# Patient Record
Sex: Male | Born: 1955 | Race: White | Hispanic: No | Marital: Married | Smoking: Former smoker
Health system: Southern US, Community
[De-identification: ages and names within clinical notes are randomized; demographics above are authoritative.]

## PROBLEM LIST (undated history)

## (undated) DIAGNOSIS — A6920 Lyme disease, unspecified: Secondary | ICD-10-CM

## (undated) DIAGNOSIS — K589 Irritable bowel syndrome without diarrhea: Secondary | ICD-10-CM

## (undated) DIAGNOSIS — I499 Cardiac arrhythmia, unspecified: Secondary | ICD-10-CM

## (undated) DIAGNOSIS — J45909 Unspecified asthma, uncomplicated: Secondary | ICD-10-CM

## (undated) DIAGNOSIS — E785 Hyperlipidemia, unspecified: Secondary | ICD-10-CM

## (undated) DIAGNOSIS — E559 Vitamin D deficiency, unspecified: Secondary | ICD-10-CM

## (undated) DIAGNOSIS — N419 Inflammatory disease of prostate, unspecified: Secondary | ICD-10-CM

## (undated) DIAGNOSIS — F419 Anxiety disorder, unspecified: Secondary | ICD-10-CM

## (undated) DIAGNOSIS — N411 Chronic prostatitis: Secondary | ICD-10-CM

## (undated) DIAGNOSIS — I10 Essential (primary) hypertension: Secondary | ICD-10-CM

## (undated) DIAGNOSIS — M5412 Radiculopathy, cervical region: Secondary | ICD-10-CM

## (undated) DIAGNOSIS — K648 Other hemorrhoids: Secondary | ICD-10-CM

## (undated) DIAGNOSIS — I2510 Atherosclerotic heart disease of native coronary artery without angina pectoris: Secondary | ICD-10-CM

## (undated) DIAGNOSIS — C801 Malignant (primary) neoplasm, unspecified: Secondary | ICD-10-CM

## (undated) DIAGNOSIS — F319 Bipolar disorder, unspecified: Secondary | ICD-10-CM

## (undated) DIAGNOSIS — B009 Herpesviral infection, unspecified: Secondary | ICD-10-CM

## (undated) DIAGNOSIS — F329 Major depressive disorder, single episode, unspecified: Secondary | ICD-10-CM

## (undated) DIAGNOSIS — G4733 Obstructive sleep apnea (adult) (pediatric): Secondary | ICD-10-CM

## (undated) DIAGNOSIS — K5790 Diverticulosis of intestine, part unspecified, without perforation or abscess without bleeding: Secondary | ICD-10-CM

## (undated) DIAGNOSIS — T4145XA Adverse effect of unspecified anesthetic, initial encounter: Secondary | ICD-10-CM

## (undated) DIAGNOSIS — G9332 Myalgic encephalomyelitis/chronic fatigue syndrome: Secondary | ICD-10-CM

## (undated) DIAGNOSIS — R0789 Other chest pain: Secondary | ICD-10-CM

## (undated) DIAGNOSIS — H9190 Unspecified hearing loss, unspecified ear: Secondary | ICD-10-CM

## (undated) DIAGNOSIS — Z8739 Personal history of other diseases of the musculoskeletal system and connective tissue: Secondary | ICD-10-CM

## (undated) DIAGNOSIS — G4730 Sleep apnea, unspecified: Secondary | ICD-10-CM

## (undated) DIAGNOSIS — Z86010 Personal history of colonic polyps: Secondary | ICD-10-CM

## (undated) DIAGNOSIS — R519 Headache, unspecified: Secondary | ICD-10-CM

## (undated) DIAGNOSIS — R011 Cardiac murmur, unspecified: Secondary | ICD-10-CM

## (undated) DIAGNOSIS — Z9889 Other specified postprocedural states: Secondary | ICD-10-CM

## (undated) DIAGNOSIS — J309 Allergic rhinitis, unspecified: Secondary | ICD-10-CM

## (undated) DIAGNOSIS — R55 Syncope and collapse: Secondary | ICD-10-CM

## (undated) DIAGNOSIS — E7849 Other hyperlipidemia: Secondary | ICD-10-CM

## (undated) DIAGNOSIS — F32A Depression, unspecified: Secondary | ICD-10-CM

## (undated) DIAGNOSIS — T8859XA Other complications of anesthesia, initial encounter: Secondary | ICD-10-CM

## (undated) DIAGNOSIS — M436 Torticollis: Secondary | ICD-10-CM

## (undated) DIAGNOSIS — R112 Nausea with vomiting, unspecified: Secondary | ICD-10-CM

## (undated) DIAGNOSIS — K219 Gastro-esophageal reflux disease without esophagitis: Secondary | ICD-10-CM

## (undated) DIAGNOSIS — E291 Testicular hypofunction: Secondary | ICD-10-CM

## (undated) DIAGNOSIS — E7800 Pure hypercholesterolemia, unspecified: Secondary | ICD-10-CM

## (undated) DIAGNOSIS — G8929 Other chronic pain: Secondary | ICD-10-CM

## (undated) DIAGNOSIS — K644 Residual hemorrhoidal skin tags: Secondary | ICD-10-CM

## (undated) DIAGNOSIS — R5382 Chronic fatigue, unspecified: Secondary | ICD-10-CM

## (undated) DIAGNOSIS — T7840XA Allergy, unspecified, initial encounter: Secondary | ICD-10-CM

## (undated) DIAGNOSIS — M1990 Unspecified osteoarthritis, unspecified site: Secondary | ICD-10-CM

## (undated) DIAGNOSIS — R51 Headache: Secondary | ICD-10-CM

## (undated) DIAGNOSIS — D126 Benign neoplasm of colon, unspecified: Secondary | ICD-10-CM

## (undated) HISTORY — DX: Headache, unspecified: R51.9

## (undated) HISTORY — DX: Other chest pain: R07.89

## (undated) HISTORY — DX: Anxiety disorder, unspecified: F41.9

## (undated) HISTORY — DX: Irritable bowel syndrome, unspecified: K58.9

## (undated) HISTORY — PX: UPPER GASTROINTESTINAL ENDOSCOPY: SHX188

## (undated) HISTORY — DX: Atherosclerotic heart disease of native coronary artery without angina pectoris: I25.10

## (undated) HISTORY — DX: Other hyperlipidemia: E78.49

## (undated) HISTORY — DX: Inflammatory disease of prostate, unspecified: N41.9

## (undated) HISTORY — DX: Benign neoplasm of colon, unspecified: D12.6

## (undated) HISTORY — DX: Headache: R51

## (undated) HISTORY — DX: Unspecified osteoarthritis, unspecified site: M19.90

## (undated) HISTORY — DX: Chronic prostatitis: N41.1

## (undated) HISTORY — DX: Diverticulosis of intestine, part unspecified, without perforation or abscess without bleeding: K57.90

## (undated) HISTORY — DX: Other chronic pain: G89.29

## (undated) HISTORY — PX: TENNIS ELBOW RELEASE/NIRSCHEL PROCEDURE: SHX6651

## (undated) HISTORY — DX: Testicular hypofunction: E29.1

## (undated) HISTORY — DX: Herpesviral infection, unspecified: B00.9

## (undated) HISTORY — DX: Residual hemorrhoidal skin tags: K64.4

## (undated) HISTORY — DX: Sleep apnea, unspecified: G47.30

## (undated) HISTORY — DX: Depression, unspecified: F32.A

## (undated) HISTORY — PX: ESOPHAGOGASTRODUODENOSCOPY: SHX1529

## (undated) HISTORY — DX: Other hemorrhoids: K64.8

## (undated) HISTORY — DX: Allergic rhinitis, unspecified: J30.9

## (undated) HISTORY — DX: Lyme disease, unspecified: A69.20

## (undated) HISTORY — DX: Major depressive disorder, single episode, unspecified: F32.9

## (undated) HISTORY — DX: Unspecified asthma, uncomplicated: J45.909

## (undated) HISTORY — DX: Vitamin D deficiency, unspecified: E55.9

## (undated) HISTORY — DX: Radiculopathy, cervical region: M54.12

## (undated) HISTORY — DX: Gastro-esophageal reflux disease without esophagitis: K21.9

## (undated) HISTORY — DX: Chronic fatigue, unspecified: R53.82

## (undated) HISTORY — DX: Cardiac murmur, unspecified: R01.1

## (undated) HISTORY — DX: Nausea with vomiting, unspecified: R11.2

## (undated) HISTORY — PX: NASAL POLYP EXCISION: SHX2068

## (undated) HISTORY — DX: Essential (primary) hypertension: I10

## (undated) HISTORY — DX: Other specified postprocedural states: R11.2

## (undated) HISTORY — DX: Obstructive sleep apnea (adult) (pediatric): G47.33

## (undated) HISTORY — PX: BACK SURGERY: SHX140

## (undated) HISTORY — PX: COLONOSCOPY: SHX174

## (undated) HISTORY — PX: TONSILLECTOMY: SUR1361

## (undated) HISTORY — PX: MEDIAL PARTIAL KNEE REPLACEMENT: SHX5965

## (undated) HISTORY — DX: Allergy, unspecified, initial encounter: T78.40XA

## (undated) HISTORY — DX: Myalgic encephalomyelitis/chronic fatigue syndrome: G93.32

## (undated) HISTORY — DX: Bipolar disorder, unspecified: F31.9

## (undated) HISTORY — DX: Personal history of other diseases of the musculoskeletal system and connective tissue: Z87.39

## (undated) HISTORY — DX: Personal history of colonic polyps: Z86.010

## (undated) HISTORY — DX: Pure hypercholesterolemia, unspecified: E78.00

## (undated) HISTORY — DX: Syncope and collapse: R55

## (undated) HISTORY — DX: Other specified postprocedural states: Z98.890

## (undated) HISTORY — PX: CARDIAC CATHETERIZATION: SHX172

## (undated) HISTORY — PX: COLONOSCOPY W/ BIOPSIES: SHX1374

## (undated) HISTORY — PX: POLYPECTOMY: SHX149

## (undated) HISTORY — PX: HEMORRHOID BANDING: SHX5850

## (undated) HISTORY — DX: Hyperlipidemia, unspecified: E78.5

---
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
## 1988-03-07 HISTORY — PX: UVULOPALATOPHARYNGOPLASTY: SHX827

## 1992-09-24 ENCOUNTER — Encounter (INDEPENDENT_AMBULATORY_CARE_PROVIDER_SITE_OTHER): Payer: Self-pay | Admitting: Gastroenterology

## 1997-07-17 ENCOUNTER — Other Ambulatory Visit: Admission: RE | Admit: 1997-07-17 | Discharge: 1997-07-17 | Payer: Self-pay | Admitting: *Deleted

## 1997-11-18 ENCOUNTER — Ambulatory Visit: Admission: RE | Admit: 1997-11-18 | Discharge: 1997-11-18 | Payer: Self-pay | Admitting: *Deleted

## 1998-06-11 ENCOUNTER — Observation Stay (HOSPITAL_COMMUNITY): Admission: EM | Admit: 1998-06-11 | Discharge: 1998-06-12 | Payer: Self-pay | Admitting: Emergency Medicine

## 1998-09-24 ENCOUNTER — Inpatient Hospital Stay (HOSPITAL_COMMUNITY): Admission: AD | Admit: 1998-09-24 | Discharge: 1998-09-25 | Payer: Self-pay | Admitting: *Deleted

## 1998-11-24 ENCOUNTER — Ambulatory Visit: Admission: RE | Admit: 1998-11-24 | Discharge: 1998-11-24 | Payer: Self-pay | Admitting: *Deleted

## 1999-03-08 DIAGNOSIS — D126 Benign neoplasm of colon, unspecified: Secondary | ICD-10-CM

## 1999-03-08 HISTORY — DX: Benign neoplasm of colon, unspecified: D12.6

## 1999-04-01 ENCOUNTER — Encounter (INDEPENDENT_AMBULATORY_CARE_PROVIDER_SITE_OTHER): Payer: Self-pay | Admitting: Specialist

## 1999-04-01 ENCOUNTER — Ambulatory Visit (HOSPITAL_COMMUNITY): Admission: RE | Admit: 1999-04-01 | Discharge: 1999-04-01 | Payer: Self-pay | Admitting: Gastroenterology

## 2002-12-03 ENCOUNTER — Encounter: Admission: RE | Admit: 2002-12-03 | Discharge: 2002-12-03 | Payer: Self-pay | Admitting: Otolaryngology

## 2002-12-03 ENCOUNTER — Encounter: Payer: Self-pay | Admitting: Otolaryngology

## 2003-11-21 ENCOUNTER — Encounter: Admission: RE | Admit: 2003-11-21 | Discharge: 2003-11-21 | Payer: Self-pay | Admitting: Internal Medicine

## 2003-12-16 ENCOUNTER — Encounter: Admission: RE | Admit: 2003-12-16 | Discharge: 2003-12-16 | Payer: Self-pay | Admitting: Otolaryngology

## 2004-08-06 ENCOUNTER — Ambulatory Visit: Payer: Self-pay | Admitting: Gastroenterology

## 2004-08-17 ENCOUNTER — Ambulatory Visit: Payer: Self-pay | Admitting: Gastroenterology

## 2004-08-17 ENCOUNTER — Encounter (INDEPENDENT_AMBULATORY_CARE_PROVIDER_SITE_OTHER): Payer: Self-pay | Admitting: Specialist

## 2006-12-04 ENCOUNTER — Ambulatory Visit: Payer: Self-pay | Admitting: Internal Medicine

## 2007-05-18 DIAGNOSIS — E785 Hyperlipidemia, unspecified: Secondary | ICD-10-CM

## 2007-05-18 DIAGNOSIS — F3289 Other specified depressive episodes: Secondary | ICD-10-CM

## 2007-05-18 DIAGNOSIS — J309 Allergic rhinitis, unspecified: Secondary | ICD-10-CM | POA: Insufficient documentation

## 2007-05-18 DIAGNOSIS — K219 Gastro-esophageal reflux disease without esophagitis: Secondary | ICD-10-CM | POA: Insufficient documentation

## 2007-05-18 DIAGNOSIS — Z860100 Personal history of colon polyps, unspecified: Secondary | ICD-10-CM

## 2007-05-18 DIAGNOSIS — R519 Headache, unspecified: Secondary | ICD-10-CM | POA: Insufficient documentation

## 2007-05-18 DIAGNOSIS — K589 Irritable bowel syndrome without diarrhea: Secondary | ICD-10-CM

## 2007-05-18 DIAGNOSIS — F329 Major depressive disorder, single episode, unspecified: Secondary | ICD-10-CM | POA: Insufficient documentation

## 2007-05-18 DIAGNOSIS — R51 Headache: Secondary | ICD-10-CM

## 2007-05-18 DIAGNOSIS — J45909 Unspecified asthma, uncomplicated: Secondary | ICD-10-CM

## 2007-05-18 DIAGNOSIS — N411 Chronic prostatitis: Secondary | ICD-10-CM

## 2007-05-18 HISTORY — DX: Hyperlipidemia, unspecified: E78.5

## 2007-05-18 HISTORY — DX: Gastro-esophageal reflux disease without esophagitis: K21.9

## 2007-05-18 HISTORY — DX: Personal history of colonic polyps: Z86.010

## 2007-05-18 HISTORY — DX: Personal history of colon polyps, unspecified: Z86.0100

## 2007-05-18 HISTORY — DX: Unspecified asthma, uncomplicated: J45.909

## 2007-05-18 HISTORY — DX: Chronic prostatitis: N41.1

## 2007-05-18 HISTORY — DX: Headache: R51

## 2007-05-18 HISTORY — DX: Allergic rhinitis, unspecified: J30.9

## 2007-05-18 HISTORY — DX: Irritable bowel syndrome, unspecified: K58.9

## 2007-05-18 HISTORY — DX: Other specified depressive episodes: F32.89

## 2007-05-18 HISTORY — DX: Major depressive disorder, single episode, unspecified: F32.9

## 2007-10-16 ENCOUNTER — Ambulatory Visit: Payer: Self-pay | Admitting: Internal Medicine

## 2007-12-11 ENCOUNTER — Ambulatory Visit: Payer: Self-pay | Admitting: Internal Medicine

## 2007-12-27 ENCOUNTER — Ambulatory Visit: Payer: Self-pay | Admitting: Internal Medicine

## 2007-12-27 ENCOUNTER — Encounter: Payer: Self-pay | Admitting: Internal Medicine

## 2007-12-28 ENCOUNTER — Telehealth: Payer: Self-pay | Admitting: Internal Medicine

## 2007-12-30 ENCOUNTER — Encounter: Payer: Self-pay | Admitting: Internal Medicine

## 2008-05-05 DIAGNOSIS — R55 Syncope and collapse: Secondary | ICD-10-CM

## 2008-05-05 HISTORY — DX: Syncope and collapse: R55

## 2008-05-05 HISTORY — PX: LEFT HEART CATH AND CORONARY ANGIOGRAPHY: CATH118249

## 2008-05-05 HISTORY — PX: CARDIAC CATHETERIZATION: SHX172

## 2008-05-14 ENCOUNTER — Inpatient Hospital Stay (HOSPITAL_BASED_OUTPATIENT_CLINIC_OR_DEPARTMENT_OTHER): Admission: RE | Admit: 2008-05-14 | Discharge: 2008-05-14 | Payer: Self-pay | Admitting: Cardiovascular Disease

## 2008-08-14 ENCOUNTER — Encounter: Payer: Self-pay | Admitting: Internal Medicine

## 2009-09-04 HISTORY — PX: TRANSTHORACIC ECHOCARDIOGRAM: SHX275

## 2010-03-24 ENCOUNTER — Ambulatory Visit: Payer: Self-pay | Admitting: Cardiovascular Disease

## 2010-03-28 ENCOUNTER — Encounter: Payer: Self-pay | Admitting: Internal Medicine

## 2010-05-27 ENCOUNTER — Encounter: Payer: Self-pay | Admitting: Internal Medicine

## 2010-06-25 ENCOUNTER — Other Ambulatory Visit: Payer: Self-pay | Admitting: *Deleted

## 2010-06-25 DIAGNOSIS — E7800 Pure hypercholesterolemia, unspecified: Secondary | ICD-10-CM

## 2010-07-07 ENCOUNTER — Encounter: Payer: Self-pay | Admitting: Cardiovascular Disease

## 2010-07-07 DIAGNOSIS — E785 Hyperlipidemia, unspecified: Secondary | ICD-10-CM | POA: Insufficient documentation

## 2010-07-07 DIAGNOSIS — E7800 Pure hypercholesterolemia, unspecified: Secondary | ICD-10-CM | POA: Insufficient documentation

## 2010-07-07 DIAGNOSIS — R55 Syncope and collapse: Secondary | ICD-10-CM | POA: Insufficient documentation

## 2010-07-07 DIAGNOSIS — F319 Bipolar disorder, unspecified: Secondary | ICD-10-CM | POA: Insufficient documentation

## 2010-07-07 DIAGNOSIS — G4730 Sleep apnea, unspecified: Secondary | ICD-10-CM | POA: Insufficient documentation

## 2010-07-07 DIAGNOSIS — R0789 Other chest pain: Secondary | ICD-10-CM | POA: Insufficient documentation

## 2010-07-14 ENCOUNTER — Ambulatory Visit (INDEPENDENT_AMBULATORY_CARE_PROVIDER_SITE_OTHER): Payer: BC Managed Care – PPO | Admitting: Cardiovascular Disease

## 2010-07-14 ENCOUNTER — Other Ambulatory Visit (INDEPENDENT_AMBULATORY_CARE_PROVIDER_SITE_OTHER): Payer: BC Managed Care – PPO | Admitting: *Deleted

## 2010-07-14 ENCOUNTER — Encounter: Payer: Self-pay | Admitting: Cardiovascular Disease

## 2010-07-14 ENCOUNTER — Telehealth: Payer: Self-pay | Admitting: Cardiovascular Disease

## 2010-07-14 VITALS — BP 122/82 | HR 70 | Wt 211.0 lb

## 2010-07-14 DIAGNOSIS — E785 Hyperlipidemia, unspecified: Secondary | ICD-10-CM

## 2010-07-14 DIAGNOSIS — E7800 Pure hypercholesterolemia, unspecified: Secondary | ICD-10-CM

## 2010-07-14 NOTE — Telephone Encounter (Signed)
He told me as he was checking out today that he is no longer on the VYVANSE.

## 2010-07-14 NOTE — Telephone Encounter (Signed)
Med list corrected. Alfonso Ramus RN

## 2010-07-14 NOTE — Assessment & Plan Note (Signed)
He recently had some lab work at Dr. Alver Fisher office. We'll have Dr. Clelia Croft fax these levels to [place]. I'll see [name] again in one year.

## 2010-07-14 NOTE — Progress Notes (Signed)
Aaron Stout")  Date of Birth  1955-04-22 [facility] [address]     [address] [place], [place]  [address] [phone]  Fax  [phone]  History of Present Illness:  Feeling well. No chest pain or dyspnea.  Feels fatigued.  Current Outpatient Prescriptions on File Prior to Visit  Medication Sig Dispense Refill  . acyclovir (ZOVIRAX) 400 MG tablet Take 400 mg by mouth 2 (two) times daily.        Marland Kitchen aspirin 325 MG tablet Take 325 mg by mouth daily.       Marland Kitchen atorvastatin (LIPITOR) 20 MG tablet Take 20 mg by mouth daily.        Marland Kitchen azelastine (ASTELIN) 137 MCG/SPRAY nasal spray 1 spray by Nasal route 2 (two) times daily. Use in each nostril as directed       . fexofenadine (ALLEGRA) 180 MG tablet Take 180 mg by mouth daily.        . fish oil-omega-3 fatty acids 1000 MG capsule Take 1,200 mg by mouth 2 (two) times daily. 2 BID       . fluticasone (FLONASE) 50 MCG/ACT nasal spray 2 sprays by Nasal route 2 (two) times daily.        . Multiple Vitamin (MULTIVITAMIN) tablet Take 1 tablet by mouth daily.        Marland Kitchen omeprazole-sodium bicarbonate (ZEGERID) 40-1100 MG per capsule Take 1 capsule by mouth 2 (two) times daily.        Marland Kitchen PERPHENAZINE PO Take 8 mg by mouth daily.       . QUEtiapine (SEROQUEL) 50 MG tablet Take 50 mg by mouth at bedtime.        Marland Kitchen lisdexamfetamine (VYVANSE) 60 MG capsule Take 60 mg by mouth every morning.        . simvastatin (ZOCOR) 20 MG tablet Take 20 mg by mouth at bedtime.          Allergies  Allergen Reactions  . Ibuprofen     REACTION: hives  . Penicillins     REACTION: unknown    Past Medical History  Diagnosis Date  . Syncope   . Coronary artery disease   . Bipolar 1 disorder   . Sleep apnea   . Hypercholesterolemia     Past Surgical History  Procedure Date  . Cardiac catheterization 05/2008    History  Smoking status  . Former Smoker  . Quit date: 07/06/1980  Smokeless tobacco  . Not on  file    History  Alcohol Use No    Family History  Problem Relation Age of Onset  . Heart attack Mother 63  . Heart failure Father 37    Reviw of Systems:  Reviewed in the HPI.  All other systems are negative.  Physical Exam: BP 122/82  Pulse 70  Wt 211 lb (95.709 kg) The patient is alert and oriented x 3.  The mood and affect are normal.  The skin is warm and dry.  Color is normal.  The HEENT exam reveals that the sclera are nonicteric.  The mucous membranes are moist.  The carotids are 2+ without bruits.  There is no thyromegaly.  There is no JVD.  The lungs are clear.  The chest wall is non tender.  The heart exam reveals a regular rate with a normal S1 and S2.  There are no murmurs, gallops, or rubs.  The PMI is not displaced.   Abdominal exam reveals  good bowel sounds.  There is no guarding or rebound.  There is no hepatosplenomegaly or tenderness.  There are no masses.  Exam of the legs reveal no clubbing, cyanosis, or edema.  The legs are without rashes.  The distal pulses are intact.  Cranial nerves II - XII are intact.  Motor and sensory functions are intact.  The gait is normal.  Assessment / Plan:

## 2010-07-20 NOTE — Cardiovascular Report (Signed)
NAMEOLE, LAFON               ACCOUNT NO.:  [id number]   MEDICAL RECORD NO.:  [id number]          PATIENT TYPE:  OIB   LOCATION:  1966                         FACILITY:  [facility]   PHYSICIAN:  Vesta Mixer, M.D. DATE OF BIRTH:  Oct 18, 1955   DATE OF PROCEDURE:  05/14/2008  DATE OF DISCHARGE:                            CARDIAC CATHETERIZATION   INDICATIONS:  Aaron Stout is a 55 year old gentleman with a history of  syncope.  He also has had some palpitations.  We recently performed a  stress echocardiogram which revealed an abnormal distal septal segment.  It was thought that he may have a distal LAD stenosis.  He had prolonged  episodes of chest burning following the stress test and he was scheduled  for heart catheterization.   PROCEDURE:  Left heart catheterization with coronary angiography.   The right femoral artery was easily cannulated using the modified  Seldinger technique.   HEMODYNAMICS:  LV pressure is 117/13.  His aortic pressure is 108/67.  The mean aortic valve gradient is 7 mmHg.   ANGIOGRAPHY:  Left main:  The left main is smooth and normal.   The left anterior descending artery is fairly normal.  There is a large  first diagonal artery that originates fairly high on the LAD.  It might  be considered a ramus intermediate vessel.  This diagonal vessel is  normal.   The entire LAD is fairly normal and reaches across the inferior apex and  supplies the distal inferior apical wall.   The left circumflex artery is a moderate-to-large sized vessel.  There  are minor luminal irregularities.   The right coronary artery is large and dominant.  The posterior  descending artery is fairly normal.  The posterolateral segment arteries  are normal.   The left ventriculogram was performed in a 30 RAO position.  It reveals  overall normal left ventricular systolic function.  The left ventricle  is fairly long and there is a fairly prominent apical knuckle.  This  extension of the apex seems to contract normally but probably was  identified as an abnormal wall motion, since it does extend a little bit  past where one would expect.  It certainly contracts normally and there  is no evidence for infarction or an aneurysm.   COMPLICATIONS:  None.   CONCLUSIONS:  1. Minor coronary artery irregularities.  2. Normal left ventricular systolic function with a very mild aortic      valve gradient of 7 mm (mean gradient).  We will continue with      medical therapy.      Vesta Mixer, M.D.  Electronically Signed     PJN/MEDQ  D:  05/14/2008  T:  05/14/2008  Job:  [id number]   cc:   Kari Baars, M.D.

## 2010-07-20 NOTE — H&P (Signed)
NAME:  MALCOME, AMBROCIO NO.:  [id number]   MEDICAL RECORD NO.:  [id number]           PATIENT TYPE:   LOCATION:                                 FACILITY:   PHYSICIAN:  Vesta Mixer, M.D.      DATE OF BIRTH:   DATE OF ADMISSION:  DATE OF DISCHARGE:                              HISTORY & PHYSICAL   Aaron Stout is a middle-aged gentleman who is now admitted to  the hospital after having episodes of weakness and syncope and an  abnormal stress test.  He is admitted for heart catheterization.   Aaron Stout is a 55 year old gentleman who was seen in the office for a  consultation recently.  He had been having some episodes of syncope and  presyncope.  He recently had a stress echocardiogram which revealed an  inducible anteroapical defect.  Following this, he had a little bit of  chest burning.  We gave him a nitroglycerin which unfortunately dropped  his blood pressure and caused him to feel weak and dizzy for the next  hour.  He slowly recovered and eventually was able to go home.  He was  brought back today to discuss these results and also to arrange for  heart catheterization.   Aaron Stout has a history of heart catheterization in the past and had a  moderate 70% narrowing reported to him in 1997.  He remembers that it  was on the back side of his heart.   He has a history of hypercholesterolemia and sleep apnea.   He has had some episodes of chest burning recently.  He has also had  these unusual episodes of weakness and dizziness.  He has not had any  episodes of syncope since we started our workup.   CURRENT MEDICATIONS:  1. Seroquel 50 mg a day.  2. Nuvigil 250 mg a day.  3. Cytomel once a day.  4. Acyclovir 400 mg a day.  5. Zegrid 40 mg a day.  6. Neurontin 1200 mg twice a day.  7. Allegra once a day.  8. Zocor 20 mg a day.  9. Perphenazine 12 mg a day.  10.Astelin twice a day.  11.Flonase twice a day.  12.Vitamins once a day.  13.Lamictal once a day.  14.Fish oil once a day.  15.Aspirin 81 mg a day.  16.Multivitamin once a day.   ALLERGIES:  He is allergic to PENICILLIN and IBUPROFEN.   PAST MEDICAL HISTORY:  1. Bipolar disease.  2. Sleep apnea.  3. Mild-to-moderate coronary artery disease by heart catheterization      in 1997.  4. Hypercholesterolemia.  5. History of back surgery.   FAMILY HISTORY:  His father died at age 14 due to problems with  congestive heart failure.  His mother is 58 and has had a myocardial  infarction.   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  He  does not drink alcohol.  He used to smoke but quit 30 years ago.   REVIEW OF SYSTEMS:  As reviewed in the history of present illness.  All  other systems are reviewed and are negative.   PHYSICAL EXAMINATION:  GENERAL:  He is a middle-aged gentleman in no  acute distress.  He is alert and oriented x3 and his mood and affect are  normal.  VITAL SIGNS:  His weight is 204, blood pressure is 102/74 with heart  rate of 66.  HEENT:  Reveals 2+ pupils carotids.  NECK:  He has no bruits, no JVD, and no thyromegaly.  His neck is  supple.  LUNGS:  Clear.  BACK:  Nontender.  HEART:  Regular rate, S1-S2.  He has no murmurs.  PMI is nondisplaced.  ABDOMEN:  Good bowel sounds and is nontender.  EXTREMITIES:  He has no clubbing, cyanosis, or edema.  NEUROLOGIC:  Nonfocal.   LABORATORY DATA:  His EKG reveals normal sinus rhythm/sinus bradycardia.  He has no ST or T-wave changes.   IMPRESSION AND PLAN:  Aaron Stout presents with some episodes of syncope.  He  had an abnormal stress echocardiogram which reveals a small area of  distal, septal, and apical akinesis with exercise.  It is quite possible  that the echo probe a little bit out of plan and that this abnormality  may just be due to the imaging.  Given all his symptoms and the fact  that it has been so difficult to pull out all of his symptoms together  in 1  unit by problem, I think that we should proceed with heart  catheterization.  He has known coronary artery disease from several  years ago.  We have discussed the risks, benefits, and options of heart  catheterization.  He understands and agrees to proceed.      Vesta Mixer, M.D.  Electronically Signed     PJN/MEDQ  D:  05/12/2008  T:  05/13/2008  Job:  [id number]   cc:   Kari Baars, M.D.

## 2010-07-20 NOTE — Assessment & Plan Note (Signed)
[facility]                         GASTROENTEROLOGY OFFICE NOTE   NAME:Lumadue, ERMA JOUBERT                      MRN:          [id number]  DATE:12/04/2006                            DOB:          [birth year]    CHIEF COMPLAINT:  Followup on gastritis, reflux, irritable bowel, and  colitis.   HISTORY:  A 55 year old white man, previously followed by Dr. Victorino Dike.  He has chronic reflux problems, usually well controlled on  Zegerid b.i.d.  Also a history of colon polyps and hemorrhoids.  There  has been some question of mild underlying colitis versus irritable  bowel, though it looks like subsequent biopsies did not reveal any  colitis, and he is not on any particular therapy for that at this time.  He would like a refill of his Zegerid as well.  There is no dysphagia.   His occasionally has some crampy abdominal pain, gas, and bloating, and  diarrhea problems, though not currently.  His hemorrhoids swell and  bleed and are painful at times, though that is not an active problem.  His primary care physician is Eric Form, he also sees Dr. [name] for chronic prostatitis as well as sees Dr. Meredith Staggers.   MEDICATIONS:  1. Zegerid 40 mg b.i.d.  2. Acyclovir 400 mg b.i.d.  3. Neurontin 1200 mg b.i.d.  4. Flonase daily.  5. Astelin daily.  6. Zoloft daily.  7. Lamictal 300 mg daily.  8. Cipro daily.  9. Flomax.  10.Seroquel 50 mg at bedtime.  11.Allegra 180 mg daily.   ALLERGIES:  IBUPROFEN and PENICILLIN.   PAST MEDICAL HISTORY:  1. Gastroesophageal reflux disease.  2. Adenomatous colon polyps.  Last colonoscopy with multiple      diminutive polyps, though adenomas found, June 2006.  3. Internal hemorrhoids.  4. Irritable bowel syndrome.  5. Asthma.  6. Dyslipidemia.  7. Chronic prostatitis.  8. Chronic headaches.  9. Allergies and sinus problems.  10.Depression, on Lamictal.  11.Sleep apnea, status post surgery late 1990s.  12.C-spine radiculopathy.  13.Nasal polyp resection.  14.Hand surgery 1971.  15.Back surgery 1975 and 1985.  16.On chronic acyclovir for reasons not clear to me.   FAMILY HISTORY:  Alcoholism in great grandfather and uncle.  Heart  disease in uncle and grandfather.  Colon polyps in an uncle and some  inflammatory bowel disease is 2nd degree relatives as well.   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  Specializes in [employer].  He has 2 sons.  He lives with his wife and immediate family.  No alcohol, tobacco, or  drugs.   REVIEW OF SYSTEMS:  See my medical history form for full details.   PHYSICAL EXAMINATION:  Height 6 feet 1-3/4 inches, weight 221 pounds,  blood pressure 118/62, pulse 68.  MOUTH:  Posterior pharynx free of lesions.  NECK:  Supple, no thyromegaly or mass.  The eyes are anicteric.  LUNGS:  Clear.  HEART:  S1, S2, no murmurs, rubs, or gallops.  ABDOMEN:  Soft, nontender, no  organomegaly.  PSYCHE:  He has an appropriate affect and orientation.   ASSESSMENT:  1. Gastroesophageal reflux disease requiring b.i.d. proton pump      inhibitor.  2. Irritable bowel syndrome.  3. History of adenomatous colon polyps.   PLAN:  1. Change his colonoscopy recall to June of 2009.  He had multiple      polyps, some of which were adenomatous, and I think a 3-year      followup and not a 5-year followup makes sense given the findings.      This is discussed with the patient.  2. Zegerid prescription 40 mg b.i.d. prescribed.  3. Florastor 250 mg daily.  This is in probiotic yeast formulation      that may help with his gas and bloating.  Perhaps the chronic Cipro      is causing some issues there, though he has clear-cut underlying      irritable bowel.   See me as needed otherwise.     Iva Boop, MD,FACG  Electronically Signed    CEG/MedQ  DD: 12/05/2006  DT: 12/05/2006  Job #: [id number]   cc:   Kari Baars, M.D.  Jetty Duhamel., M.D.  Sigmund I. Patsi Sears, M.D.

## 2011-03-08 DIAGNOSIS — M1990 Unspecified osteoarthritis, unspecified site: Secondary | ICD-10-CM

## 2011-03-08 HISTORY — DX: Unspecified osteoarthritis, unspecified site: M19.90

## 2011-03-22 ENCOUNTER — Other Ambulatory Visit: Payer: Self-pay | Admitting: *Deleted

## 2011-03-22 MED ORDER — ATORVASTATIN CALCIUM 20 MG PO TABS: 20.0000 mg | ORAL_TABLET | Freq: Every day | ORAL | Status: DC

## 2011-03-22 NOTE — Telephone Encounter (Signed)
Fax Received. Refill Completed. Aaron Stout (R.M.A)   

## 2011-03-24 ENCOUNTER — Other Ambulatory Visit: Payer: Self-pay | Admitting: *Deleted

## 2011-03-24 MED ORDER — ATORVASTATIN CALCIUM 20 MG PO TABS: 20.0000 mg | ORAL_TABLET | Freq: Every day | ORAL | Status: DC

## 2011-03-24 NOTE — Telephone Encounter (Signed)
Fax Received. Refill Completed. Aaron Stout (R.M.A)   

## 2011-07-18 ENCOUNTER — Ambulatory Visit (INDEPENDENT_AMBULATORY_CARE_PROVIDER_SITE_OTHER): Payer: BC Managed Care – PPO | Admitting: Cardiovascular Disease

## 2011-07-18 ENCOUNTER — Encounter: Payer: Self-pay | Admitting: Cardiovascular Disease

## 2011-07-18 VITALS — BP 128/84 | HR 88 | Ht 74.0 in | Wt 232.4 lb

## 2011-07-18 DIAGNOSIS — E785 Hyperlipidemia, unspecified: Secondary | ICD-10-CM

## 2011-07-18 DIAGNOSIS — E7800 Pure hypercholesterolemia, unspecified: Secondary | ICD-10-CM

## 2011-07-18 DIAGNOSIS — R55 Syncope and collapse: Secondary | ICD-10-CM

## 2011-07-18 DIAGNOSIS — I2510 Atherosclerotic heart disease of native coronary artery without angina pectoris: Secondary | ICD-10-CM

## 2011-07-18 LAB — HEPATIC FUNCTION PANEL
ALT: 38 U/L (ref 0–53)
AST: 26 U/L (ref 0–37)
Albumin: 3.9 g/dL (ref 3.5–5.2)
Alkaline Phosphatase: 36 U/L — ABNORMAL LOW (ref 39–117)
Bilirubin, Direct: 0 mg/dL (ref 0.0–0.3)
Total Bilirubin: 0.6 mg/dL (ref 0.3–1.2)
Total Protein: 7.4 g/dL (ref 6.0–8.3)

## 2011-07-18 LAB — BASIC METABOLIC PANEL
BUN: 16 mg/dL (ref 6–23)
CO2: 24 mEq/L (ref 19–32)
Calcium: 9.3 mg/dL (ref 8.4–10.5)
Chloride: 99 mEq/L (ref 96–112)
Creatinine, Ser: 1 mg/dL (ref 0.4–1.5)
GFR: 81.16 mL/min (ref >60.00)
Glucose, Bld: 93 mg/dL (ref 70–99)
Potassium: 3.9 mEq/L (ref 3.5–5.1)
Sodium: 137 mEq/L (ref 135–145)

## 2011-07-18 LAB — LDL CHOLESTEROL, DIRECT: Direct LDL: 180.6 mg/dL

## 2011-07-18 LAB — LIPID PANEL
Cholesterol: 237 mg/dL — ABNORMAL HIGH (ref 0–200)
HDL: 50.7 mg/dL (ref >39.00)
Total CHOL/HDL Ratio: 5
Triglycerides: 124 mg/dL (ref 0.0–149.0)
VLDL: 24.8 mg/dL (ref 0.0–40.0)

## 2011-07-18 NOTE — Progress Notes (Signed)
Aaron Stout Date of Birth  1955-04-06       [facility]    [place] [address]  [address] [place], [place]  [address]   [place], [place]  [address] [phone]     [phone]   Fax  [phone]    Fax [phone]  Problem List: 1. Syncope 2. Mild to moderate coronary artery disease by heart catheterization 3. Bipolar disease 4. Sleep apnea 5. Hyperlipidemia- he tried  History of Present Illness:  Aaron Stout is doing well.  He denies any episodes of chest pain or shortness breath.  He's able to do his normal activities without any significant problems but he is not exercising on a regular basis.  He stop his atorvastatin several weeks ago because it seemed to exacerbate his trigger finger condition.  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  Current Outpatient Prescriptions on File Prior to Visit  Medication Sig Dispense Refill  . acyclovir (ZOVIRAX) 400 MG tablet Take 400 mg by mouth 2 (two) times daily.        Marland Kitchen amphetamine-dextroamphetamine (ADDERALL XR) 30 MG 24 hr capsule Take 60 mg by mouth every morning.        Marland Kitchen aspirin 325 MG tablet Take 325 mg by mouth daily.       Marland Kitchen azelastine (ASTELIN) 137 MCG/SPRAY nasal spray 1 spray by Nasal route 2 (two) times daily. Use in each nostril as directed       . Calcium Carbonate (CALCIUM 600 PO) Take by mouth.       . Cholecalciferol (VITAMIN D PO) Take by mouth.        Aaron Stout Calcium (STOOL SOFTENER PO) Take by mouth.        . fexofenadine (ALLEGRA) 180 MG tablet Take 180 mg by mouth daily.        . fish oil-omega-3 fatty acids 1000 MG capsule Take 1,200 mg by mouth 2 (two) times daily. 2 BID       . fluticasone (FLONASE) 50 MCG/ACT nasal spray 2 sprays by Nasal route 2 (two) times daily.        . Folic Acid-Vit B6-Vit B12 (FOLBEE PO) Take by mouth.        . Glucosamine-Chondroit-Vit C-Mn (GLUCOSAMINE 1500 COMPLEX  PO) Take by mouth.        . Multiple Vitamin (MULTIVITAMIN) tablet Take 1 tablet by mouth daily.        Marland Kitchen omeprazole-sodium bicarbonate (ZEGERID) 40-1100 MG per capsule Take 1 capsule by mouth 2 (two) times daily.        Marland Kitchen PERPHENAZINE PO Take 8 mg by mouth daily.       . QUEtiapine (SEROQUEL) 50 MG tablet Take 50 mg by mouth at bedtime.          Allergies  Allergen Reactions  . Ibuprofen     REACTION: hives  . Penicillins     REACTION: unknown    Past Medical History  Diagnosis Date  . Syncope   . Coronary artery disease   . Bipolar 1 disorder   . Sleep apnea   . Hypercholesterolemia     Past Surgical History  Procedure Date  . Cardiac catheterization 05/2008    History  Smoking status  . Former Smoker  . Quit date: 07/06/1980  Smokeless tobacco  . Not on file    History  Alcohol Use No  Family History  Problem Relation Age of Onset  . Heart attack Mother 26  . Heart failure Father 72    Reviw of Systems:  Reviewed in the HPI.  All other systems are negative.  Physical Exam: Blood pressure 128/84, pulse 88, height 6\' 2"  (1.88 m), weight 232 lb 6.4 oz (105.416 kg). General: Well developed, well nourished, in no acute distress.  Head: Normocephalic, atraumatic, sclera non-icteric, mucus membranes are moist,   Neck: Supple. Carotids are 2 + without bruits. No JVD  Lungs: Clear bilaterally to auscultation.  Heart: regular rate.  normal  S1 S2. No murmurs, gallops or rubs.  Abdomen: Soft, non-tender, non-distended with normal bowel sounds. No hepatomegaly. No rebound/guarding. No masses.  Msk:  Strength and tone are normal  Extremities: No clubbing or cyanosis. No edema.  Distal pedal pulses are 2+ and equal bilaterally.  Neuro: Alert and oriented X 3. Moves all extremities spontaneously.  Psych:  Responds to questions appropriately with a normal affect.  ECG: 07/18/2011-normal sinus rhythm at 88 beats a minute. He has left axis deviation. There  is a pulmonary disease pattern.  Assessment / Plan:

## 2011-07-18 NOTE — Assessment & Plan Note (Signed)
Aaron Stout is doing fairly well. We will check fasting labs on him today.  He did not tolerate atorvastatin because of hand pain.  The first and apparently caused worsening of his" trigger fingers".  We'll try other non-statin meds for cholesterol lowering.

## 2011-07-18 NOTE — Assessment & Plan Note (Signed)
Aaron Stout has a history of mild to moderate coronary artery disease. He's not having episodes of angina.  Unfortunately, he is not able to tolerate atorvastatin. It seemed to cause of worsening hand stiffness related to his trigger finger condition.  He stop the atorvastatin several months ago.  We'll recheck his fasting labs today. I've encouraged him to get more exercise.

## 2011-07-18 NOTE — Patient Instructions (Signed)
Your physician recommends that you return for a FASTING lipid profile: TODAY AND IN 1 YEAR   Your physician wants you to follow-up in: 1 YEAR  You will receive a reminder letter in the mail two months in advance. If you don't receive a letter, please call our office to schedule the follow-up appointment.   

## 2011-07-18 NOTE — Assessment & Plan Note (Signed)
He is unable to tolerate atorvastatin. We'll check fasting labs today.

## 2011-07-21 ENCOUNTER — Telehealth: Payer: Self-pay | Admitting: *Deleted

## 2011-07-21 DIAGNOSIS — E785 Hyperlipidemia, unspecified: Secondary | ICD-10-CM

## 2011-07-21 MED ORDER — PITAVASTATIN CALCIUM 2 MG PO TABS: 2.0000 mg | ORAL_TABLET | Freq: Every day | ORAL | Status: DC

## 2011-07-21 NOTE — Telephone Encounter (Signed)
Message copied by Antony Odea on Thu Jul 21, 2011  5:29 PM ------      Message from: Vesta Mixer      Created: Mon Jul 18, 2011  7:18 PM       He was unable to tolerate the atorvastatin. He developed severe hand pain. We'll try him on Livalo 2 mg a day.  Recheck fasting labs in 3 months.

## 2011-07-21 NOTE — Telephone Encounter (Signed)
Pt will try livalo 2 mg, 3 mo lab draw set,

## 2011-10-12 ENCOUNTER — Telehealth: Payer: Self-pay | Admitting: Cardiovascular Disease

## 2011-10-12 ENCOUNTER — Other Ambulatory Visit: Payer: BC Managed Care – PPO

## 2011-10-12 NOTE — Telephone Encounter (Signed)
ptcalled to [facility]xl appt for llabs, said just had done and would like for [place] to get results from dr Buren Kos [phone]

## 2011-10-13 NOTE — Telephone Encounter (Signed)
msg left with medical records to please send. All info left in msg

## 2011-10-14 ENCOUNTER — Encounter: Payer: Self-pay | Admitting: Cardiovascular Disease

## 2012-01-05 ENCOUNTER — Telehealth: Payer: Self-pay | Admitting: Internal Medicine

## 2012-01-05 NOTE — Telephone Encounter (Signed)
Patient was advised by his rheumatologist that he may have celiac causing some of his arthritis issues and wants him to have a bx.  I have scheduled an appt for 02/14/12.  His wife will contact his rheumatologist for records.

## 2012-02-14 ENCOUNTER — Encounter: Payer: Self-pay | Admitting: Internal Medicine

## 2012-02-14 ENCOUNTER — Ambulatory Visit (INDEPENDENT_AMBULATORY_CARE_PROVIDER_SITE_OTHER): Payer: BC Managed Care – PPO | Admitting: Internal Medicine

## 2012-02-14 VITALS — BP 120/86 | HR 76 | Ht 73.0 in | Wt 221.8 lb

## 2012-02-14 DIAGNOSIS — K3189 Other diseases of stomach and duodenum: Secondary | ICD-10-CM

## 2012-02-14 DIAGNOSIS — R894 Abnormal immunological findings in specimens from other organs, systems and tissues: Secondary | ICD-10-CM

## 2012-02-14 DIAGNOSIS — R1013 Epigastric pain: Secondary | ICD-10-CM

## 2012-02-14 DIAGNOSIS — Z860100 Personal history of colon polyps, unspecified: Secondary | ICD-10-CM

## 2012-02-14 NOTE — Patient Instructions (Addendum)
You have been scheduled for an endoscopy with propofol. Please follow written instructions given to you at your visit today. If you use inhalers (even only as needed) or a CPAP machine, please bring them with you on the day of your procedure.  Thank you for choosing me and [facility].  Carl E. Gessner, M.D., FACG  

## 2012-02-14 NOTE — Progress Notes (Addendum)
Subjective:    Patient ID: Aaron Stout, male    DOB: 10/20/1955, 56 y.o.   MRN: [id number] Referred by: Kari Baars, MD HPI The patient is a very nice middle-aged white man followed in this practice for many years and history of colon polyps and IBS. I took over his care from my retired partner a few years ago. More recently he has had problems with joint pains and dactylitis, and a trigger finger issue which have led to a rheumatology workup. He has hand pain and some swelling though no synovitis thickening and no signs of a systemic arthritis. As part of the workup he has positive celiac testing that includes a positive tissue transglutaminase antibody IgG. He has a gliadin antibody IgG positive as well. These values are 22.9 and 20. He IgA antibodies are 10.1 and 12.9, respectively and negative. Redness son was tried for his joint complaints but did not help.  He's had years of bloating and burping and flatulence. He tends to be constipated. There is profound fatigue. Feels like he has indigestion and dyspeptic symptoms as well. Probiotic did not help him. He has had negative random colon biopsies in the past and these have more diarrhea.  Allergies  Allergen Reactions  . Cymbalta (Duloxetine Hcl) Other (See Comments)    drowsiness  . Ibuprofen     REACTION: hives  . Penicillins     REACTION: unknown   Outpatient Prescriptions Prior to Visit  Medication Sig Dispense Refill  . acyclovir (ZOVIRAX) 400 MG tablet Take 400 mg by mouth 2 (two) times daily.        Marland Kitchen amphetamine-dextroamphetamine (ADDERALL XR) 30 MG 24 hr capsule Take 60 mg by mouth every morning.        Marland Kitchen aspirin 325 MG tablet Take 325 mg by mouth daily.       Marland Kitchen azelastine (ASTELIN) 137 MCG/SPRAY nasal spray 1 spray by Nasal route 2 (two) times daily. Use in each nostril as directed       . Calcium Carbonate (CALCIUM 600 PO) Take by mouth.       . Cholecalciferol (VITAMIN D PO) Take by mouth.        Tery Sanfilippo Calcium  (STOOL SOFTENER PO) Take by mouth.        . fexofenadine (ALLEGRA) 180 MG tablet Take 180 mg by mouth daily.        . fish oil-omega-3 fatty acids 1000 MG capsule Take 1,200 mg by mouth 2 (two) times daily. 2 BID       . fluticasone (FLONASE) 50 MCG/ACT nasal spray 2 sprays by Nasal route 2 (two) times daily.        . Folic Acid-Vit B6-Vit B12 (FOLBEE PO) Take by mouth.        . Glucosamine-Chondroit-Vit C-Mn (GLUCOSAMINE 1500 COMPLEX PO) Take by mouth.        . Multiple Vitamin (MULTIVITAMIN) tablet Take 1 tablet by mouth daily.        Marland Kitchen omeprazole-sodium bicarbonate (ZEGERID) 40-1100 MG per capsule Take 1 capsule by mouth 2 (two) times daily.        Marland Kitchen PERPHENAZINE PO Take 8 mg by mouth daily.       . Pitavastatin Calcium (LIVALO) 2 MG TABS Take 1 tablet (2 mg total) by mouth daily.  30 tablet  5  . QUEtiapine (SEROQUEL) 50 MG tablet Take 50 mg by mouth at bedtime.        . [DISCONTINUED] Alfuzosin HCl (UROXATRAL PO)  Take by mouth daily.       Last reviewed on 02/14/2012  9:25 AM by Iva Boop, MD Past Medical History  Diagnosis Date  . Syncope   . Coronary artery disease   . Bipolar 1 disorder   . Sleep apnea   . Hypercholesterolemia   . Depression   . Allergic rhinitis   . Chronic headaches   . Prostatitis   . Asthma   . IBS (irritable bowel syndrome)   . Internal and external hemorrhoids without complication   . Adenomatous colon polyp 2001  . GERD (gastroesophageal reflux disease)   . Radiculopathy of cervical spine   . Arthritis   . History of degenerative disc disease   . Chronic fatigue syndrome   . Diverticulosis   . Fibromyalgia    Past Surgical History  Procedure Date  . Cardiac catheterization 05/2008  . Uvulopalatopharyngoplasty 1990  . Nasal polyp excision   . Hand surgery 1971  . Back surgery 1975, 1985  . Colonoscopy w/ biopsies 1610,9604  . Esophagogastroduodenoscopy 09/1992   Medications, allergies, past medical history, past surgical history, family  history and social history are reviewed and updated in the EMR.    Review of Systems Positive for those things mentioned above, he has severe fatigue. He wears eyeglasses. All other review of systems appears negative at this time.    Objective:   Physical Exam General:  Well-developed, well-nourished and in no acute distress Eyes:  anicteric. Neck:   supple w/o thyromegaly or mass.  Lungs: Clear to auscultation bilaterally. Heart:  S1S2, no rubs, murmurs, gallops. Abdomen:  soft, non-tender, no hepatosplenomegaly, hernia, or mass and BS+.  Lymph:  no cervical or supraclavicular adenopathy. Extremities:   no edema - right trigger finger tendon thickening Skin   no rash. Neuro:  A&O x 3.  Psych:  appropriate mood and  Affect.   Data Reviewed:  Rheumatology notes, labs       Assessment & Plan:   1. Dyspepsia   2. Abnormal celiac antibody panel with mildly positive tissue transglutaminase IgG levels and antigliadin IgG levels    1. He could have celiac disease though and IgA tissue transglutaminase level is more specific. Note that his IgA level was normal. 2. When for an EGD with biopsy of the duodenum to see if he does have celiac disease 3. He certainly could have a gluten sensitivity and regardless of biopsy results I think a trial of free diet is reasonable. The risks and benefits as well as alternatives of endoscopic procedure(s) have been discussed and reviewed. All questions answered. The patient agrees to proceed.   I appreciate the opportunity to care for this patient.  CC: Kari Baars, MD, Kerin Perna, MD

## 2012-03-06 ENCOUNTER — Encounter: Payer: BC Managed Care – PPO | Admitting: Internal Medicine

## 2012-03-09 ENCOUNTER — Ambulatory Visit (AMBULATORY_SURGERY_CENTER): Payer: BC Managed Care – PPO | Admitting: Internal Medicine

## 2012-03-09 ENCOUNTER — Encounter: Payer: Self-pay | Admitting: Internal Medicine

## 2012-03-09 VITALS — BP 145/91 | HR 66 | Temp 97.6°F | Resp 15 | Ht 73.0 in | Wt 221.0 lb

## 2012-03-09 DIAGNOSIS — D1330 Benign neoplasm of unspecified part of small intestine: Secondary | ICD-10-CM

## 2012-03-09 DIAGNOSIS — D131 Benign neoplasm of stomach: Secondary | ICD-10-CM

## 2012-03-09 DIAGNOSIS — R1013 Epigastric pain: Secondary | ICD-10-CM

## 2012-03-09 DIAGNOSIS — K317 Polyp of stomach and duodenum: Secondary | ICD-10-CM

## 2012-03-09 DIAGNOSIS — R894 Abnormal immunological findings in specimens from other organs, systems and tissues: Secondary | ICD-10-CM

## 2012-03-09 DIAGNOSIS — K3189 Other diseases of stomach and duodenum: Secondary | ICD-10-CM

## 2012-03-09 MED ORDER — SODIUM CHLORIDE 0.9 % IV SOLN: 500.0000 mL | INTRAVENOUS | Status: DC

## 2012-03-09 NOTE — Progress Notes (Signed)
Patient did not experience any of the following events: a burn prior to discharge; a fall within the facility; wrong site/side/patient/procedure/implant event; or a hospital transfer or hospital admission upon discharge from the facility. (G8907) Patient did not have preoperative order for IV antibiotic SSI prophylaxis. (G8918)  

## 2012-03-09 NOTE — Op Note (Addendum)
[facility] [address] [place], [address]   ENDOSCOPY PROCEDURE REPORT  PATIENT: Aaron, Stout  MR#: [id number] BIRTHDATE: 1955/08/29 , 56  yrs. old GENDER: Male ENDOSCOPIST: Iva Boop, MD, [facility] PROCEDURE DATE:  03/09/2012 PROCEDURE:  EGD w/ biopsy ASA CLASS:     Class II INDICATIONS:  Dyspepsia.  Abnormal celiac testing. MEDICATIONS: propofol (Diprivan) 200mg  IV, MAC sedation, administered by CRNA, and These medications were titrated to patient response per physician's verbal order TOPICAL ANESTHETIC: Cetacaine Spray  DESCRIPTION OF PROCEDURE: After the risks benefits and alternatives of the procedure were thoroughly explained, informed consent was obtained.  The LB-GIF Q180 [id number] endoscope was introduced through the mouth and advanced to the second portion of the duodenum. Without limitations.  The instrument was slowly withdrawn as the mucosa was fully examined.        STOMACH: Multiple smooth sessile polyps measuring 3-10 mm in size were found in the gastric body.  Multiple biopsies was performed using cold forceps.  Sample sent for histology.   Abnormal mucosa was found in the gastric antrum.  The mucosa was erythematous. Multiple biopsies were performed using cold forceps.  Sample sent for histology.  DUODENUM: The duodenal mucosa showed no abnormalities in the bulb and second portion of the duodenum.  Cold forceps biopsies were taken in the bulb and second portion.  The remainder of the upper endoscopy exam was otherwise normal. Retroflexed views revealed polyps as above..     The scope was then withdrawn from the patient and the procedure completed.  COMPLICATIONS: There were no complications. ENDOSCOPIC IMPRESSION: 1.   Multiple sessile polyps measuring 3-10 mm in size were found in the gastric body -biopsied 2.   Abnormal mucosa was found in the gastric antrum; The mucosa was erythematous; multiple biopsies 3.   The  duodenal mucosa showed no abnormalities in the bulb and second portion of the duodenum -biopsied to look for celiac changes 4.   The remainder of the upper endoscopy exam was otherwise normal   RECOMMENDATIONS: Office will call with results He plans to try gluten-free diet regardless of biopsy results   eSigned:  Iva Boop, MD, [facility] 03/09/2012 4:17 PM Revised: 03/09/2012 4:17 PM  CC:W.  Buren Kos, MD, The Patient, and Pollyann Savoy, MD  PATIENT NAME:  Aaron, Stout MR#: [id number]

## 2012-03-09 NOTE — Patient Instructions (Addendum)
The endoscopy exam showed some gastric polyps. i took biopsies and expect them to be benign and not a problem. I also took stomach and duodenal biopsies to look for celiac disease and gastritis.  Try using some Gaviscon or other antacid to help your stomach symptoms while we wait for the biopsy results which should be available next week - we will call.  Thank you for choosing me and [facility].  Iva Boop, MD, FACG  YOU HAD AN ENDOSCOPIC PROCEDURE TODAY AT THE [facility]: Refer to the procedure report that was given to you for any specific questions about what was found during the examination.  If the procedure report does not answer your questions, please call your gastroenterologist to clarify.  If you requested that your care partner not be given the details of your procedure findings, then the procedure report has been included in a sealed envelope for you to review at your convenience later.  YOU SHOULD EXPECT: Some feelings of bloating in the abdomen. Passage of more gas than usual.  Walking can help get rid of the air that was put into your GI tract during the procedure and reduce the bloating. If you had a lower endoscopy (such as a colonoscopy or flexible sigmoidoscopy) you may notice spotting of blood in your stool or on the toilet paper. If you underwent a bowel prep for your procedure, then you may not have a normal bowel movement for a few days.  DIET: Your first meal following the procedure should be a light meal and then it is ok to progress to your normal diet.  A half-sandwich or bowl of soup is an example of a good first meal.  Heavy or fried foods are harder to digest and may make you feel nauseous or bloated.  Likewise meals heavy in dairy and vegetables can cause extra gas to form and this can also increase the bloating.  Drink plenty of fluids but you should avoid alcoholic beverages for 24 hours.  ACTIVITY: Your care partner should take you  home directly after the procedure.  You should plan to take it easy, moving slowly for the rest of the day.  You can resume normal activity the day after the procedure however you should NOT DRIVE or use heavy machinery for 24 hours (because of the sedation medicines used during the test).    SYMPTOMS TO REPORT IMMEDIATELY: A gastroenterologist can be reached at any hour.  During normal business hours, 8:30 AM to 5:00 PM Monday through Friday, call [phone].  After hours and on weekends, please call the GI answering service at [phone] who will take a message and have the physician on call contact you.    Following upper endoscopy (EGD)  Vomiting of blood or coffee ground material  New chest pain or pain under the shoulder blades  Painful or persistently difficult swallowing  New shortness of breath  Fever of 100F or higher  Black, tarry-looking stools  FOLLOW UP: If any biopsies were taken you will be contacted by phone or by letter within the next 1-3 weeks.  Call your gastroenterologist if you have not heard about the biopsies in 3 weeks.  Our staff will call the home number listed on your records the next business day following your procedure to check on you and address any questions or concerns that you may have at that time regarding the information given to you following your procedure. This is a courtesy call and so  if there is no answer at the home number and we have not heard from you through the emergency physician on call, we will assume that you have returned to your regular daily activities without incident.  SIGNATURES/CONFIDENTIALITY: You and/or your care partner have signed paperwork which will be entered into your electronic medical record.  These signatures attest to the fact that that the information above on your After Visit Summary has been reviewed and is understood.  Full responsibility of the confidentiality of this discharge information lies with you and/or  your care-partner.   Dr. Leone Payor will notify you of results of pathology of polyps, and biopsy results.

## 2012-03-12 ENCOUNTER — Telehealth: Payer: Self-pay | Admitting: *Deleted

## 2012-03-12 NOTE — Telephone Encounter (Signed)
  Follow up Call-  Call back number 03/09/2012  Post procedure Call Back phone  # [phone]  Permission to leave phone message Yes     Patient questions:  Do you have a fever, pain , or abdominal swelling? no Pain Score  0 *  Have you tolerated food without any problems? yes  Have you been able to return to your normal activities? yes  Do you have any questions about your discharge instructions: Diet   no Medications  no Follow up visit  no  Do you have questions or concerns about your Care? no  Actions: * If pain score is 4 or above: No action needed, pain <4.

## 2012-03-15 NOTE — Progress Notes (Signed)
Quick Note:  Office Let him know that biopsies are ok - polyps are benign and not a problem and biopsies are negative for celiac disease  If gluten free works he should continue (? Gluten sensitivity) See me prn  [facility]  No letter or recall  ______

## 2012-03-19 ENCOUNTER — Other Ambulatory Visit: Payer: Self-pay | Admitting: Urology

## 2012-04-03 ENCOUNTER — Encounter (HOSPITAL_BASED_OUTPATIENT_CLINIC_OR_DEPARTMENT_OTHER): Payer: Self-pay | Admitting: *Deleted

## 2012-04-03 NOTE — Progress Notes (Signed)
Pt instructd npo p mn 1/30 x acyclovir,zegerid w sip of water.  Ok to use nasal sprays am of surgery.  To [facility] 1/31 @ 0845.  Needs hgb on arrival.

## 2012-04-04 ENCOUNTER — Telehealth: Payer: Self-pay | Admitting: Physician Assistant

## 2012-04-04 NOTE — Telephone Encounter (Signed)
Patient called the answering service re: intermittent chest pain. He reports constant, dull left-sided chest pain described as pressure radiating to his left-arm acutely worsening at 20-minute intervals with associated shortness of breath, lightheadedness and palpitations. No association with exertion, meals, position changes, lying flat, coughing or palpation. May occur at rest. He had not experienced any chest pain prior to 3 days ago. He reports this has seemed to have worsened during this time. He has mild-moderate nonobstructive CAD by 2010 cath, cardiac RFs in HTN, HLD and 2 brothers with MI (3 years older) with CAD requiring stents. He has been advised to stop taking low-dose ASA for the past 4 days for a prostate procedure on 04/06/12. No NTG. His description is concerning for accelerating angina. Advised formal evaluation at an urgent care or ED tonight. He is admittedly hesitant with the weather outside. Outlined my recommendation once more. He elected to watch his symptoms for the next hour, and present for evaluation if they worsened. If they improve, he will call the office tomorrow for formal assessment.    Jacqulyn Bath, PA-C 04/04/2012 8:17 PM

## 2012-04-05 ENCOUNTER — Telehealth: Payer: Self-pay | Admitting: Cardiovascular Disease

## 2012-04-05 ENCOUNTER — Ambulatory Visit (INDEPENDENT_AMBULATORY_CARE_PROVIDER_SITE_OTHER): Payer: BC Managed Care – PPO | Admitting: Cardiovascular Disease

## 2012-04-05 ENCOUNTER — Encounter (HOSPITAL_BASED_OUTPATIENT_CLINIC_OR_DEPARTMENT_OTHER): Payer: Self-pay | Admitting: Anesthesiology

## 2012-04-05 ENCOUNTER — Encounter: Payer: Self-pay | Admitting: Cardiovascular Disease

## 2012-04-05 VITALS — BP 134/92 | HR 81 | Ht 73.5 in | Wt 223.0 lb

## 2012-04-05 DIAGNOSIS — R079 Chest pain, unspecified: Secondary | ICD-10-CM

## 2012-04-05 DIAGNOSIS — I2510 Atherosclerotic heart disease of native coronary artery without angina pectoris: Secondary | ICD-10-CM

## 2012-04-05 DIAGNOSIS — N411 Chronic prostatitis: Secondary | ICD-10-CM

## 2012-04-05 LAB — BASIC METABOLIC PANEL
CO2: 28 mEq/L (ref 19–32)
Calcium: 9.5 mg/dL (ref 8.4–10.5)
Creatinine, Ser: 1.1 mg/dL (ref 0.4–1.5)

## 2012-04-05 LAB — TSH: TSH: 0.63 u[IU]/mL (ref 0.35–5.50)

## 2012-04-05 NOTE — Telephone Encounter (Signed)
App made today

## 2012-04-05 NOTE — Telephone Encounter (Signed)
New problem:   Call the on -call service last night advise to call the office this am to be seen today    C/O chest pain & sob. Blood pressure machine given him an error reading

## 2012-04-05 NOTE — Patient Instructions (Addendum)
Your physician has requested that you have a lexiscan myoview.  Please follow instruction sheet, as given.   Your physician recommends that you return for lab work in: bmet tsh  Your physician recommends that you schedule a follow-up appointment in: 3 months    Your physician recommends that you continue on your current medications as directed. Please refer to the Current Medication list given to you today.

## 2012-04-05 NOTE — Progress Notes (Signed)
Consulted w Dr. Renold Don regarding pt's c/o and Dr. Harvie Bridge note.  Dr Renold Don requested clearance from Dr. Elease Hashimoto  To proceed in am, since a myoview  Is recommended.  Dr. Harvie Bridge progress and assessment notes faxed to Jolyn Lent @ [facility].  Pam called and informed of clearance needed for chart in am per Dr. Renold Don.

## 2012-04-05 NOTE — Telephone Encounter (Signed)
New Problem:    Called in wanting to know if the patient is cleared to have surgery.  Please call back.

## 2012-04-05 NOTE — Telephone Encounter (Signed)
Noted.  We should be able to work him in if he is still having chest pain.

## 2012-04-05 NOTE — Telephone Encounter (Signed)
Follow up      Wife calling back to checking on status of message that was sent this am .

## 2012-04-05 NOTE — Telephone Encounter (Signed)
App made for today ' 

## 2012-04-05 NOTE — Telephone Encounter (Signed)
Told them to postpone till lexiscan done.

## 2012-04-05 NOTE — Progress Notes (Addendum)
Received call from pt stating he visited his cardiologist today d/t c/o chest pain, pressure , indigestion like sensation and constant fullness, some dizziness. Ekg was wnl per pt . He wants to proceed. Dr. Renold Don paged via beeper.

## 2012-04-05 NOTE — Progress Notes (Signed)
Aaron Stout Date of Birth  1955-06-23       [facility]    [facility] [place] [address]  [address] [place], [place]  [address]   [place], [place]  [address] [phone]     [phone]   Fax  [phone]    Fax [phone]  Problem List: 1. Syncope 2. Mild to moderate coronary artery disease by heart catheterization ( 2010) 3. Bipolar disease 4. Sleep apnea 5. Hyperlipidemia- he tried  History of Present Illness:  Aaron Stout is doing well.  He denies any episodes of chest pain or shortness breath.  He's able to do his normal activities without any significant problems but he is not exercising on a regular basis.  He stop his atorvastatin several weeks ago because it seemed to exacerbate his trigger finger condition.  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  April 05, 2012: Aaron Stout presents today with some vague chest aches, fatigue, palpitations.   These episodes are not related to exertion,  Lasts for 2-3 seconds.  He has a hx of hiatal hernia.  He also has some palpitations  Current Outpatient Prescriptions on File Prior to Visit  Medication Sig Dispense Refill  . acyclovir (ZOVIRAX) 400 MG tablet Take 400 mg by mouth 2 (two) times daily.        Aaron Stout amphetamine-dextroamphetamine (ADDERALL XR) 30 MG 24 hr capsule Take 60 mg by mouth every morning.       Aaron Stout aspirin 325 MG tablet Take 325 mg by mouth daily.       Aaron Stout azelastine (ASTELIN) 137 MCG/SPRAY nasal spray 1 spray by Nasal route 2 (two) times daily. Use in each nostril as directed       . Calcium Carbonate (CALCIUM 600 PO) Take by mouth.       . Cholecalciferol (VITAMIN D PO) Take by mouth.        Aaron Stout Calcium (STOOL SOFTENER PO) Take by mouth.        . fexofenadine (ALLEGRA) 180 MG tablet Take 180 mg by mouth daily.        . fish oil-omega-3 fatty acids 1000 MG capsule Take 1,200 mg by mouth 2 (two) times daily. 2  BID       . fluticasone (FLONASE) 50 MCG/ACT nasal spray 2 sprays by Nasal route 2 (two) times daily.        . Glucosamine-Chondroit-Vit C-Mn (GLUCOSAMINE 1500 COMPLEX PO) Take by mouth.        . Multiple Vitamin (MULTIVITAMIN) tablet Take 1 tablet by mouth daily.        Aaron Stout omeprazole-sodium bicarbonate (ZEGERID) 40-1100 MG per capsule Take 1 capsule by mouth 2 (two) times daily.        Aaron Stout PERPHENAZINE PO Take 6 mg by mouth daily.       . Psyllium (FIBER) 0.52 G CAPS Take 1 capsule by mouth daily.      . QUEtiapine (SEROQUEL) 50 MG tablet Take 50 mg by mouth at bedtime.        . simvastatin (ZOCOR) 10 MG tablet Take 20 mg by mouth at bedtime.         Allergies  Allergen Reactions  . Cymbalta (Duloxetine Hcl) Other (See Comments)    drowsiness  . Ibuprofen     REACTION: hives  . Penicillins     REACTION: unknown    Past Medical History  Diagnosis Date  . Syncope     last episode 1 yr ago  . Coronary artery disease   . Bipolar 1 disorder   . Hypercholesterolemia   . Depression   . Allergic rhinitis   . Prostatitis   . Asthma   . IBS (irritable bowel syndrome)   . Internal and external hemorrhoids without complication   . Adenomatous colon polyp 2001  . GERD (gastroesophageal reflux disease)   . Radiculopathy of cervical spine   . Arthritis   . History of degenerative disc disease   . Chronic fatigue syndrome   . Diverticulosis   . Anxiety   . Sleep apnea     better s/p UPP  . Chronic headaches     d/t old neck injury  . Fibromyalgia   . Stiff neck   . Cancer     hx skin cancer  . HOH (hard of hearing)     slight    Past Surgical History  Procedure Date  . Cardiac catheterization 05/2008  . Uvulopalatopharyngoplasty 1990  . Nasal polyp excision   . Hand surgery 1971  . Back surgery 1975, 1985  . Colonoscopy w/ biopsies multiple  . Esophagogastroduodenoscopy multiple    History  Smoking status  . Former Smoker  . Quit date: 07/06/1980  Smokeless tobacco   . Never Used    History  Alcohol Use No    Family History  Problem Relation Age of Onset  . Breast cancer Mother 42  . Heart failure Father 52  . Heart disease      grandfather/uncle  . Alcoholism      greatgrandfather/uncle    . Colon polyps      uncle    Reviw of Systems:  Reviewed in the HPI.  All other systems are negative.  Physical Exam: Blood pressure 134/92, pulse 81, height 6' 1.5" (1.867 m), weight 223 lb (101.152 kg). General: Well developed, well nourished, in no acute distress.  Head: Normocephalic, atraumatic, sclera non-icteric, mucus membranes are moist,   Neck: Supple. Carotids are 2 + without bruits. No JVD  Lungs: Clear bilaterally to auscultation.  Heart: regular rate.  normal  S1 S2. No murmurs, gallops or rubs.  Abdomen: Soft, non-tender, non-distended with normal bowel sounds. No hepatomegaly. No rebound/guarding. No masses.  Msk:  Strength and tone are normal  Extremities: No clubbing or cyanosis. No edema.  Distal pedal pulses are 2+ and equal bilaterally.  Neuro: Alert and oriented X 3. Moves all extremities spontaneously.  Psych:  Responds to questions appropriately with a normal affect.  ECG: April 05, 2012: -normal sinus rhythm at 81 beats a minute. He has left axis deviation. There is a pulmonary disease pattern.  Assessment / Plan:

## 2012-04-05 NOTE — Assessment & Plan Note (Addendum)
Aaron Stout presents with some episodes of chest pain for the past 4 days. These episodes have occurred with rest and with exertion. It is described as chest pressure. He also has some indigestion-like sensation with these episodes.  He states that he has a constant fullness in his chest the They're associated with some palpitations and some transient lightheadedness.  He does not have any specific chest wall tenderness.  He has a history of mild coronary artery disease.  His EKG has mild ST/T-wave abnormalities.   I think it would be reasonable to do a [employer] study.    These pains may also be due to a hiatal hernia.  It sounds like he may be also having some premature ventricular contractions. Check some labs including a TSH and basic metabolic profile. I've advised him to eat more potassium containing foods.  It is my understanding that he was scheduled to have prostate surgery tomorrow. Although he seems to be doing fairly well, I can't be sure that is not having symptoms of unstable angina so I would not recommend general anesthesia at this point. I would like to wait and see what the Myoview study shows. If the Myoview study is normal then he may proceed with surgery and general anesthesia at a low risk.  I'll see him again in 3 months for followup office visit and EKG.  We'll call him sooner if he has any further problems.

## 2012-04-06 ENCOUNTER — Encounter (HOSPITAL_BASED_OUTPATIENT_CLINIC_OR_DEPARTMENT_OTHER): Admission: RE | Payer: Self-pay | Source: Ambulatory Visit

## 2012-04-06 ENCOUNTER — Ambulatory Visit (HOSPITAL_BASED_OUTPATIENT_CLINIC_OR_DEPARTMENT_OTHER): Admission: RE | Admit: 2012-04-06 | Payer: BC Managed Care – PPO | Source: Ambulatory Visit | Admitting: Urology

## 2012-04-06 HISTORY — DX: Unspecified hearing loss, unspecified ear: H91.90

## 2012-04-06 HISTORY — DX: Malignant (primary) neoplasm, unspecified: C80.1

## 2012-04-06 HISTORY — DX: Torticollis: M43.6

## 2012-04-06 SURGERY — GREEN LIGHT LASER TURP (TRANSURETHRAL RESECTION OF PROSTATE
Anesthesia: General

## 2012-04-07 HISTORY — PX: NM MYOVIEW LTD: HXRAD82

## 2012-04-11 ENCOUNTER — Ambulatory Visit (HOSPITAL_COMMUNITY): Payer: BC Managed Care – PPO | Attending: Cardiology | Admitting: Radiology

## 2012-04-11 VITALS — BP 129/83 | HR 75 | Ht 73.5 in | Wt 222.0 lb

## 2012-04-11 DIAGNOSIS — I2510 Atherosclerotic heart disease of native coronary artery without angina pectoris: Secondary | ICD-10-CM | POA: Insufficient documentation

## 2012-04-11 DIAGNOSIS — R9431 Abnormal electrocardiogram [ECG] [EKG]: Secondary | ICD-10-CM

## 2012-04-11 DIAGNOSIS — R0789 Other chest pain: Secondary | ICD-10-CM | POA: Insufficient documentation

## 2012-04-11 DIAGNOSIS — R0602 Shortness of breath: Secondary | ICD-10-CM | POA: Insufficient documentation

## 2012-04-11 DIAGNOSIS — R002 Palpitations: Secondary | ICD-10-CM | POA: Insufficient documentation

## 2012-04-11 DIAGNOSIS — R42 Dizziness and giddiness: Secondary | ICD-10-CM | POA: Insufficient documentation

## 2012-04-11 DIAGNOSIS — R079 Chest pain, unspecified: Secondary | ICD-10-CM

## 2012-04-11 MED ORDER — TECHNETIUM TC 99M SESTAMIBI GENERIC - CARDIOLITE
11.0000 | Freq: Once | INTRAVENOUS | Status: AC | PRN
  Administered 2012-04-11: 11 via INTRAVENOUS

## 2012-04-11 MED ORDER — REGADENOSON 0.4 MG/5ML IV SOLN
0.4000 mg | Freq: Once | INTRAVENOUS | Status: AC
  Administered 2012-04-11: 0.4 mg via INTRAVENOUS

## 2012-04-11 MED ORDER — TECHNETIUM TC 99M SESTAMIBI GENERIC - CARDIOLITE
33.0000 | Freq: Once | INTRAVENOUS | Status: AC | PRN
  Administered 2012-04-11: 33 via INTRAVENOUS

## 2012-04-11 NOTE — Progress Notes (Signed)
[facility] SITE 3 NUCLEAR MED [address] [place], [place] [address] [phone]    Cardiology Nuclear Med Study  Aaron Stout is a 57 y.o. male     MRN : [id number]     DOB: 09-15-1955  Procedure Date: 04/11/2012  Nuclear Med Background Indication for Stress Test:  Evaluation for Ischemia, Abnormal EKG and Pending Clearance for Prostate Surgery by Dr. [name] History:  '10 Stress Echo:abnormal distal septal segment>Cath:mild CAD Cardiac Risk Factors: Family History - CAD, History of Smoking, Lipids and RBBB  Symptoms:  Chest Pain/Pressure with and without Exertion (last episode of chest discomfort is now, 2-3/10, it's "constant"), Fatigue, Light-Headedness/SOB with Palpitations    Nuclear Pre-Procedure Caffeine/Decaff Intake:  None NPO After: 8:00am   Lungs:  Clear. O2 Sat: 99% on room air. IV 0.9% NS with Angio Cath:  22g  IV Site: R Antecubital  IV Started by:  Bonnita Levan, RN  Chest Size (in):  46 Cup Size: n/a  Height: 6' 1.5" (1.867 m)  Weight:  222 lb (100.699 kg)  BMI:  Body mass index is 28.89 kg/(m^2). Tech Comments:  N/A    Nuclear Med Study 1 or 2 day study: 1 day  Stress Test Type:  Lexiscan  Reading MD: Olga Millers, MD  Order Authorizing Provider:  Kristeen Miss, MD  Resting Radionuclide: Technetium 76m Sestamibi  Resting Radionuclide Dose: 11.0 mCi   Stress Radionuclide:  Technetium 56m Sestamibi  Stress Radionuclide Dose: 33.0 mCi           Stress Protocol Rest HR: 129/83 Stress HR: 115  Rest BP: 129/83 Stress BP: 139/79  Exercise Time (min): 2:00 METS: n/a   Predicted Max HR: 164 bpm % Max HR: 70.12 bpm Rate Pressure Product: 21308    Dose of Adenosine (mg):  n/a Dose of Lexiscan: 0.4 mg  Dose of Atropine (mg): n/a Dose of Dobutamine: n/a mcg/kg/min (at max HR)  Stress Test Technologist: Smiley Houseman, CMA-N  Nuclear Technologist:  Domenic Polite, CNMT     Rest Procedure:  Myocardial perfusion imaging was performed  at rest 45 minutes following the intravenous administration of Technetium 36m Sestamibi.  Rest ECG: NSR, LAD  Stress Procedure:  The patient received IV Lexiscan 0.4 mg over 15-seconds with concurrent low level exercise and then Technetium 39m Sestamibi was injected at 30-seconds while the patient continued walking one more minute.  Quantitative spect images were obtained after a 45-minute delay.  Stress ECG: No significant ST segment change suggestive of ischemia.  QPS Raw Data Images:  Acquisition technically good; LVE. Stress Images:  Normal homogeneous uptake in all areas of the myocardium. Rest Images:  Normal homogeneous uptake in all areas of the myocardium. Subtraction (SDS):  No evidence of ischemia. Transient Ischemic Dilatation (Normal <1.22):  1.06 Lung/Heart Ratio (Normal <0.45):  0.33  Quantitative Gated Spect Images QGS EDV:  127 ml QGS ESV:  68 ml  Impression Exercise Capacity:  Lexiscan with low level exercise. BP Response:  Normal blood pressure response. Clinical Symptoms:  There is dyspnea. ECG Impression:  No significant ST segment change suggestive of ischemia. Comparison with Prior Nuclear Study: No images to compare  Overall Impression:  Intermediate stress nuclear study due to reduced LV function; no ischemia or infarction; suggest echocardiogram to better assess LV function.  LV Ejection Fraction: 46%.  LV Wall Motion:  Global hypokinesis.  Olga Millers

## 2012-04-22 ENCOUNTER — Other Ambulatory Visit: Payer: Self-pay

## 2012-05-01 ENCOUNTER — Encounter: Payer: Self-pay | Admitting: Cardiovascular Disease

## 2012-05-01 NOTE — Telephone Encounter (Signed)
error 

## 2012-05-02 NOTE — Telephone Encounter (Signed)
Follow Up      Following up on clearance for surgery (not scheduled yet).

## 2012-05-03 ENCOUNTER — Encounter: Payer: Self-pay | Admitting: *Deleted

## 2012-05-03 NOTE — Telephone Encounter (Signed)
Jodette, will you pull his paper chart please

## 2012-05-03 NOTE — Telephone Encounter (Signed)
New Prob     Pt wants to know if his 46% EF can be causing him fatigue. Would like to speak to nurse.

## 2012-05-03 NOTE — Telephone Encounter (Signed)
MR called to request chart.

## 2012-05-03 NOTE — Telephone Encounter (Signed)
Per Dr Elease Hashimoto he is cleared for surgery with low risk after review of paper chart. Pt was called and msg left, letter forwarded to Dr Patsi Sears.

## 2012-05-08 ENCOUNTER — Encounter: Payer: Self-pay | Admitting: Cardiology

## 2012-05-08 ENCOUNTER — Encounter: Payer: Self-pay | Admitting: Cardiovascular Disease

## 2012-05-09 ENCOUNTER — Other Ambulatory Visit: Payer: Self-pay | Admitting: Urology

## 2012-05-22 ENCOUNTER — Encounter (HOSPITAL_COMMUNITY): Payer: Self-pay | Admitting: Pharmacy Technician

## 2012-05-23 ENCOUNTER — Encounter (HOSPITAL_COMMUNITY)
Admission: RE | Admit: 2012-05-23 | Discharge: 2012-05-23 | Disposition: A | Discharge status: Home / Self Care | Payer: BC Managed Care – PPO | Source: Ambulatory Visit | Attending: Urology | Admitting: Urology

## 2012-05-23 ENCOUNTER — Encounter (HOSPITAL_COMMUNITY): Payer: Self-pay

## 2012-05-23 HISTORY — DX: Other complications of anesthesia, initial encounter: T88.59XA

## 2012-05-23 HISTORY — DX: Cardiac arrhythmia, unspecified: I49.9

## 2012-05-23 HISTORY — DX: Adverse effect of unspecified anesthetic, initial encounter: T41.45XA

## 2012-05-23 LAB — BASIC METABOLIC PANEL
BUN: 16 mg/dL (ref 6–23)
GFR calc Af Amer: >90 mL/min (ref >90)
GFR calc non Af Amer: 90 mL/min — ABNORMAL LOW (ref >90)
Potassium: 4 mEq/L (ref 3.5–5.1)

## 2012-05-23 LAB — CBC
Hemoglobin: 15 g/dL (ref 13.0–17.0)
MCHC: 33.6 g/dL (ref 30.0–36.0)
RDW: 12.8 % (ref 11.5–15.5)

## 2012-05-23 LAB — SURGICAL PCR SCREEN
MRSA, PCR: NEGATIVE
Staphylococcus aureus: NEGATIVE

## 2012-05-23 NOTE — Patient Instructions (Addendum)
20 KARINA LENDERMAN  05/23/2012   Your procedure is scheduled on:  3-28 -2014  Report to [facility] at      0630  AM .  Call this number if you have problems the morning of surgery: [phone]  Or Presurgical Testing [phone](Beonca Gibb)   Remember: Follow any bowel prep instructions per MD office.   Do not eat food:After Midnight.    Take these medicines the morning of surgery with A SIP OF WATER: Acyclovir. Astelin and Flonase(if needed-unless using Mupirocin Oint.). Allegra. Zegerid. Perphenazine.   Do not wear jewelry, make-up or nail polish.  Do not wear lotions, powders, or perfumes. You may wear deodorant.  Do not shave 12 hours prior to first CHG shower(legs and under arms).(face and neck okay.)  Do not bring valuables to the hospital.  Contacts, dentures or bridgework,body piercing,  may not be worn into surgery.  Leave suitcase in the car. After surgery it may be brought to your room.  For patients admitted to the hospital, checkout time is 11:00 AM the day of discharge.   Patients discharged the day of surgery will not be allowed to drive home. Must have responsible person with you x 24 hours once discharged.  Name and phone number of your driver: Jazmine Longshore. -spouse [phone] cell  Special Instructions: CHG(Chlorhedine 4%-"Hibiclens","Betasept","Aplicare") Shower Use Special Wash: see special instructions.(avoid face and genitals)   Please read over the following fact sheets that you were given: MRSA Information.    Failure to follow these instructions may result in Cancellation of your surgery.   Patient signature_______________________________________________________

## 2012-05-23 NOTE — Pre-Procedure Instructions (Signed)
05-23-12 Cardiac clearance note(05-03-12-Dr. Nahser) -with chart. Ekg, LOV notes 1'14-Epic. Stress test- 2'14-Epic.  Echo-2011-Epic.

## 2012-06-01 ENCOUNTER — Ambulatory Visit (HOSPITAL_COMMUNITY)
Admission: RE | Admit: 2012-06-01 | Discharge: 2012-06-01 | Disposition: A | Discharge status: Home / Self Care | Payer: BC Managed Care – PPO | Source: Ambulatory Visit | Attending: Urology | Admitting: Urology

## 2012-06-01 ENCOUNTER — Encounter (HOSPITAL_COMMUNITY): Payer: Self-pay | Admitting: *Deleted

## 2012-06-01 ENCOUNTER — Encounter (HOSPITAL_COMMUNITY)
Admission: RE | Disposition: A | Discharge status: Home / Self Care | Payer: Self-pay | Source: Ambulatory Visit | Attending: Urology

## 2012-06-01 ENCOUNTER — Ambulatory Visit (HOSPITAL_COMMUNITY): Payer: BC Managed Care – PPO | Admitting: Certified Registered Nurse Anesthetist

## 2012-06-01 ENCOUNTER — Encounter (HOSPITAL_COMMUNITY): Payer: Self-pay | Admitting: Certified Registered Nurse Anesthetist

## 2012-06-01 DIAGNOSIS — Z880 Allergy status to penicillin: Secondary | ICD-10-CM | POA: Insufficient documentation

## 2012-06-01 DIAGNOSIS — F411 Generalized anxiety disorder: Secondary | ICD-10-CM | POA: Insufficient documentation

## 2012-06-01 DIAGNOSIS — F329 Major depressive disorder, single episode, unspecified: Secondary | ICD-10-CM | POA: Insufficient documentation

## 2012-06-01 DIAGNOSIS — R339 Retention of urine, unspecified: Secondary | ICD-10-CM | POA: Insufficient documentation

## 2012-06-01 DIAGNOSIS — R3915 Urgency of urination: Secondary | ICD-10-CM | POA: Insufficient documentation

## 2012-06-01 DIAGNOSIS — Z886 Allergy status to analgesic agent status: Secondary | ICD-10-CM | POA: Insufficient documentation

## 2012-06-01 DIAGNOSIS — N400 Enlarged prostate without lower urinary tract symptoms: Secondary | ICD-10-CM

## 2012-06-01 DIAGNOSIS — N401 Enlarged prostate with lower urinary tract symptoms: Secondary | ICD-10-CM | POA: Insufficient documentation

## 2012-06-01 DIAGNOSIS — R39198 Other difficulties with micturition: Secondary | ICD-10-CM | POA: Insufficient documentation

## 2012-06-01 DIAGNOSIS — Z888 Allergy status to other drugs, medicaments and biological substances status: Secondary | ICD-10-CM | POA: Insufficient documentation

## 2012-06-01 DIAGNOSIS — R350 Frequency of micturition: Secondary | ICD-10-CM | POA: Insufficient documentation

## 2012-06-01 DIAGNOSIS — G4730 Sleep apnea, unspecified: Secondary | ICD-10-CM | POA: Insufficient documentation

## 2012-06-01 DIAGNOSIS — J45909 Unspecified asthma, uncomplicated: Secondary | ICD-10-CM | POA: Insufficient documentation

## 2012-06-01 DIAGNOSIS — Z87891 Personal history of nicotine dependence: Secondary | ICD-10-CM | POA: Insufficient documentation

## 2012-06-01 DIAGNOSIS — Z7982 Long term (current) use of aspirin: Secondary | ICD-10-CM | POA: Insufficient documentation

## 2012-06-01 DIAGNOSIS — F3289 Other specified depressive episodes: Secondary | ICD-10-CM | POA: Insufficient documentation

## 2012-06-01 DIAGNOSIS — R351 Nocturia: Secondary | ICD-10-CM | POA: Insufficient documentation

## 2012-06-01 DIAGNOSIS — Z79899 Other long term (current) drug therapy: Secondary | ICD-10-CM | POA: Insufficient documentation

## 2012-06-01 DIAGNOSIS — N411 Chronic prostatitis: Secondary | ICD-10-CM | POA: Insufficient documentation

## 2012-06-01 DIAGNOSIS — N529 Male erectile dysfunction, unspecified: Secondary | ICD-10-CM | POA: Insufficient documentation

## 2012-06-01 DIAGNOSIS — F909 Attention-deficit hyperactivity disorder, unspecified type: Secondary | ICD-10-CM | POA: Insufficient documentation

## 2012-06-01 DIAGNOSIS — K219 Gastro-esophageal reflux disease without esophagitis: Secondary | ICD-10-CM | POA: Insufficient documentation

## 2012-06-01 HISTORY — PX: GREEN LIGHT LASER TURP (TRANSURETHRAL RESECTION OF PROSTATE: SHX6260

## 2012-06-01 SURGERY — GREEN LIGHT LASER TURP (TRANSURETHRAL RESECTION OF PROSTATE
Anesthesia: General | Wound class: Clean Contaminated

## 2012-06-01 MED ORDER — CIPROFLOXACIN HCL 500 MG PO TABS: 500.0000 mg | ORAL_TABLET | Freq: Two times a day (BID) | ORAL | Status: DC

## 2012-06-01 MED ORDER — MEPERIDINE HCL 50 MG/ML IJ SOLN: 6.2500 mg | INTRAMUSCULAR | Status: DC | PRN

## 2012-06-01 MED ORDER — MIDAZOLAM HCL 5 MG/5ML IJ SOLN
INTRAMUSCULAR | Status: DC | PRN
  Administered 2012-06-01: 2 mg via INTRAVENOUS

## 2012-06-01 MED ORDER — ALBUTEROL SULFATE (5 MG/ML) 0.5% IN NEBU
INHALATION_SOLUTION | RESPIRATORY_TRACT | Status: AC
  Administered 2012-06-01: 2.5 mg
  Filled 2012-06-01: qty 0.5

## 2012-06-01 MED ORDER — LACTATED RINGERS IV SOLN
INTRAVENOUS | Status: DC
  Administered 2012-06-01: 1000 mL via INTRAVENOUS

## 2012-06-01 MED ORDER — CIPROFLOXACIN IN D5W 400 MG/200ML IV SOLN
400.0000 mg | INTRAVENOUS | Status: AC
  Administered 2012-06-01: 400 mg via INTRAVENOUS

## 2012-06-01 MED ORDER — GLYCOPYRROLATE 0.2 MG/ML IJ SOLN
INTRAMUSCULAR | Status: DC | PRN
  Administered 2012-06-01: 0.2 mg via INTRAVENOUS

## 2012-06-01 MED ORDER — ALBUTEROL SULFATE (5 MG/ML) 0.5% IN NEBU
INHALATION_SOLUTION | RESPIRATORY_TRACT | Status: AC
  Filled 2012-06-01: qty 0.5

## 2012-06-01 MED ORDER — ONDANSETRON HCL 4 MG/2ML IJ SOLN
INTRAMUSCULAR | Status: DC | PRN
  Administered 2012-06-01: 4 mg via INTRAVENOUS

## 2012-06-01 MED ORDER — DEXAMETHASONE SODIUM PHOSPHATE 10 MG/ML IJ SOLN
INTRAMUSCULAR | Status: DC | PRN
  Administered 2012-06-01: 10 mg via INTRAVENOUS

## 2012-06-01 MED ORDER — FENTANYL CITRATE 0.05 MG/ML IJ SOLN
25.0000 ug | INTRAMUSCULAR | Status: DC | PRN
  Administered 2012-06-01 (×2): 25 ug via INTRAVENOUS

## 2012-06-01 MED ORDER — PHENAZOPYRIDINE HCL 200 MG PO TABS: 200.0000 mg | ORAL_TABLET | Freq: Once | ORAL | Status: DC

## 2012-06-01 MED ORDER — OXYCODONE-ACETAMINOPHEN 5-325 MG PO TABS: 1.0000 | ORAL_TABLET | Freq: Four times a day (QID) | ORAL | Status: DC | PRN

## 2012-06-01 MED ORDER — OXYCODONE-ACETAMINOPHEN 5-325 MG PO TABS
1.0000 | ORAL_TABLET | Freq: Four times a day (QID) | ORAL | Status: DC | PRN
  Administered 2012-06-01: 1 via ORAL
  Filled 2012-06-01: qty 1

## 2012-06-01 MED ORDER — CIPROFLOXACIN IN D5W 400 MG/200ML IV SOLN
INTRAVENOUS | Status: AC
  Filled 2012-06-01: qty 200

## 2012-06-01 MED ORDER — LACTATED RINGERS IV SOLN: INTRAVENOUS | Status: DC

## 2012-06-01 MED ORDER — PROPOFOL 10 MG/ML IV EMUL
INTRAVENOUS | Status: DC | PRN
  Administered 2012-06-01: 200 mg via INTRAVENOUS

## 2012-06-01 MED ORDER — ACETAMINOPHEN 10 MG/ML IV SOLN
1000.0000 mg | Freq: Four times a day (QID) | INTRAVENOUS | Status: DC
  Administered 2012-06-01: 1000 mg via INTRAVENOUS

## 2012-06-01 MED ORDER — BELLADONNA ALKALOIDS-OPIUM 16.2-60 MG RE SUPP
RECTAL | Status: AC
  Filled 2012-06-01: qty 1

## 2012-06-01 MED ORDER — LIDOCAINE HCL (CARDIAC) 20 MG/ML IV SOLN
INTRAVENOUS | Status: DC | PRN
  Administered 2012-06-01: 100 mg via INTRAVENOUS

## 2012-06-01 MED ORDER — PROMETHAZINE HCL 25 MG/ML IJ SOLN: 6.2500 mg | INTRAMUSCULAR | Status: DC | PRN

## 2012-06-01 MED ORDER — PHENAZOPYRIDINE HCL 200 MG PO TABS: 200.0000 mg | ORAL_TABLET | Freq: Three times a day (TID) | ORAL | Status: DC | PRN

## 2012-06-01 MED ORDER — FENTANYL CITRATE 0.05 MG/ML IJ SOLN
INTRAMUSCULAR | Status: AC
  Filled 2012-06-01: qty 2

## 2012-06-01 MED ORDER — FENTANYL CITRATE 0.05 MG/ML IJ SOLN
INTRAMUSCULAR | Status: DC | PRN
  Administered 2012-06-01 (×4): 50 ug via INTRAVENOUS

## 2012-06-01 MED ORDER — ALBUTEROL SULFATE (5 MG/ML) 0.5% IN NEBU: 2.5000 mg | INHALATION_SOLUTION | Freq: Once | RESPIRATORY_TRACT | Status: DC

## 2012-06-01 MED ORDER — SODIUM CHLORIDE 0.9 % IR SOLN
Status: DC | PRN
  Administered 2012-06-01: 3000 mL via INTRAVESICAL

## 2012-06-01 MED ORDER — BELLADONNA ALKALOIDS-OPIUM 16.2-60 MG RE SUPP
RECTAL | Status: DC | PRN
  Administered 2012-06-01: 1 via RECTAL

## 2012-06-01 MED ORDER — ACETAMINOPHEN 10 MG/ML IV SOLN
INTRAVENOUS | Status: AC
  Filled 2012-06-01: qty 100

## 2012-06-01 SURGICAL SUPPLY — 23 items
BAG URINE DRAINAGE (UROLOGICAL SUPPLIES) ×2 IMPLANT
BAG URO CATCHER STRL LF (DRAPE) ×2 IMPLANT
CATH FOLEY 2WAY SLVR 30CC 22FR (CATHETERS) IMPLANT
CATH HEMA 3WAY 30CC 22FR COUDE (CATHETERS) ×3 IMPLANT
CLOTH BEACON ORANGE TIMEOUT ST (SAFETY) ×2 IMPLANT
DRAPE CAMERA CLOSED 9X96 (DRAPES) ×2 IMPLANT
ELECT BUTTON HF 24-28F 2 30DE (ELECTRODE) IMPLANT
ELECT LOOP MED HF 24F 12D (CUTTING LOOP) IMPLANT
ELECT LOOP MED HF 24F 12D CBL (CLIP) IMPLANT
ELECT RESECT VAPORIZE 12D CBL (ELECTRODE) IMPLANT
FEE RENTAL LASER GREENLIGHT (Laser) ×1 IMPLANT
GLOVE BIOGEL M STRL SZ7.5 (GLOVE) ×2 IMPLANT
GOWN STRL REIN XL XLG (GOWN DISPOSABLE) ×2 IMPLANT
HOLDER FOLEY CATH W/STRAP (MISCELLANEOUS) ×1 IMPLANT
KIT ASPIRATION TUBING (SET/KITS/TRAYS/PACK) ×2 IMPLANT
LASER FIBER /GREENLIGHT LASER (Laser) ×2 IMPLANT
LASER GREENLIGHT RENTAL P/PROC (Laser) ×2 IMPLANT
MANIFOLD NEPTUNE II (INSTRUMENTS) ×2 IMPLANT
PACK CYSTO (CUSTOM PROCEDURE TRAY) ×2 IMPLANT
PLUG CATH AND CAP STER (CATHETERS) ×2 IMPLANT
SYR 30ML LL (SYRINGE) ×1 IMPLANT
SYRINGE IRR TOOMEY STRL 70CC (SYRINGE) ×1 IMPLANT
TUBING CONNECTING 10 (TUBING) ×2 IMPLANT

## 2012-06-01 NOTE — Op Note (Signed)
Pre-operative diagnosis :   BPH  Postoperative diagnosis:   Same  Operation:   Cystourethroscopy, greenlight laser vaporization of prostate  Surgeon:  S. Patsi Sears, MD  First assistant:   None  Anesthesia:  general  Preparation:  After appropriate preanesthesia, the patient was brought to the operating room, placed on the operating table in the dorsal supine position where general LMA anesthesia was introduced. He was then replaced in the dorsal lithotomy position where the pubis was prepped with Betadine solution and draped in usual fashion. The arm band was double checked.  Review history:  Benign Prostatic Hyperplasia Localized With Urinary Obstruction With Other Lower Urinary Tract  Symptoms 600.21  History of Present Illness  57 yo yo male returns today for a cystoscopy, flowrate, PUS & PVR for possible candidate for CTT vs TUNA vs Green Light procedure. Hx of BPH (taking Avodart) and ED. He has failed Rapaflo (not effective & also caused retrograde ejaculation) and had a fainting episode with Uroxatral. He has previously taken Avodart in the past, shrinking prostate from 71cc to 41cc in 2009. IPSS = 15 while taking Rapaflo. Pt has ED-had flushed reaction with Viagra, and partial reaction with 10mg  Levitra. Currently taking Levitra 20mg . IPSS=23. with incomplete emptying, frequency, intermittancy, urgency, weak steam, and nocturia.  06/19/09 PSA - 1.00    Statement of  Likelihood of Success: Excellent. TIME-OUT observed.:  Procedure:   Cystourethroscopy was accomplished, and bilobar BPH was identified. Using the greenlight laser, a trough was developed at the 7:00 and the 5:00 position, from the bladder neck to the vera room. Following this, lateral lobe vaporization was accomplished using 80 W power at the bladder neck, and 121 hour the lateral lobes. Minimal bleeding was noted, and this was coagulated using the laser as well. Following laser vaporization of lateral lobes, the bladder  was irrigated, and a size 22 three-way Foley cath was placed in an in the bladder. No continuous irrigation was needed. No traction was necessary, but the traction device was left in place. The patient was awakened, taken to recovery room in good condition. 30 cc was placed in the balloon. He received IV Tylenol. Because of his allergy to anti-inflammatories, he did not receive any IV Toradol. He did have a B. and O. suppository.

## 2012-06-01 NOTE — Anesthesia Preprocedure Evaluation (Addendum)
Anesthesia Evaluation  Patient identified by MRN, date of birth, ID band Patient awake    Reviewed: Allergy & Precautions, H&P , NPO status , Patient's Chart, lab work & pertinent test results  History of Anesthesia Complications (+) PONV  Airway Mallampati: II TM Distance: >3 FB Neck ROM: Full    Dental no notable dental hx.    Pulmonary asthma , neg sleep apnea,  breath sounds clear to auscultation  Pulmonary exam normal       Cardiovascular - CAD - dysrhythmias Rhythm:Regular Rate:Normal     Neuro/Psych negative neurological ROS  negative psych ROS   GI/Hepatic Neg liver ROS, GERD-  Medicated and Controlled,  Endo/Other  negative endocrine ROS  Renal/GU negative Renal ROS  negative genitourinary   Musculoskeletal negative musculoskeletal ROS (+)   Abdominal   Peds negative pediatric ROS (+)  Hematology negative hematology ROS (+)   Anesthesia Other Findings Lower front chipped tooth  Reproductive/Obstetrics negative OB ROS                          Anesthesia Physical Anesthesia Plan  ASA: II  Anesthesia Plan: General   Post-op Pain Management:    Induction: Intravenous  Airway Management Planned: LMA  Additional Equipment:   Intra-op Plan:   Post-operative Plan: Extubation in OR  Informed Consent: I have reviewed the patients History and Physical, chart, labs and discussed the procedure including the risks, benefits and alternatives for the proposed anesthesia with the patient or authorized representative who has indicated his/her understanding and acceptance.   Dental advisory given  Plan Discussed with: CRNA  Anesthesia Plan Comments:         Anesthesia Quick Evaluation

## 2012-06-01 NOTE — Interval H&P Note (Signed)
History and Physical Interval Note:  06/01/2012 9:03 AM  Aaron Stout  has presented today for surgery, with the diagnosis of BPH   The various methods of treatment have been discussed with the patient and family. After consideration of risks, benefits and other options for treatment, the patient has consented to  Procedure(s): GREEN LIGHT LASER OF PROSTATE  (N/A) as a surgical intervention .  The patient's history has been reviewed, patient examined, no change in status, stable for surgery.  I have reviewed the patient's chart and labs.  Questions were answered to the patient's satisfaction.     [name] I

## 2012-06-01 NOTE — Preoperative (Signed)
Beta Blockers   Reason not to administer Beta Blockers:Not Applicable 

## 2012-06-01 NOTE — Anesthesia Postprocedure Evaluation (Signed)
  Anesthesia Post-op Note  Patient: Aaron Stout  Procedure(s) Performed: Procedure(s) (LRB): GREEN LIGHT LASER OF PROSTATE  (N/A)  Patient Location: PACU  Anesthesia Type: General  Level of Consciousness: awake and alert   Airway and Oxygen Therapy: Patient Spontanous Breathing  Post-op Pain: mild  Post-op Assessment: Post-op Vital signs reviewed, Patient's Cardiovascular Status Stable, Respiratory Function Stable, Patent Airway and No signs of Nausea or vomiting  Last Vitals:  Filed Vitals:   06/01/12 1030  BP: 139/81  Pulse: 82  Temp:   Resp: 16    Post-op Vital Signs: stable   Complications: No apparent anesthesia complications

## 2012-06-01 NOTE — Transfer of Care (Signed)
Immediate Anesthesia Transfer of Care Note  Patient: Aaron Stout  Procedure(s) Performed: Procedure(s) (LRB): GREEN LIGHT LASER OF PROSTATE  (N/A)  Patient Location: PACU  Anesthesia Type: General  Level of Consciousness: sedated, patient cooperative and responds to stimulaton  Airway & Oxygen Therapy: Patient Spontanous Breathing and Patient connected to face mask oxgen  Post-op Assessment: Report given to PACU RN and Post -op Vital signs reviewed and stable  Post vital signs: Reviewed and stable  Complications: No apparent anesthesia complications

## 2012-06-01 NOTE — Anesthesia Postprocedure Evaluation (Signed)
  Anesthesia Post-op Note  Patient: Aaron Stout  Procedure(s) Performed: Procedure(s) (LRB): GREEN LIGHT LASER OF PROSTATE  (N/A)  Patient Location: PACU  Anesthesia Type: General  Level of Consciousness: awake and alert   Airway and Oxygen Therapy: Patient Spontanous Breathing  Post-op Pain: mild  Post-op Assessment: Post-op Vital signs reviewed, Patient's Cardiovascular Status Stable, Respiratory Function Stable, Patent Airway and No signs of Nausea or vomiting  Last Vitals:  Filed Vitals:   06/01/12 1402  BP: 130/82  Pulse: 98  Temp: 36.5 C  Resp: 16    Post-op Vital Signs: stable   Complications: No apparent anesthesia complications

## 2012-06-01 NOTE — H&P (Signed)
eason For Visit  Cystoscopy, flowrate, PUS, and PVR   Active Problems Problems  1. Benign Prostatic Hyperplasia Localized With Urinary Obstruction With Other Lower Urinary Tract  Symptoms 600.21  History of Present Illness        57 yo yo male returns today for a cystoscopy, flowrate, PUS & PVR for possible candidate for CTT vs TUNA vs Green Light procedure.  Hx of BPH (taking Avodart) and ED.  He has failed Rapaflo (not effective & also caused retrograde ejaculation) and had a fainting episode with Uroxatral.  He has previously taken Avodart in the past, shrinking prostate from 71cc to 41cc in 2009.  IPSS = 15 while taking Rapaflo. Pt has ED-had flushed reaction with Viagra, and partial reaction with 10mg  Levitra.  Currently taking Levitra 20mg .  IPSS=23. with incomplete emptying, frequency, intermittancy, urgency, weak steam, and nocturia.     06/19/09  PSA - 1.00   Past Medical History Problems  1. 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 History of  Adult Sleep Apnea 780.57 3. History of  Anxiety (Symptom) 799.2 4. History of  Anxiety (Symptom) 300.00 5. History of  Asthma 493.90 6. History of  Depression 311 7. History of  Esophageal Reflux 530.81 8. History of  Peptic Ulcer V12.71  Surgical History Problems  1. History of  Back Surgery 2. History of  Nose Surgery  Current Meds 1. Acyclovir 400 MG Oral Tablet; Therapy: (Recorded:29Jan2008) to 2. Amphetamine-Dextroamphet ER 30 MG Oral Capsule Extended Release 24 Hour; Therapy:  30Jul2012 to 3. Aspirin 325 MG Oral Tablet; Therapy: (Recorded:29Nov2011) to 4. Astelin SOLN; Therapy: (Recorded:29Jan2008) to 5. Atorvastatin Calcium 20 MG Oral Tablet; Therapy: 16Jan2012 to 6. Avodart 0.5 MG Oral Capsule; TAKE 1 CAPSULE Daily; Therapy: 31Jul2013 to  (Evaluate:04Jan2014)  Requested for: 05Nov2013; Last Rx:05Nov2013 7. Calcium 600 + D TABS; Therapy: (Recorded:22Jan2013) to 8. FaBB 2.2-25-1 MG Oral Tablet; Therapy: 27Mar2012 to 9.  Fexofenadine HCl 180 MG Oral Tablet; Therapy: 28Dec2010 to 10. Fish Oil CAPS; Therapy: (Recorded:24Jun2008) to 11. Flonase SUSP; Therapy: (Recorded:29Jan2008) to 12. Glucosamine 1500 Complex CAPS; Therapy: (Recorded:22Jan2013) to 13. Levitra 20 MG Oral Tablet; TAKE 1 TABLET PRN; Therapy: 29Apr2011 to (Last Rx:29Apr2011) 14. Multiple Vitamin TABS; Therapy: (Recorded:24Jun2008) to 15. Perphenazine 4 MG Oral Tablet; Therapy: 09Dec2012 to 16. ProAir HFA 108 (90 Base) MCG/ACT Inhalation Aerosol Solution; Therapy: 19Jun2013 to 17. QUEtiapine Fumarate 25 MG Oral Tablet; Therapy: 10Jun2013 to 18. Simvastatin 20 MG Oral Tablet; Therapy: 08Jul2013 to 19. Stool Softener CAPS; Therapy: (Recorded:29Nov2011) to 20. Vitamin D3 CAPS; Therapy: (Recorded:22Jan2013) to 21. Zegerid CAPS; Therapy: (Recorded:29Jan2008) to  Allergies Medication  1. Ibuprofen CAPS 2. Rapaflo CAPS 3. Penicillins  Family History Problems  1. Family history of  Family Health Status Number Of Children 2 sons  Social History Problems  1. Family history of  Death In The Family Father 12 2. Family history of  Death In The Family Mother 53 3. Marital History - Currently Married 4. Occupation: Engineer, water 5. Tobacco Use V15.82 1/2 pack a day for 6 yrs, quit 24 yrs ago  Review of Systems Genitourinary, constitutional, skin, eye, otolaryngeal, hematologic/lymphatic, cardiovascular, pulmonary, endocrine, musculoskeletal, gastrointestinal, neurological and psychiatric system(s) were reviewed and pertinent findings if present are noted.    Physical Exam Rectal: Rectal exam demonstrates normal sphincter tone, no tenderness and no masses. Estimated prostate size is 3+. Normal rectal tone, no rectal masses, prostate is smooth, symmetric and non-tender. The prostate has no nodularity and is not tender. The left seminal vesicle  is nonpalpable. The right seminal vesicle is nonpalpable. The perineum is normal on inspection.     Results/Data  Flow Rate: Voided 335 ml. A peak flow rate of 17ml/s and mean flow rate of 44ml/s.  PVR: Ultrasound PVR 115.83 ml.    Procedure  PUS - 42.51 grams   Procedure: Cystoscopy  Chaperone Present: laura.  Indication: Lower Urinary Tract Symptoms.  Informed Consent: Risks, benefits, and potential adverse events were discussed and informed consent was obtained from the patient.  Prep: The patient was prepped with betadine.  Anesthesia:. Local anesthesia was administered intraurethrally with 2% lidocaine jelly.  Antibiotic prophylaxis: Ciprofloxacin.  Procedure Note:  Urethral meatus:. No abnormalities.  Anterior urethra: No abnormalities.  Prostatic urethra:. The lateral prostatic lobes were enlarged. No intravesical median lobe was visualized.  Bladder: Visulization was clear. The ureteral orifices were in the normal anatomic position bilaterally. A systematic survey of the bladder demonstrated no bladder tumors or stones. Examination of the bladder demonstrated no trabeculation and no diverticulum. The patient tolerated the procedure well.  Complications: None.    Assessment Assessed  1. Benign Prostatic Hyperplasia Localized With Urinary Obstruction With Other Lower Urinary Tract  Symptoms 600.21 2. 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 PVR= 115.83, and peak flow rate 9cc/sed. Cysto shows no strictue, and bilobar BPH.   Plan  Green-light laser bladder neck incisions.   Signatures Electronically signed by : [name], M.D.; Mar 16 2012  4:30PM

## 2012-06-04 ENCOUNTER — Encounter (HOSPITAL_COMMUNITY): Payer: Self-pay | Admitting: Urology

## 2012-07-16 ENCOUNTER — Encounter: Payer: Self-pay | Admitting: Cardiovascular Disease

## 2012-07-16 ENCOUNTER — Ambulatory Visit (INDEPENDENT_AMBULATORY_CARE_PROVIDER_SITE_OTHER): Payer: BC Managed Care – PPO | Admitting: Cardiovascular Disease

## 2012-07-16 VITALS — BP 108/80 | HR 76 | Ht 73.5 in | Wt 211.0 lb

## 2012-07-16 DIAGNOSIS — R0789 Other chest pain: Secondary | ICD-10-CM

## 2012-07-16 NOTE — Patient Instructions (Addendum)
Your physician has requested that you have an echocardiogram  IN 1 YEAR/ 1 WEEK PRIOR TO OFFICE VISIT. Echocardiography is a painless test that uses sound waves to create images of your heart. It provides your doctor with information about the size and shape of your heart and how well your heart's chambers and valves are working. This procedure takes approximately one hour. There are no restrictions for this procedure.  Your physician wants you to follow-up in: 1 YEAR You will receive a reminder letter in the mail two months in advance. If you don't receive a letter, please call our office to schedule the follow-up appointment.   Your physician recommends that you continue on your current medications as directed. Please refer to the Current Medication list given to you today.

## 2012-07-16 NOTE — Assessment & Plan Note (Signed)
Resistant well. He's not had any further episodes of chest tightness. We have performed a cardiac catheterization several years ago which revealed no significant for CAD. His Myoview studies have been chronically normal and unchanged from his previous study.  I see him again in one year. We'll check an echocardiogram before I see him again in one year.

## 2012-07-16 NOTE — Progress Notes (Signed)
Aaron Stout Date of Birth  1955/03/30       [facility]    [place] [address], [address]  [address] [place], [place]  [address]   [place], [place]  [address] [phone]     [phone]   Fax  [phone]    Fax [phone]  Problem List: 1. Syncope 2. Mild to moderate coronary artery disease by heart catheterization ( 2010) 3. Bipolar disease 4. Sleep apnea 5. Hyperlipidemia- he tried  History of Present Illness:  Aaron Stout is doing well.  He denies any episodes of chest pain or shortness breath.  He's able to do his normal activities without any significant problems but he is not exercising on a regular basis.  He stop his atorvastatin several weeks ago because it seemed to exacerbate his trigger finger condition.  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  He does oil painting during the summer months.  April 05, 2012: Aaron Stout presents today with some vague chest aches, fatigue, palpitations.   These episodes are not related to exertion,  Lasts for 2-3 seconds.  He has a hx of hiatal hernia.  He also has some palpitations  Jul 16, 2012:   Aaron Stout presents for further evaluation of this chest pain.   He had a Myoview study which was read it read out as intermediate risk.  He has a EF of 46%.  He had no evidence of ischemia. He had mildly depressed left ventricle systolic function. This stress test is was unchanged from his previous stress test many years ago. He has had an unremarkable cardiac catheterization in the past.   Current Outpatient Prescriptions on File Prior to Visit  Medication Sig Dispense Refill  . acyclovir (ZOVIRAX) 400 MG tablet Take 400 mg by mouth 2 (two) times daily.        Marland Kitchen amphetamine-dextroamphetamine (ADDERALL XR) 30 MG 24 hr capsule Take 60 mg by mouth every morning.       Marland Kitchen aspirin 325 MG tablet Take 325 mg by mouth at bedtime.       Marland Kitchen azelastine (ASTELIN) 137 MCG/SPRAY nasal spray 1 spray by  Nasal route 2 (two) times daily. Use in each nostril as directed       . cholecalciferol (VITAMIN D) 1000 UNITS tablet Take 1,000 Units by mouth daily.      Marland Kitchen docusate sodium (COLACE) 100 MG capsule Take 100 mg by mouth 2 (two) times daily.      . fexofenadine (ALLEGRA) 180 MG tablet Take 180 mg by mouth daily.        . fish oil-omega-3 fatty acids 1000 MG capsule Take 1 g by mouth 2 (two) times daily. 2 BID      . fluticasone (FLONASE) 50 MCG/ACT nasal spray 2 sprays by Nasal route 2 (two) times daily.        . Glucosamine-Chondroit-Vit C-Mn (GLUCOSAMINE 1500 COMPLEX PO) Take 1 tablet by mouth daily.       . Multiple Vitamin (MULTIVITAMIN) tablet Take 1 tablet by mouth daily.        . Nutritional Supplements (DHEA PO) Take 15 mg by mouth daily.      Marland Kitchen omeprazole-sodium bicarbonate (ZEGERID) 40-1100 MG per capsule Take 1 capsule by mouth 2 (two) times daily.        Marland Kitchen OVER THE COUNTER MEDICATION Take 50 mg by mouth daily. Pregnenolone supplement      . OVER THE COUNTER MEDICATION Take 1  capsule by mouth daily. methylfolate 20mg  /cobalamin 1mg       . perphenazine (TRILAFON) 2 MG tablet Take 6 mg by mouth daily.      . phenazopyridine (PYRIDIUM) 200 MG tablet Take 1 tablet (200 mg total) by mouth 3 (three) times daily as needed for pain.  30 tablet  0  . Psyllium (FIBER) 0.52 G CAPS Take 6 capsules by mouth daily.       Marland Kitchen pyridOXINE (VITAMIN B-6) 100 MG tablet Take 100 mg by mouth daily.      . QUEtiapine (SEROQUEL) 50 MG tablet Take 25 mg by mouth 2 (two) times daily.       . simvastatin (ZOCOR) 20 MG tablet Take 20 mg by mouth every evening.       No current facility-administered medications on file prior to visit.    Allergies  Allergen Reactions  . Cymbalta (Duloxetine Hcl) Other (See Comments)    drowsiness  . Ibuprofen     REACTION: hives  . Penicillins     REACTION: unknown    Past Medical History  Diagnosis Date  . Syncope     last episode 1 yr ago-evaluated by  cardiologist-negative findings-?related to low B/p (hydration tends to help).  . Coronary artery disease   . Bipolar 1 disorder   . Hypercholesterolemia   . Depression   . Allergic rhinitis   . Prostatitis   . Asthma   . IBS (irritable bowel syndrome)   . Internal and external hemorrhoids without complication   . Adenomatous colon polyp 2001  . GERD (gastroesophageal reflux disease)   . Radiculopathy of cervical spine   . History of degenerative disc disease   . Chronic fatigue syndrome   . Diverticulosis   . Anxiety   . Chronic headaches     d/t old neck injury  . Fibromyalgia   . Stiff neck   . Cancer     hx skin cancer  . HOH (hard of hearing)     slight-bilateral  . Complication of anesthesia     x1 episode "panic attack" awakening-none in recent years  . Dysrhythmia     was told benign by cardiologist  . Sleep apnea     better s/p UPP-no problems now  . Arthritis     arthritis-hands    Past Surgical History  Procedure Laterality Date  . Uvulopalatopharyngoplasty  1990  . Nasal polyp excision    . 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 Back surgery  1975, 1985  . Colonoscopy w/ biopsies  multiple  . Esophagogastroduodenoscopy  multiple  . Cardiac catheterization  05/2008    negative  . Green light laser turp (transurethral resection of prostate N/A 06/01/2012    Procedure: GREEN LIGHT LASER OF PROSTATE ;  Surgeon: Kathi Ludwig, MD;  Location: [facility];  Service: Urology;  Laterality: N/A;    History  Smoking status  . Former Smoker  . Quit date: 07/06/1980  Smokeless tobacco  . Never Used    History  Alcohol Use No    Family History  Problem Relation Age of Onset  . Breast cancer Mother 67  . Heart failure Father 32  . Heart disease      grandfather/uncle  . Alcoholism      greatgrandfather/uncle    . Colon polyps      uncle    Reviw of Systems:  Reviewed in the HPI.  All other systems are negative.  Physical  Exam: Blood pressure 108/80, pulse 76, height 6' 1.5" (1.867 m), weight 211 lb (95.709 kg), SpO2 99.00%. General: Well developed, well nourished, in no acute distress.  Head: Normocephalic, atraumatic, sclera non-icteric, mucus membranes are moist,   Neck: Supple. Carotids are 2 + without bruits. No JVD  Lungs: Clear bilaterally to auscultation.  Heart: regular rate.  normal  S1 S2. No murmurs, gallops or rubs.  Abdomen: Soft, non-tender, non-distended with normal bowel sounds. No hepatomegaly. No rebound/guarding. No masses.  Msk:  Strength and tone are normal  Extremities: No clubbing or cyanosis. No edema.  Distal pedal pulses are 2+ and equal bilaterally.  Neuro: Alert and oriented X 3. Moves all extremities spontaneously.  Psych:  Responds to questions appropriately with a normal affect.  ECG: April 05, 2012: -normal sinus rhythm at 81 beats a minute. He has left axis deviation. There is a pulmonary disease pattern.  Assessment / Plan:

## 2012-08-01 ENCOUNTER — Other Ambulatory Visit: Payer: Self-pay | Admitting: Dermatology

## 2013-01-10 ENCOUNTER — Other Ambulatory Visit: Payer: Self-pay

## 2013-02-05 ENCOUNTER — Encounter: Payer: Self-pay | Admitting: Internal Medicine

## 2013-05-31 ENCOUNTER — Encounter: Payer: Self-pay | Admitting: Internal Medicine

## 2013-08-30 ENCOUNTER — Encounter: Payer: Self-pay | Admitting: Internal Medicine

## 2013-08-30 ENCOUNTER — Ambulatory Visit (AMBULATORY_SURGERY_CENTER): Payer: Self-pay

## 2013-08-30 VITALS — Ht 74.0 in | Wt 224.0 lb

## 2013-08-30 DIAGNOSIS — Z860100 Personal history of colon polyps, unspecified: Secondary | ICD-10-CM

## 2013-08-30 MED ORDER — SUPREP BOWEL PREP KIT 17.5-3.13-1.6 GM/177ML PO SOLN: 1.0000 | Freq: Once | ORAL | Status: DC

## 2013-08-30 NOTE — Progress Notes (Signed)
No allergies to eggs or soy No diet/weight loss meds No home oxygen PONV with general anesthesia  Has email  Emmi instructions given for colonoscopy

## 2013-09-11 ENCOUNTER — Encounter: Payer: Self-pay | Admitting: Internal Medicine

## 2013-09-11 ENCOUNTER — Ambulatory Visit (AMBULATORY_SURGERY_CENTER): Payer: BC Managed Care – PPO | Admitting: Internal Medicine

## 2013-09-11 VITALS — BP 144/89 | HR 69 | Temp 95.7°F | Resp 14 | Ht 74.0 in | Wt 224.0 lb

## 2013-09-11 DIAGNOSIS — D126 Benign neoplasm of colon, unspecified: Secondary | ICD-10-CM

## 2013-09-11 DIAGNOSIS — Z86010 Personal history of colonic polyps: Secondary | ICD-10-CM

## 2013-09-11 DIAGNOSIS — K5730 Diverticulosis of large intestine without perforation or abscess without bleeding: Secondary | ICD-10-CM

## 2013-09-11 DIAGNOSIS — K648 Other hemorrhoids: Secondary | ICD-10-CM

## 2013-09-11 HISTORY — DX: Other hemorrhoids: K64.8

## 2013-09-11 MED ORDER — SODIUM CHLORIDE 0.9 % IV SOLN: 500.0000 mL | INTRAVENOUS | Status: DC

## 2013-09-11 NOTE — Op Note (Signed)
[place]  [address] [name], [address]   COLONOSCOPY PROCEDURE REPORT  PATIENT: Culley, [name]  MR#: [id number] BIRTHDATE: 29-Jun-1955 , 58  yrs. old GENDER: Male ENDOSCOPIST: [name], MD, [facility] PROCEDURE DATE:  09/11/2013 PROCEDURE:   Colonoscopy with biopsy and snare polypectomy First Screening Colonoscopy - Avg.  risk and is 50 yrs.  old or older - No.  Prior Negative Screening - Now for repeat screening. N/A  History of Adenoma - Now for follow-up colonoscopy & has been > or = to 3 yrs.  Yes hx of adenoma.  Has been 3 or more years since last colonoscopy.  Polyps Removed Today? Yes. ASA CLASS:   Class II INDICATIONS:Patient's personal history of adenomatous colon polyps.  MEDICATIONS: propofol (Diprivan) 300mg  IV, MAC sedation, administered by CRNA, and These medications were titrated to patient response per physician's verbal order  DESCRIPTION OF PROCEDURE:   After the risks benefits and alternatives of the procedure were thoroughly explained, informed consent was obtained.  A digital rectal exam revealed no abnormalities of the rectum, A digital rectal exam revealed no prostatic nodules, and A digital rectal exam revealed the prostate was not enlarged.   The LB [id number]  endoscope was introduced through the anus and advanced to the cecum, which was identified by both the appendix and ileocecal valve. No adverse events experienced.   The quality of the prep was Suprep adequate The instrument was then slowly withdrawn as the colon was fully examined.  COLON FINDINGS: Seven sessile polyps measuring 2-6 mm in size were found in the ascending colon (1) , transverse colon (4), and descending colon (2).  A polypectomy was performed with cold forceps and with a cold snare.  The resection was complete and the polyp tissue was completely retrieved.   Severe diverticulosis was noted in the sigmoid colon.   The colon mucosa was  otherwise normal.  Retroflexed views revealed internal hemorrhoids. The time to cecum=3 minutes 19 seconds.  Withdrawal time=17 minutes 17 seconds.  The scope was withdrawn and the procedure completed. COMPLICATIONS: There were no complications.    ENDOSCOPIC IMPRESSION: 1.   Seven sessile polyps measuring 2-6 mm in size were found in the ascending colon, transverse colon, and descending colon; polypectomy was performed with cold forceps and with a cold snare 2.   Severe diverticulosis was noted in the sigmoid colon 3.   The colon mucosa was otherwise normal - adequate prep - hx adenomas  RECOMMENDATIONS: 1.  Timing of repeat colonoscopy will be determined by pathology findings. 2.   Call office if desire hemorrhoid evaluation and treatment   eSigned:  [name], MD, [facility] 09/11/2013 10:00 AM   cc: Janalyn Rouse, MD and The Patient   PATIENT NAME:  Aaron Stout, Aaron Stout MR#: [id number]

## 2013-09-11 NOTE — Progress Notes (Signed)
Called to room to assist during endoscopic procedure.  Patient ID and intended procedure confirmed with present staff. Received instructions for my participation in the procedure from the performing physician.  

## 2013-09-11 NOTE — Progress Notes (Signed)
A/ox3, pleased with MAC, report to RN 

## 2013-09-11 NOTE — Patient Instructions (Addendum)
I found and removed 7 small polyps that look benign. You also have a condition called diverticulosis - common and not usually a problem. Please read the handout provided.  Internal hemorrhoids also seen.I am able to treat those with an in-office procedure. If you like, please call my office at [phone] to schedule an appointment and I can evaluate you further.  I will let you know pathology results and when to have another routine colonoscopy by mail.  I appreciate the opportunity to care for you. [name], MD, [facility]  Handouts given on Hemorrhoids, diverticulosis, high fiber diet, and polyps.  YOU HAD AN ENDOSCOPIC PROCEDURE TODAY AT [facility]: Refer to the procedure report that was given to you for any specific questions about what was found during the examination.  If the procedure report does not answer your questions, please call your gastroenterologist to clarify.  If you requested that your care partner not be given the details of your procedure findings, then the procedure report has been included in a sealed envelope for you to review at your convenience later.  YOU SHOULD EXPECT: Some feelings of bloating in the abdomen. Passage of more gas than usual.  Walking can help get rid of the air that was put into your GI tract during the procedure and reduce the bloating. If you had a lower endoscopy (such as a colonoscopy or flexible sigmoidoscopy) you may notice spotting of blood in your stool or on the toilet paper. If you underwent a bowel prep for your procedure, then you may not have a normal bowel movement for a few days.  DIET: Your first meal following the procedure should be a light meal and then it is ok to progress to your normal diet.  A half-sandwich or bowl of soup is an example of a good first meal.  Heavy or fried foods are harder to digest and may make you feel nauseous or bloated.  Likewise meals heavy in dairy and vegetables can cause extra gas to form  and this can also increase the bloating.  Drink plenty of fluids but you should avoid alcoholic beverages for 24 hours.  ACTIVITY: Your care partner should take you home directly after the procedure.  You should plan to take it easy, moving slowly for the rest of the day.  You can resume normal activity the day after the procedure however you should NOT DRIVE or use heavy machinery for 24 hours (because of the sedation medicines used during the test).    SYMPTOMS TO REPORT IMMEDIATELY: A gastroenterologist can be reached at any hour.  During normal business hours, 8:30 AM to 5:00 PM Monday through Friday, call [phone].  After hours and on weekends, please call the GI answering service at [phone] who will take a message and have the physician on call contact you.   Following lower endoscopy (colonoscopy or flexible sigmoidoscopy):  Excessive amounts of blood in the stool  Significant tenderness or worsening of abdominal pains  Swelling of the abdomen that is new, acute  Fever of 100F or higher  Following upper endoscopy (EGD)  Vomiting of blood or coffee ground material  New chest pain or pain under the shoulder blades  Painful or persistently difficult swallowing  New shortness of breath  Fever of 100F or higher  Black, tarry-looking stools  FOLLOW UP: If any biopsies were taken you will be contacted by phone or by letter within the next 1-3 weeks.  Call your gastroenterologist  if you have not heard about the biopsies in 3 weeks.  Our staff will call the home number listed on your records the next business day following your procedure to check on you and address any questions or concerns that you may have at that time regarding the information given to you following your procedure. This is a courtesy call and so if there is no answer at the home number and we have not heard from you through the emergency physician on call, we will assume that you have returned to your regular  daily activities without incident.  SIGNATURES/CONFIDENTIALITY: You and/or your care partner have signed paperwork which will be entered into your electronic medical record.  These signatures attest to the fact that that the information above on your After Visit Summary has been reviewed and is understood.  Full responsibility of the confidentiality of this discharge information lies with you and/or your care-partner.

## 2013-09-12 ENCOUNTER — Telehealth: Payer: Self-pay | Admitting: *Deleted

## 2013-09-12 NOTE — Telephone Encounter (Signed)
  Follow up Call-  Call back number 09/11/2013 03/09/2012  Post procedure Call Back phone  # [phone]  Permission to leave phone message Yes Yes     Patient questions:  Do you have a fever, pain , or abdominal swelling? No. Pain Score  0 *  Have you tolerated food without any problems? Yes.    Have you been able to return to your normal activities? Yes.    Do you have any questions about your discharge instructions: Diet   No. Medications  No. Follow up visit  No.  Do you have questions or concerns about your Care? No.  Actions: * If pain score is 4 or above: No action needed, pain <4.

## 2013-09-16 ENCOUNTER — Encounter: Payer: Self-pay | Admitting: Internal Medicine

## 2013-09-16 NOTE — Progress Notes (Signed)
Quick Note:  7 small adenomas Repeat colon 2018 ______

## 2013-12-04 ENCOUNTER — Encounter: Payer: Self-pay | Admitting: Internal Medicine

## 2014-08-20 ENCOUNTER — Telehealth: Payer: Self-pay | Admitting: Internal Medicine

## 2014-08-20 NOTE — Telephone Encounter (Signed)
Contacted by [place] [facility] tonight at 11pm Pt developed acute, severe upper abd pain which started very shortly after dinner. Apparently 3rd such episode within the last few weeks/months Mild nausea, no vomiting.  No diarrhea and no bleeding/melena Feels clammy without def fever Pain now present x 3.5 hrs.  Not really getting better Denies freq NSAIDs  I advised ED visit for evaluation.  R/O cholecystitis/pancreatitis/appendicitis Office to contact patient tomorrow am for followup and further testing as necessary

## 2014-08-21 ENCOUNTER — Emergency Department (HOSPITAL_COMMUNITY): Payer: BLUE CROSS/BLUE SHIELD

## 2014-08-21 ENCOUNTER — Emergency Department (HOSPITAL_COMMUNITY)
Admission: EM | Admit: 2014-08-21 | Discharge: 2014-08-21 | Disposition: A | Discharge status: Home / Self Care | Payer: BLUE CROSS/BLUE SHIELD | Attending: Emergency Medicine | Admitting: Emergency Medicine

## 2014-08-21 ENCOUNTER — Encounter (HOSPITAL_COMMUNITY): Payer: Self-pay | Admitting: Emergency Medicine

## 2014-08-21 DIAGNOSIS — G8929 Other chronic pain: Secondary | ICD-10-CM | POA: Diagnosis not present

## 2014-08-21 DIAGNOSIS — Z9889 Other specified postprocedural states: Secondary | ICD-10-CM | POA: Diagnosis not present

## 2014-08-21 DIAGNOSIS — Z7951 Long term (current) use of inhaled steroids: Secondary | ICD-10-CM | POA: Insufficient documentation

## 2014-08-21 DIAGNOSIS — I2510 Atherosclerotic heart disease of native coronary artery without angina pectoris: Secondary | ICD-10-CM | POA: Insufficient documentation

## 2014-08-21 DIAGNOSIS — R1013 Epigastric pain: Secondary | ICD-10-CM | POA: Diagnosis present

## 2014-08-21 DIAGNOSIS — F319 Bipolar disorder, unspecified: Secondary | ICD-10-CM | POA: Insufficient documentation

## 2014-08-21 DIAGNOSIS — Z85828 Personal history of other malignant neoplasm of skin: Secondary | ICD-10-CM | POA: Insufficient documentation

## 2014-08-21 DIAGNOSIS — Z880 Allergy status to penicillin: Secondary | ICD-10-CM | POA: Insufficient documentation

## 2014-08-21 DIAGNOSIS — E785 Hyperlipidemia, unspecified: Secondary | ICD-10-CM | POA: Diagnosis not present

## 2014-08-21 DIAGNOSIS — K219 Gastro-esophageal reflux disease without esophagitis: Secondary | ICD-10-CM | POA: Insufficient documentation

## 2014-08-21 DIAGNOSIS — M1990 Unspecified osteoarthritis, unspecified site: Secondary | ICD-10-CM | POA: Insufficient documentation

## 2014-08-21 DIAGNOSIS — E780 Pure hypercholesterolemia: Secondary | ICD-10-CM | POA: Diagnosis not present

## 2014-08-21 DIAGNOSIS — Z87891 Personal history of nicotine dependence: Secondary | ICD-10-CM | POA: Diagnosis not present

## 2014-08-21 DIAGNOSIS — J45909 Unspecified asthma, uncomplicated: Secondary | ICD-10-CM | POA: Diagnosis not present

## 2014-08-21 DIAGNOSIS — Z86010 Personal history of colonic polyps: Secondary | ICD-10-CM | POA: Insufficient documentation

## 2014-08-21 DIAGNOSIS — K8020 Calculus of gallbladder without cholecystitis without obstruction: Secondary | ICD-10-CM | POA: Diagnosis not present

## 2014-08-21 DIAGNOSIS — Z7982 Long term (current) use of aspirin: Secondary | ICD-10-CM | POA: Diagnosis not present

## 2014-08-21 DIAGNOSIS — F419 Anxiety disorder, unspecified: Secondary | ICD-10-CM | POA: Diagnosis not present

## 2014-08-21 DIAGNOSIS — R1011 Right upper quadrant pain: Secondary | ICD-10-CM

## 2014-08-21 LAB — COMPREHENSIVE METABOLIC PANEL
ALT: 26 U/L (ref 17–63)
AST: 32 U/L (ref 15–41)
Albumin: 4.4 g/dL (ref 3.5–5.0)
Alkaline Phosphatase: 34 U/L — ABNORMAL LOW (ref 38–126)
Anion gap: 12 (ref 5–15)
BUN: 18 mg/dL (ref 6–20)
CO2: 24 mmol/L (ref 22–32)
Calcium: 10 mg/dL (ref 8.9–10.3)
Chloride: 104 mmol/L (ref 101–111)
Creatinine, Ser: 1.14 mg/dL (ref 0.61–1.24)
GFR calc Af Amer: >60 mL/min (ref >60)
GFR calc non Af Amer: >60 mL/min (ref >60)
Glucose, Bld: 101 mg/dL — ABNORMAL HIGH (ref 65–99)
Potassium: 4.1 mmol/L (ref 3.5–5.1)
Sodium: 140 mmol/L (ref 135–145)
Total Bilirubin: 0.8 mg/dL (ref 0.3–1.2)
Total Protein: 7.7 g/dL (ref 6.5–8.1)

## 2014-08-21 LAB — URINALYSIS, ROUTINE W REFLEX MICROSCOPIC
Bilirubin Urine: NEGATIVE
Glucose, UA: NEGATIVE mg/dL
Hgb urine dipstick: NEGATIVE
Ketones, ur: NEGATIVE mg/dL
Leukocytes, UA: NEGATIVE
Nitrite: NEGATIVE
Protein, ur: NEGATIVE mg/dL
Specific Gravity, Urine: 1.024 (ref 1.005–1.030)
Urobilinogen, UA: 0.2 mg/dL (ref 0.0–1.0)
pH: 6 (ref 5.0–8.0)

## 2014-08-21 LAB — CBC WITH DIFFERENTIAL/PLATELET
Basophils Absolute: 0.1 10*3/uL (ref 0.0–0.1)
Basophils Relative: 1 % (ref 0–1)
Eosinophils Absolute: 0.4 10*3/uL (ref 0.0–0.7)
Eosinophils Relative: 5 % (ref 0–5)
HCT: 48.1 % (ref 39.0–52.0)
Hemoglobin: 16.4 g/dL (ref 13.0–17.0)
Lymphocytes Relative: 23 % (ref 12–46)
Lymphs Abs: 2.1 10*3/uL (ref 0.7–4.0)
MCH: 31.7 pg (ref 26.0–34.0)
MCHC: 34.1 g/dL (ref 30.0–36.0)
MCV: 93 fL (ref 78.0–100.0)
Monocytes Absolute: 0.7 10*3/uL (ref 0.1–1.0)
Monocytes Relative: 8 % (ref 3–12)
Neutro Abs: 5.6 10*3/uL (ref 1.7–7.7)
Neutrophils Relative %: 63 % (ref 43–77)
Platelets: 227 10*3/uL (ref 150–400)
RBC: 5.17 MIL/uL (ref 4.22–5.81)
RDW: 13.1 % (ref 11.5–15.5)
WBC: 8.8 10*3/uL (ref 4.0–10.5)

## 2014-08-21 LAB — LIPASE, BLOOD: Lipase: 17 U/L — ABNORMAL LOW (ref 22–51)

## 2014-08-21 LAB — I-STAT TROPONIN, ED: Troponin i, poc: 0 ng/mL (ref 0.00–0.08)

## 2014-08-21 MED ORDER — DICYCLOMINE HCL 20 MG PO TABS: 20.0000 mg | ORAL_TABLET | Freq: Two times a day (BID) | ORAL | Status: DC

## 2014-08-21 MED ORDER — ONDANSETRON HCL 4 MG PO TABS: 4.0000 mg | ORAL_TABLET | Freq: Four times a day (QID) | ORAL | Status: DC

## 2014-08-21 MED ORDER — GI COCKTAIL ~~LOC~~
30.0000 mL | Freq: Once | ORAL | Status: AC
  Administered 2014-08-21: 30 mL via ORAL
  Filled 2014-08-21: qty 30

## 2014-08-21 MED ORDER — DICYCLOMINE HCL 10 MG PO CAPS
10.0000 mg | ORAL_CAPSULE | Freq: Once | ORAL | Status: AC
  Administered 2014-08-21: 10 mg via ORAL
  Filled 2014-08-21: qty 1

## 2014-08-21 NOTE — ED Notes (Signed)
Pt c/o mid abd pain with nausea.  No vomiting or diarrhea.

## 2014-08-21 NOTE — ED Notes (Signed)
EDPA PRESENT at bedside. 

## 2014-08-21 NOTE — Discharge Instructions (Signed)
Abdominal Pain Many things can cause abdominal pain. Usually, abdominal pain is not caused by a disease and will improve without treatment. It can often be observed and treated at home. Your health care provider will do a physical exam and possibly order blood tests and X-rays to help determine the seriousness of your pain. However, in many cases, more time must pass before a clear cause of the pain can be found. Before that point, your health care provider may not know if you need more testing or further treatment. HOME CARE INSTRUCTIONS  Monitor your abdominal pain for any changes. The following actions may help to alleviate any discomfort you are experiencing:  Only take over-the-counter or prescription medicines as directed by your health care provider.  Do not take laxatives unless directed to do so by your health care provider.  Try a clear liquid diet (broth, tea, or water) as directed by your health care provider. Slowly move to a bland diet as tolerated. SEEK MEDICAL CARE IF:  You have unexplained abdominal pain.  You have abdominal pain associated with nausea or diarrhea.  You have pain when you urinate or have a bowel movement.  You experience abdominal pain that wakes you in the night.  You have abdominal pain that is worsened or improved by eating food.  You have abdominal pain that is worsened with eating fatty foods.  You have a fever. SEEK IMMEDIATE MEDICAL CARE IF:   Your pain does not go away within 2 hours.  You keep throwing up (vomiting).  Your pain is felt only in portions of the abdomen, such as the right side or the left lower portion of the abdomen.  You pass bloody or black tarry stools. MAKE SURE YOU:  Understand these instructions.   Will watch your condition.   Will get help right away if you are not doing well or get worse.  Document Released: 12/01/2004 Document Revised: 02/26/2013 Document Reviewed: 10/31/2012 [facility] Patient Information  2015 [place], [place]. This information is not intended to replace advice given to you by your health care provider. Make sure you discuss any questions you have with your health care provider.  Cholelithiasis Cholelithiasis (also called gallstones) is a form of gallbladder disease. The gallbladder is a small organ that helps you digest fats. Symptoms of gallstones are:  Feeling sick to your stomach (nausea).  Throwing up (vomiting).  Belly pain.  Yellowing of the skin (jaundice).  Sudden pain. You may feel the pain for minutes to hours.  Fever.  Pain to the touch. HOME CARE  Only take medicines as told by your doctor.  Eat a low-fat diet until you see your doctor again. Eating fat can result in pain.  Follow up with your doctor as told. Attacks usually happen time after time. Surgery is usually needed for permanent treatment. GET HELP RIGHT AWAY IF:   Your pain gets worse.  Your pain is not helped by medicines.  You have a fever and lasting symptoms for more than 2-3 days.  You have a fever and your symptoms suddenly get worse.  You keep feeling sick to your stomach and throwing up. MAKE SURE YOU:   Understand these instructions.  Will watch your condition.  Will get help right away if you are not doing well or get worse. Document Released: 08/10/2007 Document Revised: 10/24/2012 Document Reviewed: 08/15/2012 [facility] Patient Information 2015 [place], [place]. This information is not intended to replace advice given to you by your health care provider. Make sure  you discuss any questions you have with your health care provider.

## 2014-08-21 NOTE — ED Provider Notes (Signed)
CSN: [id number]     Arrival date & time 08/21/14  1403 History   First MD Initiated Contact with Patient 08/21/14 1521     Chief Complaint  Patient presents with  . Abdominal Pain     (Consider location/radiation/quality/duration/timing/severity/associated sxs/prior Treatment) Patient is a 59 y.o. male presenting with abdominal pain. The history is provided by the patient. No language interpreter was used.  Abdominal Pain Aaron Stout is a 59 y.o male with a history of CAD, hyperlipidemia, depression, IBS, anxiety who presents with intermittent abdominal pain for the past 2 months.  He states this is the 3rd episode and he usually has sharp epigastric pain after eating can last up to 2 weeks. He usually gets nausea and vomiting with the episodes.  He states this particular episode began last night after eating pineapple and was 10/10 at that time.  He took mylanta thinking it was an abdominal ulcer without relief.  He states his pain is now 5/10 with nausea.  He denies any fever, chills, chest pain, shortness of breath, hematemesis, diarrhea, constipation, hematochezia, dysuria, or hematuria. He denies any abdominal surgeries. He denies any smoking or alcohol abuse. He denies the use of NSAIDS.  He takes enteric coated aspirin.   Past Medical History  Diagnosis Date  . Syncope     last episode 1 yr ago-evaluated by cardiologist-negative findings-?related to low B/p (hydration tends to help).  . Coronary artery disease   . Bipolar 1 disorder   . Hypercholesterolemia   . Depression   . Allergic rhinitis   . Prostatitis   . Asthma   . IBS (irritable bowel syndrome)   . Internal and external hemorrhoids without complication   . Adenomatous colon polyp 2001  . GERD (gastroesophageal reflux disease)   . Radiculopathy of cervical spine   . History of degenerative disc disease   . Chronic fatigue syndrome   . Diverticulosis   . Anxiety   . Chronic headaches     d/t old neck injury  .  Fibromyalgia   . Stiff neck   . Cancer     hx skin cancer  . HOH (hard of hearing)     slight-bilateral  . Complication of anesthesia     x1 episode "panic attack" awakening-none in recent years  . Dysrhythmia     was told benign by cardiologist  . Sleep apnea     better s/p UPP-no problems now  . Arthritis     arthritis-hands   Past Surgical History  Procedure Laterality Date  . Uvulopalatopharyngoplasty  1990  . Nasal polyp excision    . 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 Back surgery  1975, 1985  . Colonoscopy w/ biopsies  multiple  . Esophagogastroduodenoscopy  multiple  . Cardiac catheterization  05/2008    negative  . Green light laser turp (transurethral resection of prostate N/A 06/01/2012    Procedure: GREEN LIGHT LASER OF PROSTATE ;  Surgeon: Ailene Rud, MD;  Location: [facility];  Service: Urology;  Laterality: N/A;   Family History  Problem Relation Age of Onset  . Breast cancer Mother 62  . Heart failure Father 55  . Heart disease      grandfather/uncle  . Alcoholism      greatgrandfather/uncle    . Colon polyps      uncle   History  Substance Use Topics  . Smoking status: Former Smoker    Quit date: 07/06/1980  . Smokeless  tobacco: Never Used  . Alcohol Use: No    Review of Systems  Gastrointestinal: Positive for abdominal pain.  Neurological: Negative for dizziness and light-headedness.  All other systems reviewed and are negative.     Allergies  Cymbalta; Ibuprofen; and Penicillins  Home Medications   Prior to Admission medications   Medication Sig Start Date End Date Taking? Authorizing Provider  amphetamine-dextroamphetamine (ADDERALL XR) 30 MG 24 hr capsule Take 60 mg by mouth every morning.    Yes Historical Provider, MD  aspirin 81 MG tablet Take 81 mg by mouth daily.   Yes Historical Provider, MD  azelastine (ASTELIN) 137 MCG/SPRAY nasal spray 1 spray by Nasal route 2 (two) times daily. Use in each nostril  as directed    Yes Historical Provider, MD  azithromycin (ZITHROMAX) 250 MG tablet Take 250 mg by mouth. Take 1 tablet bid Mon, Wed, Fri for 3 weeks then rest and continue, for suspected bacterial infection.   Yes Historical Provider, MD  cefdinir (OMNICEF) 300 MG capsule Take 300 mg by mouth. Take 1 tablet by mouth bid on Mon thru Fri for 3 weeks, rest then continue, for suspected bacterial infection.   Yes Historical Provider, MD  Cholecalciferol (VITAMIN D) 2000 UNITS tablet Take 2,000 Units by mouth daily.   Yes Historical Provider, MD  Coenzyme Q10 (CO Q-10) 100 MG CAPS Take 100 mg by mouth daily.   Yes Historical Provider, MD  CREAM BASE EX Apply 1 application topically daily. Apply 2.5 mg of Testosterone Cream Daily.   Yes Historical Provider, MD  DHEA 25 MG CAPS Take 1 capsule by mouth daily.   Yes Historical Provider, MD  docusate sodium (COLACE) 100 MG capsule Take 100 mg by mouth 2 (two) times daily.   Yes Historical Provider, MD  doxycycline (DORYX) 100 MG EC tablet Take 100 mg by mouth 3 (three) times a week. Take on Mon, Wed and Fri for 3 weeks. Rest 1 week then continue course.   Yes Historical Provider, MD  fexofenadine (ALLEGRA) 180 MG tablet Take 180 mg by mouth every evening.    Yes Historical Provider, MD  fluconazole (DIFLUCAN) 200 MG tablet Take 200 mg by mouth 2 (two) times a week. On Saturday and Sunday for gut health.   Yes Historical Provider, MD  fluticasone (FLONASE) 50 MCG/ACT nasal spray 2 sprays by Nasal route 2 (two) times daily.     Yes Historical Provider, MD  Glucosamine-Chondroit-Vit C-Mn (GLUCOSAMINE 1500 COMPLEX PO) Take 1 tablet by mouth daily.    Yes Historical Provider, MD  hydroxychloroquine (PLAQUENIL) 200 MG tablet Take 200 mg by mouth. Take 1 tablet by mouth daily Mon thru Fri for 3 weeks, rest then continue course, for suspected bacteria.   Yes Historical Provider, MD  l-methylfolate-B6-B12 (METANX) 3-35-2 MG TABS Take 1 tablet by mouth daily.   Yes  Historical Provider, MD  MAGNESIUM GLYCINATE PLUS PO Take 2 tablets by mouth at bedtime.   Yes Historical Provider, MD  metroNIDAZOLE (FLAGYL) 500 MG tablet Take 500 mg by mouth 2 (two) times a week. Take on Sat and Sun for gut health.   Yes Historical Provider, MD  Omega-3 Fatty Acids (FISH OIL PO) Take 2,000 mg by mouth 2 (two) times daily.   Yes Historical Provider, MD  Omeprazole-Sodium Bicarbonate (ZEGERID) 20-1100 MG CAPS capsule Take 1 capsule by mouth daily before breakfast.   Yes Historical Provider, MD  OVER THE COUNTER MEDICATION Take 50 mg by mouth daily. Pregnenolone supplement  Yes Historical Provider, MD  OVER THE COUNTER MEDICATION Sam-e l-methionine LBCore protocol George Hugh, [place]   Yes Historical Provider, MD  perphenazine (TRILAFON) 2 MG tablet Take 6 mg by mouth at bedtime.    Yes Historical Provider, MD  Pregnenolone POWD 100 mg by Does not apply route daily.   Yes Historical Provider, MD  Probiotic Product (PROBIOTIC DAILY PO) Take 1 tablet by mouth daily as needed.   Yes Historical Provider, MD  Psyllium (FIBER) 0.52 G CAPS Take 6 capsules by mouth every evening.    Yes Historical Provider, MD  pyridOXINE (VITAMIN B-6) 100 MG tablet Take 100 mg by mouth daily.   Yes Historical Provider, MD  QUEtiapine (SEROQUEL) 50 MG tablet Take 50 mg by mouth at bedtime.    Yes Historical Provider, MD  valACYclovir (VALTREX) 500 MG tablet Take 500 mg by mouth 2 (two) times daily.   Yes Historical Provider, MD  dicyclomine (BENTYL) 20 MG tablet Take 1 tablet (20 mg total) by mouth 2 (two) times daily. 08/21/14   Aaron Szymanowski Patel-Mills, PA-C  ondansetron (ZOFRAN) 4 MG tablet Take 1 tablet (4 mg total) by mouth every 6 (six) hours. 08/21/14   Aaron Dorce Patel-Mills, PA-C  phenazopyridine (PYRIDIUM) 200 MG tablet Take 1 tablet (200 mg total) by mouth 3 (three) times daily as needed for pain. Patient not taking: Reported on 08/21/2014 06/01/12   Carolan Clines, MD   BP 136/87 mmHg  Pulse 72   Temp(Src) 98.4 F (36.9 C) (Oral)  Resp 18  Ht 6\' 2"  (1.88 m)  Wt 226 lb (102.513 kg)  BMI 29.00 kg/m2  SpO2 100% Physical Exam  Constitutional: He is oriented to person, place, and time. He appears well-developed and well-nourished.  HENT:  Head: Normocephalic and atraumatic.  Eyes: Conjunctivae are normal.  Neck: Normal range of motion. Neck supple.  Cardiovascular: Normal rate, regular rhythm and normal heart sounds.   Pulmonary/Chest: Effort normal and breath sounds normal.  Abdominal: Soft. Bowel sounds are normal. He exhibits no distension. There is tenderness in the epigastric area. There is no rigidity, no rebound, no guarding, no tenderness at McBurney's point and negative Murphy's sign.    Musculoskeletal: Normal range of motion.  Neurological: He is alert and oriented to person, place, and time.  Skin: Skin is warm and dry.  Nursing note and vitals reviewed.   ED Course  Procedures (including critical care time) Labs Review Labs Reviewed  COMPREHENSIVE METABOLIC PANEL - Abnormal; Notable for the following:    Glucose, Bld 101 (*)    Alkaline Phosphatase 34 (*)    All other components within normal limits  LIPASE, BLOOD - Abnormal; Notable for the following:    Lipase 17 (*)    All other components within normal limits  URINALYSIS, ROUTINE W REFLEX MICROSCOPIC (NOT AT [facility]) - Abnormal; Notable for the following:    Color, Urine AMBER (*)    All other components within normal limits  CBC WITH DIFFERENTIAL/PLATELET  Randolm Idol, ED    Imaging Review Dg Abd 1 View  08/21/2014   CLINICAL DATA:  59 year old male with epigastric and umbilical pain for the past 3 days with nausea, vomiting and constipation.  EXAM: ABDOMEN - 1 VIEW  COMPARISON:  No priors.  FINDINGS: Gas and stool are seen scattered throughout the colon extending to the level of the distal rectum. No pathologic distension of small bowel is noted. Several nondilated loops of gas-filled small bowel  are noted throughout the central abdomen. No  gross evidence of pneumoperitoneum.  IMPRESSION: 1. Nonobstructive bowel gas pattern. 2. No pneumoperitoneum.   Electronically Signed   By: Vinnie Langton M.D.   On: 08/21/2014 18:46   US Abdomen Limited Ruq  08/21/2014   CLINICAL DATA:  Right upper quadrant and epigastric pain with nausea. Initial encounter.  EXAM: US ABDOMEN LIMITED - RIGHT UPPER QUADRANT  COMPARISON:  Radiograph same date.  No comparison studies.  FINDINGS: Gallbladder:  Multiple gallstones are present. There is mild pericholecystic fluid, but no gallbladder wall thickening or sonographic Murphy's sign.  Common bile duct:  Diameter: 1.8 mm.  No evidence of choledocholithiasis.  Liver:  Diffusely increased echogenicity consistent with steatosis. There is probable sparing around the gallbladder. Within the left lobe, there is a mildly septated cyst measuring 1.4 cm maximally.  IMPRESSION: 1. Cholelithiasis without sonographic evidence of cholecystitis. 2. Fatty liver with cyst in the left lobe.   Electronically Signed   By: [name] M.D.   On: 08/21/2014 18:44     EKG Interpretation None      MDM   Final diagnoses:  Epigastric pain  Calculus of gallbladder without cholecystitis without obstruction  Patient presents for intermittent epigastric pain for 2 months that is worse after eating.  No particularly identifiable foods cause the pain. Patient is sitting comfortably in a chair and in no acute distress reading a book.  Patient handed me long list ( > 30 medications) that he takes for epigastric problems and brain health.  He states he takes these for brain fog.  He also take probiotics and zantac 20mg  BID.  He has a GI physician that he sees regularly.  He states he called his pcp and was expecting an endoscopy done today.  I explained that I did not find a reason to scope him and that he has unremarkable labs.  Patient refused pain medications. His vitals are  stable. Medications  gi cocktail (Maalox,Lidocaine,Donnatal) (30 mLs Oral Given 08/21/14 1602)  dicyclomine (BENTYL) capsule 10 mg (10 mg Oral Given 08/21/14 1753)  Recheck: Patient states he feels no better after GI cocktail. He states his family physician and GI physician both requested imaging of his abdomen. He states he cannot see his GI physician for over a month. I gave him Bentyl. Patient still sitting comfortably reading a book. In no acute distress. Chest x-ray shows no bowel obstruction, no pneumoperitoneum. Ultrasound of the gallbladder shows cholelithiasis without cholecystitis. I offered the patient pain medications and he refused. I have given him Zofran and bentyl. He can follow up with GI and verbally agrees with the plan.      Ottie Glazier, PA-C 08/22/14 [id number]  Ernestina Patches, MD 08/23/14 [phone]

## 2014-08-21 NOTE — Telephone Encounter (Signed)
I spoke with the patient's wife.  He is resting now.  They did not go to the ER as recommended last night.  They thought they were able to choose to be seen in the ER last night or the office this morning.  Patient is still having pain, a little better than it was last night, but not resolved.  I advised her that I do not have any openings to see him today and that he should proceed to the ER as requested by Dr. Hilarie Fredrickson last night.  I explained that if he is having extreme abdominal pain that he needs labs and imaging studies that are best obtained during the acute phase.  I explained that while we can do labs that we don't have the imaging capabilities in the office.  She verbalized understanding and will take him to the ER this am.

## 2014-08-21 NOTE — ED Notes (Signed)
US at bedside

## 2014-08-21 NOTE — ED Notes (Signed)
Ultrasound at BS.

## 2014-08-21 NOTE — ED Notes (Signed)
EDPA HANNAH at bedside. DISCHARGE CANCELLED.

## 2014-08-22 ENCOUNTER — Telehealth: Payer: Self-pay | Admitting: Internal Medicine

## 2014-08-22 NOTE — Telephone Encounter (Signed)
Patient was evaluated in the ER yesterday for abdominal pain and epigastric pain.  [place] was negative, labs essentially normal. See Dr. Hilarie Fredrickson phone note and ER notes.  He was told to follow up in 2 days.  Dr. Carlean Purl no openings until first week in July with [facility].  Please advise the next step.  Ok to wait?

## 2014-08-25 NOTE — Telephone Encounter (Signed)
I left message on his mobile VM  I recommend he see a surgeon about cholelithiasis  Please contact him/wife with this info also  I can speak to him also - as long as not having pain problems can sort out over phone now I think

## 2014-08-25 NOTE — Telephone Encounter (Signed)
I spoke with the patient.  He reports that Dr. Raul Del office is setting up referral to surgeon.  He is asked to call back if he still has persistent symptoms after surgical referral if no plans for surgery.  He verbalized understanding.

## 2014-08-29 ENCOUNTER — Telehealth: Payer: Self-pay | Admitting: Internal Medicine

## 2014-08-29 NOTE — Telephone Encounter (Signed)
I called and spoke with [facility] Dr. Brigitte Pulse has scheduled the patient for 09/11/14 3:00 arrival for 3:30.  I left a message for the patient with the appt info and asked that if he had any additional questions about the appt he should contact Dr. Raul Del office.

## 2014-09-11 ENCOUNTER — Ambulatory Visit: Payer: Self-pay | Admitting: Surgery

## 2014-09-11 NOTE — H&P (Signed)
Aaron Stout 09/11/2014 3:20 PM Location: [facility] Patient #: [id number] DOB: 04/23/1955 Married / Language: English / Race: White Male History of Present Illness Aaron Moores [name] MD; 09/11/2014 4:33 PM) Patient words: gallbladder      Pt sent at the reques of Dr Aaron Stout for epigastric pain for the last 3 months. Seen in ED for severe pain episode and found to have gallstones.  CLINICAL DATA: Right upper quadrant and epigastric pain with nausea. Initial encounter.  EXAM: US ABDOMEN LIMITED - RIGHT UPPER QUADRANT  COMPARISON: Radiograph same date. No comparison studies.  FINDINGS: Gallbladder:  Multiple gallstones are present. There is mild pericholecystic fluid, but no gallbladder wall thickening or sonographic Murphy's sign.  Common bile duct:  Diameter: 1.8 mm. No evidence of choledocholithiasis.  Liver:  Diffusely increased echogenicity consistent with steatosis. There is probable sparing around the gallbladder. Within the left lobe, there is a mildly septated cyst measuring 1.4 cm maximally.  IMPRESSION: 1. Cholelithiasis without sonographic evidence of cholecystitis. 2. Fatty liver with cyst in the left lobe.   Electronically Signed By: Aaron Stout M.D. On: 08/21/2014 18:44.  The patient is a 59 year old male who presents for evaluation of gall stones. The onset of the gall stones has been gradual and has been occurring in an intermittent pattern for 8 hours. The course has been worsening. The gall stones is described as moderate to severe. There has been associated abdominal pain, anorexia, gastrointestinal upset, shoulder pain and vomiting. Precipitating factors include eating. Relieving factors include dietary changes and medication. Other Problems Aaron Stout, Aaron Stout; 09/11/2014 3:20 PM) Anxiety Disorder Asthma Back Pain Cholelithiasis Enlarged Prostate Gastroesophageal Reflux Disease General anesthesia - complications Heart  murmur Hemorrhoids Hypercholesterolemia Migraine Headache Sleep Apnea Ulcerative Colitis  Past Surgical History Aaron Stout, Aaron Stout; 09/11/2014 3:20 PM) Colon Polyp Removal - Colonoscopy Spinal Surgery - Lower Back Tonsillectomy TURP  Diagnostic Studies History Aaron Stout, Aaron Stout; 09/11/2014 3:20 PM) Colonoscopy 1-5 years ago  Allergies Aaron Stout, Aaron Stout; 09/11/2014 3:21 PM) Cymbalta *ANTIDEPRESSANTS* Ibuprofen *ANALGESICS - ANTI-INFLAMMATORY* Penicillin V *PENICILLINS*  Medication History Aaron Stout, Aaron Stout; 09/11/2014 3:28 PM) Doxycycline Hyclate (100MG  Tablet, Oral) Active. Azithromycin (250MG  Tablet, Oral) Active. ValACYclovir HCl (500MG  Tablet, Oral) Active. Adderall XR (30MG  Capsule ER 24HR, Oral) Active. Astelin (137MCG/SPRAY Solution, Nasal) Active. Diflucan (200MG  Tablet, Oral) Active. Flagyl (500MG  Tablet, Oral) Active. Omnicef (300MG  Capsule, Oral) Active. Perphenazine (8MG  Tablet, Oral) Active. Aspirin Low Strength (81MG  Tablet Chewable, Oral) Active. B6 Natural (100MG  Tablet, Oral) Active. Fiber (625MG  Tablet, Oral) Active. Zegerid (20MG  Packet, Oral) Active. Probiotic (Oral) Active. Glucosamine 1500 Complex (Oral) Active. Fish Oil (1000MG  Capsule, Oral two times daily) Active. Melatonin (10MG  Tablet, Oral) Active. Methylfol-Methylcob-Acetylcyst (6-2-600MG  Tablet, Oral) Active. Plaquenil (200MG  Tablet, Oral) Active. Pregnenolone Active. SEROquel (50MG  Tablet, Oral) Active. Medications Reconciled  Social History Aaron Stout, Aaron Stout; 09/11/2014 3:20 PM) Caffeine use Coffee. Illicit drug use Remotely quit drug use. No alcohol use Tobacco use Former smoker.  Family History Aaron Stout, Aaron Stout; 09/11/2014 3:20 PM) Breast Cancer Mother. Depression Brother. Migraine Headache Son.     Review of Systems Aaron Stout Aaron Stout; 09/11/2014 3:20 PM) General Present- Fatigue and Fever. Not Present- Appetite Loss, Chills, Night Sweats, Weight  Gain and Weight Loss. Skin Not Present- Change in Wart/Mole, Dryness, Hives, Jaundice, New Lesions, Non-Healing Wounds, Rash and Ulcer. HEENT Present- Hearing Loss, Ringing in the Ears, Seasonal Allergies, Visual Disturbances and Wears glasses/contact lenses. Not Present- Earache, Hoarseness, Nose Bleed, Oral Ulcers, Sinus Pain, Sore Throat and Yellow Eyes. Respiratory  Present- Snoring and Wheezing. Not Present- Bloody sputum, Chronic Cough and Difficulty Breathing. Cardiovascular Present- Palpitations. Not Present- Chest Pain, Difficulty Breathing Lying Down, Leg Cramps, Rapid Heart Rate, Shortness of Breath and Swelling of Extremities. Gastrointestinal Present- Abdominal Pain, Bloating, Constipation, Excessive gas, Hemorrhoids and Indigestion. Not Present- Bloody Stool, Change in Bowel Habits, Chronic diarrhea, Difficulty Swallowing, Gets full quickly at meals, Nausea, Rectal Pain and Vomiting. Male Genitourinary Not Present- Blood in Urine, Change in Urinary Stream, Frequency, Impotence, Nocturia, Painful Urination, Urgency and Urine Leakage. Musculoskeletal Present- Back Pain, Joint Stiffness and Muscle Pain. Not Present- Joint Pain, Muscle Weakness and Swelling of Extremities. Neurological Present- Fainting and Headaches. Not Present- Decreased Memory, Numbness, Seizures, Tingling, Tremor, Trouble walking and Weakness. Psychiatric Present- Anxiety. Not Present- Bipolar, Change in Sleep Pattern, Depression, Fearful and Frequent crying. Endocrine Not Present- Cold Intolerance, Excessive Hunger, Hair Changes, Heat Intolerance, Hot flashes and New Diabetes. Hematology Not Present- Easy Bruising, Excessive bleeding, Gland problems, HIV and Persistent Infections.  Vitals Aaron Stout Aaron Stout; 09/11/2014 3:28 PM) 09/11/2014 3:28 PM Weight: 225 lb Height: 73in Body Surface Area: 2.29 m Body Mass Index: 29.68 kg/m Temp.: 98.33F(Oral)  Stout: 86 (Regular)  BP: 136/70 (Sitting, Left Arm,  Standard)     Physical Exam (Aaron Llerena A. Theordore Cisnero MD; 09/11/2014 4:33 PM)  General Mental Status-Alert. General Appearance-Consistent with stated age. Hydration-Well hydrated. Voice-Normal.  Head and Neck Head-normocephalic, atraumatic with no lesions or palpable masses.  Eye Eyeball - Bilateral-Extraocular movements intact. Sclera/Conjunctiva - Bilateral-No scleral icterus.  Chest and Lung Exam Chest and lung exam reveals -quiet, even and easy respiratory effort with no use of accessory muscles and on auscultation, normal breath sounds, no adventitious sounds and normal vocal resonance. Inspection Chest Wall - Normal. Back - normal.  Cardiovascular Cardiovascular examination reveals -on palpation PMI is normal in location and amplitude, no palpable S3 or S4. Normal cardiac borders., normal heart sounds, regular rate and rhythm with no murmurs, carotid auscultation reveals no bruits and normal pedal pulses bilaterally.  Abdomen Inspection Inspection of the abdomen reveals - No Hernias. Skin - Scar - no surgical scars. Palpation/Percussion Palpation and Percussion of the abdomen reveal - Soft, Non Tender, No Rebound tenderness, No Rigidity (guarding) and No hepatosplenomegaly. Auscultation Auscultation of the abdomen reveals - Bowel sounds normal.  Neurologic Neurologic evaluation reveals -alert and oriented x 3 with no impairment of recent or remote memory. Mental Status-Normal.  Musculoskeletal Normal Exam - Left-Upper Extremity Strength Normal and Lower Extremity Strength Normal. Normal Exam - Right-Upper Extremity Strength Normal, Lower Extremity Weakness.    Assessment & Plan (Jove Beyl A. Lovena Kluck MD; 09/11/2014 4:33 PM)  SYMPTOMATIC CHOLELITHIASIS (574.20  K80.20) Impression: recommend laparscopic cholecystectomy and cholangiogram for symptomatic cholelithiasis  The procedure has been discussed with the patient. Risks of laparoscopic  cholecystectomy include bleeding, infection, bile duct injury, leak, death, open surgery, diarrhea, other surgery, organ injury, blood vessel injury, DVT, and additional care.  Current Plans Pt Education - [facility] Laparoscopic Surgery [facility] Pt Education - [facility] Laparosopic Post Op [facility] ([place])

## 2014-09-26 ENCOUNTER — Encounter (HOSPITAL_COMMUNITY)
Admission: RE | Admit: 2014-09-26 | Discharge: 2014-09-26 | Disposition: A | Discharge status: Home / Self Care | Payer: BLUE CROSS/BLUE SHIELD | Source: Ambulatory Visit | Attending: Surgery | Admitting: Surgery

## 2014-09-26 ENCOUNTER — Encounter (HOSPITAL_COMMUNITY): Payer: Self-pay

## 2014-09-26 DIAGNOSIS — Z01818 Encounter for other preprocedural examination: Secondary | ICD-10-CM | POA: Insufficient documentation

## 2014-09-26 DIAGNOSIS — J45909 Unspecified asthma, uncomplicated: Secondary | ICD-10-CM | POA: Diagnosis not present

## 2014-09-26 DIAGNOSIS — Z79899 Other long term (current) drug therapy: Secondary | ICD-10-CM | POA: Insufficient documentation

## 2014-09-26 DIAGNOSIS — I2510 Atherosclerotic heart disease of native coronary artery without angina pectoris: Secondary | ICD-10-CM | POA: Insufficient documentation

## 2014-09-26 DIAGNOSIS — Z01812 Encounter for preprocedural laboratory examination: Secondary | ICD-10-CM | POA: Insufficient documentation

## 2014-09-26 DIAGNOSIS — Z87891 Personal history of nicotine dependence: Secondary | ICD-10-CM | POA: Insufficient documentation

## 2014-09-26 DIAGNOSIS — K219 Gastro-esophageal reflux disease without esophagitis: Secondary | ICD-10-CM | POA: Insufficient documentation

## 2014-09-26 DIAGNOSIS — K8020 Calculus of gallbladder without cholecystitis without obstruction: Secondary | ICD-10-CM | POA: Insufficient documentation

## 2014-09-26 DIAGNOSIS — I4510 Unspecified right bundle-branch block: Secondary | ICD-10-CM | POA: Diagnosis not present

## 2014-09-26 DIAGNOSIS — Z7982 Long term (current) use of aspirin: Secondary | ICD-10-CM | POA: Diagnosis not present

## 2014-09-26 DIAGNOSIS — E780 Pure hypercholesterolemia: Secondary | ICD-10-CM | POA: Insufficient documentation

## 2014-09-26 LAB — CBC WITH DIFFERENTIAL/PLATELET
BASOS ABS: 0 10*3/uL (ref 0.0–0.1)
Basophils Relative: 1 % (ref 0–1)
Eosinophils Absolute: 0.5 10*3/uL (ref 0.0–0.7)
Eosinophils Relative: 7 % — ABNORMAL HIGH (ref 0–5)
HCT: 44 % (ref 39.0–52.0)
HEMOGLOBIN: 15.3 g/dL (ref 13.0–17.0)
Lymphocytes Relative: 32 % (ref 12–46)
Lymphs Abs: 2.1 10*3/uL (ref 0.7–4.0)
MCH: 31.8 pg (ref 26.0–34.0)
MCHC: 34.8 g/dL (ref 30.0–36.0)
MCV: 91.5 fL (ref 78.0–100.0)
Monocytes Absolute: 0.6 10*3/uL (ref 0.1–1.0)
Monocytes Relative: 9 % (ref 3–12)
NEUTROS ABS: 3.5 10*3/uL (ref 1.7–7.7)
NEUTROS PCT: 51 % (ref 43–77)
Platelets: 176 10*3/uL (ref 150–400)
RBC: 4.81 MIL/uL (ref 4.22–5.81)
RDW: 13.3 % (ref 11.5–15.5)
WBC: 6.7 10*3/uL (ref 4.0–10.5)

## 2014-09-26 LAB — COMPREHENSIVE METABOLIC PANEL
ALBUMIN: 3.8 g/dL (ref 3.5–5.0)
ALK PHOS: 36 U/L — AB (ref 38–126)
ALT: 28 U/L (ref 17–63)
ANION GAP: 10 (ref 5–15)
AST: 26 U/L (ref 15–41)
BILIRUBIN TOTAL: 0.6 mg/dL (ref 0.3–1.2)
BUN: 13 mg/dL (ref 6–20)
CHLORIDE: 104 mmol/L (ref 101–111)
CO2: 24 mmol/L (ref 22–32)
CREATININE: 1.29 mg/dL — AB (ref 0.61–1.24)
Calcium: 9.6 mg/dL (ref 8.9–10.3)
GFR, EST NON AFRICAN AMERICAN: 59 mL/min — AB (ref >60)
Glucose, Bld: 135 mg/dL — ABNORMAL HIGH (ref 65–99)
Potassium: 3.8 mmol/L (ref 3.5–5.1)
SODIUM: 138 mmol/L (ref 135–145)
Total Protein: 6.9 g/dL (ref 6.5–8.1)

## 2014-09-26 NOTE — Progress Notes (Signed)
Pt. Has not seen cardiologist since 2014,@ that time Dr. Julious Payer note indicated that he would get ECHO with next visit but pt. Has not been back to see him. Pt. States that he is not having any symptoms and did not see the need to return.

## 2014-09-26 NOTE — Pre-Procedure Instructions (Signed)
    Aaron Stout  09/26/2014      [id number] [address], [place] [address] Phone: [phone] Fax: [phone]    Your procedure is scheduled on 10-08-2014    Wednesday    Report to [facility] Admitting at 11:00 A.M.   Call this number if you have problems the morning of surgery:  [phone]   Remember:  Do not eat food or drink liquids after midnight.   Take these medicines the morning of surgery with A SIP OF WATER Adderall  XR, astelin nasal spray,dicyclomine(Bentyl),Flonase nasal spray,Zegerid,zofran              Discontinue aspirin,ibuprofen,aleve,naproxen,Goodt's powders,herbal supplements and medications 7 days prior to surgery   Do not wear jewelry,   Do not wear lotions, powders, or perfumes.    Do not shave 48 hours prior to surgery.  Men may shave face and neck.   Do not bring valuables to the hospital.  [facility] is not responsible for any belongings or valuables.  Contacts, dentures or bridgework may not be worn into surgery.  Leave your suitcase in the car.  After surgery it may be brought to your room.  For patients admitted to the hospital, discharge time will be determined by your treatment team.  Patients discharged the day of surgery will not be allowed to drive home.    Special instructions:  See attached sheet "Preparing for Surgery" for instructions on CHG shower  Please read over the following fact sheets that you were given. Pain Booklet, Coughing and Deep Breathing and Surgical Site Infection Prevention

## 2014-09-30 NOTE — Progress Notes (Addendum)
Anesthesia Chart Review:  Pt is 59 year old male scheduled for laparoscopic cholecystectomy with intraoperative cholangiogram on 10/08/2014 with Dr. Brantley Stage.    Cardiologist is Dr. Acie Fredrickson, last office visit 07/16/2012. At that time, f/u was recommended in 1 year, with plan to check an echo prior to 1 year f/u. This did not occur. Pt reports to PAT RN he is not having symptoms and did not see the need to return for f/u.   PMH includes: CAD, syncope (related to hypotension),  Hypercholesterolemia, asthma, GERD, bipolar disorder. Remote hx OSA resolved after uvulopalatophyarngoplasty (1990). Former smoker. BMI 29. S/p green laser of prostate 06/01/12.   Medications include: ASA, adderall, zithromax, cefdinir, doxycycline, plaquenil, perphenazine, seroquel.   Preoperative labs reviewed.   Nuclear stress test 04/11/2012: -Intermediate stress nuclear study due to reduced LV function; no ischemia or infarction; suggest echocardiogram to better assess LV function. LV Ejection Fraction: 46%. LV Wall Motion: Global hypokinesis.  Echo 09/09/2009: -Normal LV systolic function -trace MR, trace TR -trivial pulmonic insufficiency  Cardiac cath 05/14/2008: 1. Minor coronary artery irregularities. 2. Normal left ventricular systolic function with a very mild aortic valve gradient of 7 mm (mean gradient). We will continue with medical therapy.  Discussed case with Dr. Conrad Manter. Pt will need cardiac clearance prior to surgery. Notified Raquel Sarna, triage RN, in Dr. Josetta Huddle office.   Willeen Cass, FNP-BC [facility] Short Stay Surgical Center/Anesthesiology Phone: [phone] 09/30/2014 4:45 PM  Addendum: I spoke Michelle at [place]. Patient is seeing cardiologist Dr. Acie Fredrickson tomorrow at [facility]. If he is cleared then plans to proceed with surgery as scheduled tomorrow afternoon. If not cleared then will postpone surgery.  Patient to contact [facility] following his appointment tomorrow.  Cardiology notes will hopefully be in Epic.  (Update: Dr. Acie Fredrickson cleared patient for surgery with low risk following his 10/08/14 evaluation.)  George Hugh [facility] Short Stay Center/Anesthesiology Phone [phone] 10/07/2014 1:48 PM

## 2014-10-01 ENCOUNTER — Telehealth: Payer: Self-pay | Admitting: Nurse Practitioner

## 2014-10-01 NOTE — Telephone Encounter (Signed)
Received message through another employee that per [facility], patient needs cardiac clearance for surgery scheduled for next week.  I left a message for patient to call me.

## 2014-10-01 NOTE — Telephone Encounter (Signed)
Spoke with patient who states he was advised he needs cardiac clearance prior to surgery Wednesday 8/3.  He is scheduled to report to the hospital at 11:00 on that day.  I advised him Wednesday is the first day Dr. Acie Fredrickson will be back in the office and scheduled him to come in at 8:00 for clearance.  He verbalized understanding and agreement and thanked me for the call.

## 2014-10-08 ENCOUNTER — Encounter (HOSPITAL_COMMUNITY)
Admission: RE | Disposition: A | Discharge status: Home / Self Care | Payer: Self-pay | Source: Ambulatory Visit | Attending: Surgery

## 2014-10-08 ENCOUNTER — Ambulatory Visit (HOSPITAL_COMMUNITY): Payer: BLUE CROSS/BLUE SHIELD | Admitting: Anesthesiology

## 2014-10-08 ENCOUNTER — Ambulatory Visit (HOSPITAL_COMMUNITY): Payer: BLUE CROSS/BLUE SHIELD | Admitting: Emergency Medicine

## 2014-10-08 ENCOUNTER — Ambulatory Visit (HOSPITAL_COMMUNITY)
Admission: RE | Admit: 2014-10-08 | Discharge: 2014-10-08 | Disposition: A | Discharge status: Home / Self Care | Payer: BLUE CROSS/BLUE SHIELD | Source: Ambulatory Visit | Attending: Surgery | Admitting: Surgery

## 2014-10-08 ENCOUNTER — Ambulatory Visit (INDEPENDENT_AMBULATORY_CARE_PROVIDER_SITE_OTHER): Payer: BLUE CROSS/BLUE SHIELD | Admitting: Cardiovascular Disease

## 2014-10-08 ENCOUNTER — Encounter: Payer: Self-pay | Admitting: Cardiovascular Disease

## 2014-10-08 ENCOUNTER — Ambulatory Visit (HOSPITAL_COMMUNITY): Payer: BLUE CROSS/BLUE SHIELD

## 2014-10-08 ENCOUNTER — Encounter (HOSPITAL_COMMUNITY): Payer: Self-pay | Admitting: *Deleted

## 2014-10-08 VITALS — BP 120/82 | HR 67 | Ht 74.0 in | Wt 221.8 lb

## 2014-10-08 DIAGNOSIS — I2510 Atherosclerotic heart disease of native coronary artery without angina pectoris: Secondary | ICD-10-CM

## 2014-10-08 DIAGNOSIS — I350 Nonrheumatic aortic (valve) stenosis: Secondary | ICD-10-CM

## 2014-10-08 DIAGNOSIS — K8010 Calculus of gallbladder with chronic cholecystitis without obstruction: Secondary | ICD-10-CM | POA: Diagnosis present

## 2014-10-08 DIAGNOSIS — E785 Hyperlipidemia, unspecified: Secondary | ICD-10-CM | POA: Diagnosis not present

## 2014-10-08 DIAGNOSIS — E7800 Pure hypercholesterolemia, unspecified: Secondary | ICD-10-CM

## 2014-10-08 DIAGNOSIS — Z419 Encounter for procedure for purposes other than remedying health state, unspecified: Secondary | ICD-10-CM

## 2014-10-08 HISTORY — DX: Atherosclerotic heart disease of native coronary artery without angina pectoris: I25.10

## 2014-10-08 HISTORY — PX: CHOLECYSTECTOMY: SHX55

## 2014-10-08 SURGERY — LAPAROSCOPIC CHOLECYSTECTOMY WITH INTRAOPERATIVE CHOLANGIOGRAM
Anesthesia: General | Site: Abdomen

## 2014-10-08 MED ORDER — MIDAZOLAM HCL 2 MG/2ML IJ SOLN
INTRAMUSCULAR | Status: AC
  Filled 2014-10-08: qty 4

## 2014-10-08 MED ORDER — PROPOFOL 10 MG/ML IV BOLUS
INTRAVENOUS | Status: AC
  Filled 2014-10-08: qty 20

## 2014-10-08 MED ORDER — BUPIVACAINE-EPINEPHRINE (PF) 0.25% -1:200000 IJ SOLN
INTRAMUSCULAR | Status: AC
  Filled 2014-10-08: qty 30

## 2014-10-08 MED ORDER — LIDOCAINE HCL (CARDIAC) 20 MG/ML IV SOLN
INTRAVENOUS | Status: DC | PRN
  Administered 2014-10-08: 80 mg via INTRAVENOUS

## 2014-10-08 MED ORDER — HEMOSTATIC AGENTS (NO CHARGE) OPTIME
TOPICAL | Status: DC | PRN
  Administered 2014-10-08: 2 via TOPICAL

## 2014-10-08 MED ORDER — CIPROFLOXACIN IN D5W 400 MG/200ML IV SOLN: 400.0000 mg | INTRAVENOUS | Status: DC

## 2014-10-08 MED ORDER — HYDROMORPHONE HCL 1 MG/ML IJ SOLN
INTRAMUSCULAR | Status: AC
  Administered 2014-10-08: 0.25 mg via INTRAVENOUS
  Filled 2014-10-08: qty 1

## 2014-10-08 MED ORDER — MIDAZOLAM HCL 5 MG/5ML IJ SOLN
INTRAMUSCULAR | Status: DC | PRN
  Administered 2014-10-08: 2 mg via INTRAVENOUS

## 2014-10-08 MED ORDER — 0.9 % SODIUM CHLORIDE (POUR BTL) OPTIME
TOPICAL | Status: DC | PRN
  Administered 2014-10-08: 1000 mL

## 2014-10-08 MED ORDER — ONDANSETRON HCL 4 MG/2ML IJ SOLN
INTRAMUSCULAR | Status: AC
  Administered 2014-10-08: 4 mg via INTRAVENOUS
  Filled 2014-10-08: qty 2

## 2014-10-08 MED ORDER — FENTANYL CITRATE (PF) 100 MCG/2ML IJ SOLN
INTRAMUSCULAR | Status: DC | PRN
  Administered 2014-10-08: 100 ug via INTRAVENOUS
  Administered 2014-10-08: 50 ug via INTRAVENOUS
  Administered 2014-10-08: 100 ug via INTRAVENOUS
  Administered 2014-10-08 (×2): 50 ug via INTRAVENOUS

## 2014-10-08 MED ORDER — LACTATED RINGERS IV SOLN
INTRAVENOUS | Status: DC
  Administered 2014-10-08: 11:00:00 via INTRAVENOUS

## 2014-10-08 MED ORDER — DEXAMETHASONE SODIUM PHOSPHATE 10 MG/ML IJ SOLN
8.0000 mg | Freq: Once | INTRAMUSCULAR | Status: AC
  Administered 2014-10-08: 8 mg via INTRAVENOUS

## 2014-10-08 MED ORDER — CIPROFLOXACIN IN D5W 400 MG/200ML IV SOLN
INTRAVENOUS | Status: AC
  Administered 2014-10-08: 400 mg via INTRAVENOUS
  Filled 2014-10-08: qty 200

## 2014-10-08 MED ORDER — PROMETHAZINE HCL 25 MG/ML IJ SOLN
6.2500 mg | INTRAMUSCULAR | Status: AC | PRN
  Administered 2014-10-08 (×2): 6.25 mg via INTRAVENOUS

## 2014-10-08 MED ORDER — PROPOFOL 10 MG/ML IV BOLUS
INTRAVENOUS | Status: DC | PRN
  Administered 2014-10-08: 200 mg via INTRAVENOUS

## 2014-10-08 MED ORDER — FENTANYL CITRATE (PF) 250 MCG/5ML IJ SOLN
INTRAMUSCULAR | Status: AC
  Filled 2014-10-08: qty 5

## 2014-10-08 MED ORDER — BUPIVACAINE-EPINEPHRINE 0.25% -1:200000 IJ SOLN
INTRAMUSCULAR | Status: DC | PRN
  Administered 2014-10-08: 9 mL

## 2014-10-08 MED ORDER — CHLORHEXIDINE GLUCONATE 4 % EX LIQD: 1.0000 "application " | Freq: Once | CUTANEOUS | Status: DC

## 2014-10-08 MED ORDER — LACTATED RINGERS IV SOLN
INTRAVENOUS | Status: DC | PRN
  Administered 2014-10-08 (×2): via INTRAVENOUS

## 2014-10-08 MED ORDER — LACTATED RINGERS IV SOLN: INTRAVENOUS | Status: DC

## 2014-10-08 MED ORDER — DEXAMETHASONE SODIUM PHOSPHATE 4 MG/ML IJ SOLN
INTRAMUSCULAR | Status: AC
  Filled 2014-10-08: qty 2

## 2014-10-08 MED ORDER — HYDROMORPHONE HCL 1 MG/ML IJ SOLN
0.2500 mg | INTRAMUSCULAR | Status: DC | PRN
  Administered 2014-10-08 (×2): 0.5 mg via INTRAVENOUS
  Administered 2014-10-08 (×2): 0.25 mg via INTRAVENOUS

## 2014-10-08 MED ORDER — PROMETHAZINE HCL 25 MG/ML IJ SOLN
INTRAMUSCULAR | Status: AC
  Filled 2014-10-08: qty 1

## 2014-10-08 MED ORDER — DEXAMETHASONE SODIUM PHOSPHATE 10 MG/ML IJ SOLN
INTRAMUSCULAR | Status: AC
  Administered 2014-10-08: 8 mg via INTRAVENOUS
  Filled 2014-10-08: qty 1

## 2014-10-08 MED ORDER — ONDANSETRON HCL 4 MG/2ML IJ SOLN
4.0000 mg | Freq: Once | INTRAMUSCULAR | Status: AC
  Administered 2014-10-08: 4 mg via INTRAVENOUS

## 2014-10-08 MED ORDER — DEXAMETHASONE SODIUM PHOSPHATE 4 MG/ML IJ SOLN
INTRAMUSCULAR | Status: DC | PRN
  Administered 2014-10-08: 8 mg via INTRAVENOUS

## 2014-10-08 MED ORDER — MEPERIDINE HCL 25 MG/ML IJ SOLN: 6.2500 mg | INTRAMUSCULAR | Status: DC | PRN

## 2014-10-08 MED ORDER — ONDANSETRON HCL 4 MG PO TABS: 4.0000 mg | ORAL_TABLET | Freq: Three times a day (TID) | ORAL | Status: DC | PRN

## 2014-10-08 MED ORDER — OXYCODONE-ACETAMINOPHEN 5-325 MG PO TABS: 1.0000 | ORAL_TABLET | ORAL | Status: DC | PRN

## 2014-10-08 MED ORDER — NEOSTIGMINE METHYLSULFATE 10 MG/10ML IV SOLN
INTRAVENOUS | Status: DC | PRN
  Administered 2014-10-08: 5 mg via INTRAVENOUS

## 2014-10-08 MED ORDER — ROCURONIUM BROMIDE 100 MG/10ML IV SOLN
INTRAVENOUS | Status: DC | PRN
  Administered 2014-10-08: 50 mg via INTRAVENOUS

## 2014-10-08 MED ORDER — GLYCOPYRROLATE 0.2 MG/ML IJ SOLN
INTRAMUSCULAR | Status: DC | PRN
  Administered 2014-10-08: 0.6 mg via INTRAVENOUS

## 2014-10-08 MED ORDER — ONDANSETRON HCL 4 MG/2ML IJ SOLN
INTRAMUSCULAR | Status: DC | PRN
  Administered 2014-10-08: 4 mg via INTRAVENOUS

## 2014-10-08 MED ORDER — SODIUM CHLORIDE 0.9 % IR SOLN
Status: DC | PRN
  Administered 2014-10-08: 1000 mL

## 2014-10-08 MED ORDER — SODIUM CHLORIDE 0.9 % IV SOLN
INTRAVENOUS | Status: DC | PRN
  Administered 2014-10-08: 12 mL

## 2014-10-08 SURGICAL SUPPLY — 46 items
APPLIER CLIP ROT 10 11.4 M/L (STAPLE) ×2
APR CLP MED LRG 11.4X10 (STAPLE) ×1
BAG SPEC RTRVL LRG 6X4 10 (ENDOMECHANICALS) ×1
BLADE SURG ROTATE 9660 (MISCELLANEOUS) IMPLANT
CANISTER SUCTION 2500CC (MISCELLANEOUS) ×2 IMPLANT
CHLORAPREP W/TINT 26ML (MISCELLANEOUS) ×2 IMPLANT
CLIP APPLIE ROT 10 11.4 M/L (STAPLE) ×1 IMPLANT
COVER MAYO STAND STRL (DRAPES) ×2 IMPLANT
COVER SURGICAL LIGHT HANDLE (MISCELLANEOUS) ×2 IMPLANT
DRAPE C-ARM 42X72 X-RAY (DRAPES) ×2 IMPLANT
DRAPE WARM FLUID 44X44 (DRAPE) ×2 IMPLANT
ELECT REM PT RETURN 9FT ADLT (ELECTROSURGICAL) ×2
ELECTRODE REM PT RTRN 9FT ADLT (ELECTROSURGICAL) ×1 IMPLANT
GLOVE BIO SURGEON STRL SZ7 (GLOVE) ×1 IMPLANT
GLOVE BIO SURGEON STRL SZ7.5 (GLOVE) ×1 IMPLANT
GLOVE BIO SURGEON STRL SZ8 (GLOVE) ×2 IMPLANT
GLOVE BIOGEL PI IND STRL 7.0 (GLOVE) IMPLANT
GLOVE BIOGEL PI IND STRL 7.5 (GLOVE) IMPLANT
GLOVE BIOGEL PI IND STRL 8 (GLOVE) ×1 IMPLANT
GLOVE BIOGEL PI INDICATOR 7.0 (GLOVE) ×1
GLOVE BIOGEL PI INDICATOR 7.5 (GLOVE) ×2
GLOVE BIOGEL PI INDICATOR 8 (GLOVE) ×1
GOWN STRL REUS W/ TWL LRG LVL3 (GOWN DISPOSABLE) ×2 IMPLANT
GOWN STRL REUS W/ TWL XL LVL3 (GOWN DISPOSABLE) ×1 IMPLANT
GOWN STRL REUS W/TWL LRG LVL3 (GOWN DISPOSABLE) ×4
GOWN STRL REUS W/TWL XL LVL3 (GOWN DISPOSABLE) ×2
HEMOSTAT SNOW SURGICEL 2X4 (HEMOSTASIS) ×2 IMPLANT
KIT BASIN OR (CUSTOM PROCEDURE TRAY) ×2 IMPLANT
KIT ROOM TURNOVER OR (KITS) ×2 IMPLANT
LIQUID BAND (GAUZE/BANDAGES/DRESSINGS) ×2 IMPLANT
NS IRRIG 1000ML POUR BTL (IV SOLUTION) ×2 IMPLANT
PAD ARMBOARD 7.5X6 YLW CONV (MISCELLANEOUS) ×2 IMPLANT
POUCH SPECIMEN RETRIEVAL 10MM (ENDOMECHANICALS) ×2 IMPLANT
SCISSORS LAP 5X35 DISP (ENDOMECHANICALS) ×2 IMPLANT
SET CHOLANGIOGRAPH 5 50 .035 (SET/KITS/TRAYS/PACK) ×2 IMPLANT
SET IRRIG TUBING LAPAROSCOPIC (IRRIGATION / IRRIGATOR) ×2 IMPLANT
SLEEVE ENDOPATH XCEL 5M (ENDOMECHANICALS) ×2 IMPLANT
SPECIMEN JAR SMALL (MISCELLANEOUS) ×2 IMPLANT
SUT MNCRL AB 4-0 PS2 18 (SUTURE) ×2 IMPLANT
TOWEL OR 17X24 6PK STRL BLUE (TOWEL DISPOSABLE) ×2 IMPLANT
TOWEL OR 17X26 10 PK STRL BLUE (TOWEL DISPOSABLE) ×2 IMPLANT
TRAY LAPAROSCOPIC MC (CUSTOM PROCEDURE TRAY) ×2 IMPLANT
TROCAR XCEL BLUNT TIP 100MML (ENDOMECHANICALS) ×2 IMPLANT
TROCAR XCEL NON-BLD 11X100MML (ENDOMECHANICALS) ×2 IMPLANT
TROCAR XCEL NON-BLD 5MMX100MML (ENDOMECHANICALS) ×2 IMPLANT
TUBING INSUFFLATION (TUBING) ×2 IMPLANT

## 2014-10-08 NOTE — Progress Notes (Signed)
[name] Date of Birth  22-Jun-1955       [facility]    [employer] [address], [address], [place], [place]  [address]   [place], [place]  [address] [phone]     [phone]   Fax  [phone]    Fax [phone]  Problem List: 1. Syncope 2. Mild to moderate coronary artery disease by heart catheterization ( 2010) 3. Bipolar disease 4. Sleep apnea 5. Hyperlipidemia-    History of Present Illness:  Aaron Stout is doing well.  He denies any episodes of chest pain or shortness breath.  He's able to do his normal activities without any significant problems but he is not exercising on a regular basis.  He stop his atorvastatin several weeks ago because it seemed to exacerbate his trigger finger condition.  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  April 05, 2012: Aaron Stout presents today with some vague chest aches, fatigue, palpitations.   These episodes are not related to exertion,  Lasts for 2-3 seconds.  He has a hx of hiatal hernia.  He also has some palpitations  Jul 16, 2012:   Aaron Stout presents for further evaluation of this chest pain.   He had a Myoview study which was read it read out as intermediate risk.  He has a EF of 46%.  He had no evidence of ischemia. He had mildly depressed left ventricle systolic function. This stress test is was unchanged from his previous stress test many years ago. He has had an unremarkable cardiac catheterization in the past.  Aug. 3, 2016:  Doing well. No CP or dyspnea.  No syncope Walking 3 times a week. Scheduled for gall bladder surgery today .  Was scheduled here for preop clearance.     Current Outpatient Prescriptions on File Prior to Visit  Medication Sig Dispense Refill  . amphetamine-dextroamphetamine (ADDERALL XR) 30 MG 24 hr capsule Take 60 mg by mouth every morning.     Marland Kitchen aspirin 81 MG tablet Take 81 mg by mouth  daily.    Marland Kitchen azelastine (ASTELIN) 137 MCG/SPRAY nasal spray 1 spray by Nasal route 2 (two) times daily. Use in each nostril as directed     . azithromycin (ZITHROMAX) 250 MG tablet Take 250 mg by mouth. Take 1 tablet by mouth twice daily on Mon Wed Fri for 3 weeks, then rest for 1 week and continue    . cefdinir (OMNICEF) 300 MG capsule Take 300 mg by mouth. Take 1 tablet by mouth bid on Mon thru Fri for 3 weeks, rest then continue, for suspected bacterial infection.    . Cholecalciferol (VITAMIN D) 2000 UNITS tablet Take 2,000 Units by mouth daily.    . Coenzyme Q10 (CO Q-10) 100 MG CAPS Take 100 mg by mouth daily.    Marland Kitchen CREAM BASE EX Apply 1 application topically as directed. Apply 2.5 mg of Testosterone Cream on Mon., Wed., and Fri.    Marland Kitchen DHEA 25 MG CAPS Take 1 capsule by mouth daily.    Marland Kitchen docusate sodium (COLACE) 100 MG capsule Take 100 mg by mouth 2 (two) times daily.    Marland Kitchen doxycycline (DORYX) 100 MG EC tablet Take 100 mg by mouth 3 (three) times a week. Take on Mon, Wed and Fri for 3 weeks. Rest 1 week then continue course.    . fexofenadine (ALLEGRA) 180 MG tablet Take 180  mg by mouth every evening.     . fluconazole (DIFLUCAN) 200 MG tablet Take 200 mg by mouth 2 (two) times a week. On Saturday and Sunday for gut health.    . fluticasone (FLONASE) 50 MCG/ACT nasal spray 2 sprays by Nasal route 2 (two) times daily.      . Glucosamine-Chondroit-Vit C-Mn (GLUCOSAMINE 1500 COMPLEX PO) Take 1 tablet by mouth daily.     . hydroxychloroquine (PLAQUENIL) 200 MG tablet Take 200 mg by mouth. Take 1 tablet by mouth daily Mon thru Fri for 3 weeks, rest then continue course, for suspected bacteria.    Marland Kitchen l-methylfolate-B6-B12 (METANX) 3-35-2 MG TABS Take 1 tablet by mouth daily.    . Melatonin 5 MG TBDP Take 10 mg by mouth at bedtime.    . metroNIDAZOLE (FLAGYL) 500 MG tablet Take 500 mg by mouth 2 (two) times a week. Take on Sat and Sun for gut health.    . Omega-3 Fatty Acids (FISH OIL PO) Take 2,000 mg by  mouth 2 (two) times daily.    Earney Navy Bicarbonate (ZEGERID) 20-1100 MG CAPS capsule Take 1 capsule by mouth 2 (two) times daily.     Marland Kitchen OVER THE COUNTER MEDICATION Take 100 mg by mouth daily. Pregnenolone supplement    . OVER THE COUNTER MEDICATION Take 400 mg by mouth daily. Sam-e l-methionine LBCore protocol George Hugh, [place]    . perphenazine (TRILAFON) 2 MG tablet Take 1 tablet by mouth in the AM and take 2 tablets by mouth in the PM    . Probiotic Product (PROBIOTIC DAILY PO) Take 1 tablet by mouth daily as needed (gut health).     . Psyllium (FIBER) 0.52 G CAPS Take 6 capsules by mouth every evening.     . pyridOXINE (VITAMIN B-6) 100 MG tablet Take 100 mg by mouth daily.    . valACYclovir (VALTREX) 500 MG tablet Take 500 mg by mouth every 8 (eight) hours.     Marland Kitchen MAGNESIUM GLYCINATE PLUS PO Take 2 tablets by mouth at bedtime.     No current facility-administered medications on file prior to visit.    Allergies  Allergen Reactions  . Cymbalta [Duloxetine Hcl] Other (See Comments)    drowsiness  . Ibuprofen Hives  . Penicillins Other (See Comments)    REACTION: unknown    Past Medical History  Diagnosis Date  . Syncope     last episode 1 yr ago-evaluated by cardiologist-negative findings-?related to low B/p (hydration tends to help).  . Coronary artery disease   . Bipolar 1 disorder   . Hypercholesterolemia   . Depression   . Allergic rhinitis   . Prostatitis   . Asthma   . IBS (irritable bowel syndrome)   . Internal and external hemorrhoids without complication   . Adenomatous colon polyp 2001  . GERD (gastroesophageal reflux disease)   . Radiculopathy of cervical spine   . History of degenerative disc disease   . Chronic fatigue syndrome   . Diverticulosis   . Anxiety   . Chronic headaches     d/t old neck injury  . Fibromyalgia   . Stiff neck   . Cancer     hx skin cancer  . HOH (hard of hearing)     slight-bilateral  . Complication of  anesthesia     x1 episode "panic attack" awakening-none in recent years  . Dysrhythmia     was told benign by cardiologist  . Sleep apnea  better s/p UPP-no problems now  . Arthritis     arthritis-hands    Past Surgical History  Procedure Laterality Date  . Uvulopalatopharyngoplasty  1990  . Nasal polyp excision    . 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 Back surgery  1975, 1985  . Colonoscopy w/ biopsies  multiple  . Esophagogastroduodenoscopy  multiple  . Cardiac catheterization  05/2008    negative  . Green light laser turp (transurethral resection of prostate N/A 06/01/2012    Procedure: GREEN LIGHT LASER OF PROSTATE ;  Surgeon: Ailene Rud, MD;  Location: [facility];  Service: Urology;  Laterality: N/A;    History  Smoking status  . Former Smoker -- 0.50 packs/day for 7 years  . Types: Cigarettes  . Quit date: 07/06/1980  Smokeless tobacco  . Never Used    History  Alcohol Use No    Family History  Problem Relation Age of Onset  . Breast cancer Mother 104  . Heart failure Father 50  . Heart disease      grandfather/uncle  . Alcoholism      greatgrandfather/uncle    . Colon polyps      uncle    Reviw of Systems:  Reviewed in the HPI.  All other systems are negative.  Physical Exam: Blood pressure 120/82, pulse 67, height 6\' 2"  (1.88 m), weight 100.608 kg (221 lb 12.8 oz). General: Well developed, well nourished, in no acute distress.  Head: Normocephalic, atraumatic, sclera non-icteric, mucus membranes are moist,   Neck: Supple. Carotids are 2 + without bruits. No JVD  Lungs: Clear bilaterally to auscultation.  Heart: regular rate.  normal  S1 S2. No murmurs, gallops or rubs.  Abdomen: Soft, non-tender, non-distended with normal bowel sounds. No hepatomegaly. No rebound/guarding. No masses.  Msk:  Strength and tone are normal  Extremities: No clubbing or cyanosis. No edema.  Distal pedal pulses are 2+ and equal  bilaterally.  Neuro: Alert and oriented X 3. Moves all extremities spontaneously.  Psych:  Responds to questions appropriately with a normal affect.  ECG: Aug. 3, 2016 NSR at 67.  RBBB. No changes from previous tracing   Assessment / Plan:   1. Syncope  - no recent episodes of syncope  2. Mild to moderate coronary artery disease by heart catheterization ( 2010)- no angina , doing well. He is at low risk for his surgery today .  3. Bipolar disease 4. Sleep apnea  5. Hyperlipidemia-   - followed by Dr. Brigitte Pulse.     Aaron Stout, Wonda Cheng, MD  10/08/2014 8:31 AM    [address],  [address] [place], [place]  [address] Pager [phone] Phone: [phone]; Fax: [phone]   [facility]  [address] [place], [place]  [address] [phone]   Fax [phone]

## 2014-10-08 NOTE — Anesthesia Procedure Notes (Addendum)
Date/Time: 10/08/2014 1:05 PM Performed by: Genevie Ann    Procedure Name: Intubation Date/Time: 10/08/2014 12:56 PM Performed by: STERLING, Sheron Nightingale Pre-anesthesia Checklist: Patient identified, Timeout performed, Emergency Drugs available, Suction available and Patient being monitored Patient Re-evaluated:Patient Re-evaluated prior to inductionOxygen Delivery Method: Circle system utilized Preoxygenation: Pre-oxygenation with 100% oxygen Intubation Type: IV induction and Cricoid Pressure applied Ventilation: Mask ventilation without difficulty and Oral airway inserted - appropriate to patient size Laryngoscope Size: Mac, Glidescope and 4 Grade View: Grade III Tube type: Oral Tube size: 7.5 mm Number of attempts: 2 Airway Equipment and Method: Rigid stylet Placement Confirmation: ETT inserted through vocal cords under direct vision,  positive ETCO2 and breath sounds checked- equal and bilateral Secured at: 24 cm Tube secured with: Tape Dental Injury: Teeth and Oropharynx as per pre-operative assessment

## 2014-10-08 NOTE — Anesthesia Preprocedure Evaluation (Addendum)
Anesthesia Evaluation  Patient identified by MRN, date of birth, ID band Patient awake    Reviewed: Allergy & Precautions, NPO status , Patient's Chart, lab work & pertinent test results  Airway Mallampati: II  TM Distance: >3 FB Neck ROM: Limited    Dental  (+) Teeth Intact   Pulmonary asthma , former smoker,  breath sounds clear to auscultation        Cardiovascular + dysrhythmias Rhythm:Regular Rate:Normal     Neuro/Psych  Headaches, PSYCHIATRIC DISORDERS Anxiety Depression Bipolar Disorder  Neuromuscular disease    GI/Hepatic GERD-  Medicated,  Endo/Other  negative endocrine ROS  Renal/GU negative Renal ROS     Musculoskeletal  (+) Arthritis -, Fibromyalgia -  Abdominal   Peds  Hematology   Anesthesia Other Findings   Reproductive/Obstetrics                            Lab Results  Component Value Date   WBC 6.7 09/26/2014   HGB 15.3 09/26/2014   HCT 44.0 09/26/2014   MCV 91.5 09/26/2014   PLT 176 09/26/2014   Lab Results  Component Value Date   CREATININE 1.29* 09/26/2014   BUN 13 09/26/2014   NA 138 09/26/2014   K 3.8 09/26/2014   CL 104 09/26/2014   CO2 24 09/26/2014   EKG: normal sinus rhythm, incomplete RBBB.  Perfusion Test 2016: The myoview shows no evidence of ischemia.  There is a question of mild LV dysfunction on the myoview.   Echo 2011: No valvular pathology, Preserved EF 50-60%  Anesthesia Physical Anesthesia Plan  ASA: II  Anesthesia Plan: General   Post-op Pain Management:    Induction: Intravenous  Airway Management Planned: Oral ETT  Additional Equipment:   Intra-op Plan:   Post-operative Plan:   Informed Consent: I have reviewed the patients History and Physical, chart, labs and discussed the procedure including the risks, benefits and alternatives for the proposed anesthesia with the patient or authorized representative who has indicated  his/her understanding and acceptance.   Dental advisory given  Plan Discussed with: CRNA  Anesthesia Plan Comments: (Anesthetic plan discussed in detail. Associated risk discussed including but not limited to life threatening cardiovascular, pulmonary events and dental damage. The postoperative pain management and antiemetic plan discussed with patient. All questions answered in detail. Patient is in agreement.    Discussed occasional palpitations with patient. He denies any recent events. Currently NSR.   Also discussed limited neck mobility, will proceed with Glidescope intubation secondary to limitations.  )       Anesthesia Quick Evaluation

## 2014-10-08 NOTE — Anesthesia Postprocedure Evaluation (Signed)
  Anesthesia Post-op Note  Patient: Aaron Stout  Procedure(s) Performed: Procedure(s): LAPAROSCOPIC CHOLECYSTECTOMY WITH INTRAOPERATIVE CHOLANGIOGRAM (N/A)  Patient Location: PACU  Anesthesia Type:General  Level of Consciousness: awake and alert   Airway and Oxygen Therapy: Patient Spontanous Breathing  Post-op Pain: mild  Post-op Assessment: Post-op Vital signs reviewed              Post-op Vital Signs: stable  Last Vitals:  Filed Vitals:   10/08/14 1930  BP:   Pulse:   Temp: 36.4 C  Resp:     Complications: No apparent anesthesia complications

## 2014-10-08 NOTE — Transfer of Care (Signed)
Immediate Anesthesia Transfer of Care Note  Patient: Aaron Stout  Procedure(s) Performed: Procedure(s): LAPAROSCOPIC CHOLECYSTECTOMY WITH INTRAOPERATIVE CHOLANGIOGRAM (N/A)  Patient Location: PACU  Anesthesia Type:General  Level of Consciousness: awake, oriented, sedated, patient cooperative and responds to stimulation  Airway & Oxygen Therapy: Patient Spontanous Breathing and Patient connected to nasal cannula oxygen  Post-op Assessment: Report given to RN, Post -op Vital signs reviewed and stable, Patient moving all extremities and Patient moving all extremities X 4  Post vital signs: Reviewed and stable  Last Vitals:  Filed Vitals:   10/08/14 1021  BP:   Pulse:   Temp: 36.4 C  Resp:     Complications: No apparent anesthesia complications

## 2014-10-08 NOTE — H&P (Signed)
H&P   Aaron Stout (MR# [id number])      H&P Info    Author Note Status Last Update User Last Update Date/Time   Erroll Luna, MD Signed Erroll Luna, MD 09/11/2014 4:34 PM    H&P    Expand All Collapse All   obert B. Mcduffee 09/11/2014 3:20 PM Location: [facility] Patient #: [id number] DOB: 1955-08-18 Married / Language: English / Race: White Male History of Present Illness Aaron Stout A. Saranya Harlin MD; 09/11/2014 4:33 PM) Patient words: gallbladder      Pt sent at the reques of Dr Brigitte Pulse for epigastric pain for the last 3 months. Seen in ED for severe pain episode and found to have gallstones.  CLINICAL DATA: Right upper quadrant and epigastric pain with nausea. Initial encounter.  EXAM: US ABDOMEN LIMITED - RIGHT UPPER QUADRANT  COMPARISON: Radiograph same date. No comparison studies.  FINDINGS: Gallbladder:  Multiple gallstones are present. There is mild pericholecystic fluid, but no gallbladder wall thickening or sonographic Murphy's sign.  Common bile duct:  Diameter: 1.8 mm. No evidence of choledocholithiasis.  Liver:  Diffusely increased echogenicity consistent with steatosis. There is probable sparing around the gallbladder. Within the left lobe, there is a mildly septated cyst measuring 1.4 cm maximally.  IMPRESSION: 1. Cholelithiasis without sonographic evidence of cholecystitis. 2. Fatty liver with cyst in the left lobe.   Electronically Signed By: [name] M.D. On: 08/21/2014 18:44.  The patient is a 59 year old male who presents for evaluation of gall stones. The onset of the gall stones has been gradual and has been occurring in an intermittent pattern for 8 hours. The course has been worsening. The gall stones is described as moderate to severe. There has been associated abdominal pain, anorexia, gastrointestinal upset, shoulder pain and vomiting. Precipitating factors include eating. Relieving factors include dietary  changes and medication. Other Problems Aaron Stout, CMA; 09/11/2014 3:20 PM) Anxiety Disorder Asthma Back Pain Cholelithiasis Enlarged Prostate Gastroesophageal Reflux Disease General anesthesia - complications Heart murmur Hemorrhoids Hypercholesterolemia Migraine Headache Sleep Apnea Ulcerative Colitis  Past Surgical History Aaron Stout, CMA; 09/11/2014 3:20 PM) Colon Polyp Removal - Colonoscopy Spinal Surgery - Lower Back Tonsillectomy TURP  Diagnostic Studies History Aaron Stout, CMA; 09/11/2014 3:20 PM) Colonoscopy 1-5 years ago  Allergies Aaron Stout, CMA; 09/11/2014 3:21 PM) Cymbalta *ANTIDEPRESSANTS* Ibuprofen *ANALGESICS - ANTI-INFLAMMATORY* Penicillin V *PENICILLINS*  Medication History Aaron Stout, CMA; 09/11/2014 3:28 PM) Doxycycline Hyclate (100MG  Tablet, Oral) Active. Azithromycin (250MG  Tablet, Oral) Active. ValACYclovir HCl (500MG  Tablet, Oral) Active. Adderall XR (30MG  Capsule ER 24HR, Oral) Active. Astelin (137MCG/SPRAY Solution, Nasal) Active. Diflucan (200MG  Tablet, Oral) Active. Flagyl (500MG  Tablet, Oral) Active. Omnicef (300MG  Capsule, Oral) Active. Perphenazine (8MG  Tablet, Oral) Active. Aspirin Low Strength (81MG  Tablet Chewable, Oral) Active. B6 Natural (100MG  Tablet, Oral) Active. Fiber (625MG  Tablet, Oral) Active. Zegerid (20MG  Packet, Oral) Active. Probiotic (Oral) Active. Glucosamine 1500 Complex (Oral) Active. Fish Oil (1000MG  Capsule, Oral two times daily) Active. Melatonin (10MG  Tablet, Oral) Active. Methylfol-Methylcob-Acetylcyst (6-2-600MG  Tablet, Oral) Active. Plaquenil (200MG  Tablet, Oral) Active. Pregnenolone Active. SEROquel (50MG  Tablet, Oral) Active. Medications Reconciled  Social History Aaron Stout, [place]; 09/11/2014 3:20 PM) Caffeine use Coffee. Illicit drug use Remotely quit drug use. No alcohol use Tobacco use Former smoker.  Family History Aaron Stout, [place]; 09/11/2014 3:20  PM) Breast Cancer Mother. Depression Brother. Migraine Headache Son.     Review of Systems Aaron Stout CMA; 09/11/2014 3:20 PM) General Present- Fatigue and Fever. Not Present- Appetite Loss, Chills, Night Sweats, Weight  Gain and Weight Loss. Skin Not Present- Change in Wart/Mole, Dryness, Hives, Jaundice, New Lesions, Non-Healing Wounds, Rash and Ulcer. HEENT Present- Hearing Loss, Ringing in the Ears, Seasonal Allergies, Visual Disturbances and Wears glasses/contact lenses. Not Present- Earache, Hoarseness, Nose Bleed, Oral Ulcers, Sinus Pain, Sore Throat and Yellow Eyes. Respiratory Present- Snoring and Wheezing. Not Present- Bloody sputum, Chronic Cough and Difficulty Breathing. Cardiovascular Present- Palpitations. Not Present- Chest Pain, Difficulty Breathing Lying Down, Leg Cramps, Rapid Heart Rate, Shortness of Breath and Swelling of Extremities. Gastrointestinal Present- Abdominal Pain, Bloating, Constipation, Excessive gas, Hemorrhoids and Indigestion. Not Present- Bloody Stool, Change in Bowel Habits, Chronic diarrhea, Difficulty Swallowing, Gets full quickly at meals, Nausea, Rectal Pain and Vomiting. Male Genitourinary Not Present- Blood in Urine, Change in Urinary Stream, Frequency, Impotence, Nocturia, Painful Urination, Urgency and Urine Leakage. Musculoskeletal Present- Back Pain, Joint Stiffness and Muscle Pain. Not Present- Joint Pain, Muscle Weakness and Swelling of Extremities. Neurological Present- Fainting and Headaches. Not Present- Decreased Memory, Numbness, Seizures, Tingling, Tremor, Trouble walking and Weakness. Psychiatric Present- Anxiety. Not Present- Bipolar, Change in Sleep Pattern, Depression, Fearful and Frequent crying. Endocrine Not Present- Cold Intolerance, Excessive Hunger, Hair Changes, Heat Intolerance, Hot flashes and New Diabetes. Hematology Not Present- Easy Bruising, Excessive bleeding, Gland problems, HIV and Persistent Infections.  Vitals  Aaron Stout CMA; 09/11/2014 3:28 PM) 09/11/2014 3:28 PM Weight: 225 lb Height: 73in Body Surface Area: 2.29 m Body Mass Index: 29.68 kg/m Temp.: 98.67F(Oral)  Pulse: 86 (Regular)  BP: 136/70 (Sitting, Left Arm, Standard)     Physical Exam (Merridith Dershem A. Aaryn Sermon MD; 09/11/2014 4:33 PM)  General Mental Status-Alert. General Appearance-Consistent with stated age. Hydration-Well hydrated. Voice-Normal.  Head and Neck Head-normocephalic, atraumatic with no lesions or palpable masses.  Eye Eyeball - Bilateral-Extraocular movements intact. Sclera/Conjunctiva - Bilateral-No scleral icterus.  Chest and Lung Exam Chest and lung exam reveals -quiet, even and easy respiratory effort with no use of accessory muscles and on auscultation, normal breath sounds, no adventitious sounds and normal vocal resonance. Inspection Chest Wall - Normal. Back - normal.  Cardiovascular Cardiovascular examination reveals -on palpation PMI is normal in location and amplitude, no palpable S3 or S4. Normal cardiac borders., normal heart sounds, regular rate and rhythm with no murmurs, carotid auscultation reveals no bruits and normal pedal pulses bilaterally.  Abdomen Inspection Inspection of the abdomen reveals - No Hernias. Skin - Scar - no surgical scars. Palpation/Percussion Palpation and Percussion of the abdomen reveal - Soft, Non Tender, No Rebound tenderness, No Rigidity (guarding) and No hepatosplenomegaly. Auscultation Auscultation of the abdomen reveals - Bowel sounds normal.  Neurologic Neurologic evaluation reveals -alert and oriented x 3 with no impairment of recent or remote memory. Mental Status-Normal.  Musculoskeletal Normal Exam - Left-Upper Extremity Strength Normal and Lower Extremity Strength Normal. Normal Exam - Right-Upper Extremity Strength Normal, Lower Extremity Weakness.    Assessment & Plan (Macrina Lehnert A. Aryelle Figg MD; 09/11/2014 4:33  PM)  SYMPTOMATIC CHOLELITHIASIS (574.20  K80.20) Impression: recommend laparscopic cholecystectomy and cholangiogram for symptomatic cholelithiasis  The procedure has been discussed with the patient. Risks of laparoscopic cholecystectomy include bleeding, infection, bile duct injury, leak, death, open surgery, diarrhea, other surgery, organ injury, blood vessel injury, DVT, and additional care.  Current Plans Pt Education - [facility] Laparoscopic Surgery HCI Pt Education - [facility] Laparosopic Post Op [facility]CI ([place])

## 2014-10-08 NOTE — Interval H&P Note (Signed)
History and Physical Interval Note:  10/08/2014 12:08 PM  [name]  has presented today for surgery, with the diagnosis of Gallstones  The various methods of treatment have been discussed with the patient and family. After consideration of risks, benefits and other options for treatment, the patient has consented to  Procedure(s): LAPAROSCOPIC CHOLECYSTECTOMY WITH INTRAOPERATIVE CHOLANGIOGRAM (N/A) as a surgical intervention .  The patient's history has been reviewed, patient examined, no change in status, stable for surgery.  I have reviewed the patient's chart and labs.  Questions were answered to the patient's satisfaction.     Aaron Stout A.

## 2014-10-08 NOTE — Discharge Instructions (Signed)
[facility] ______[facility] °LAPAROSCOPIC SURGERY: POST OP INSTRUCTIONS °Always review your discharge instruction sheet given to you by the facility where your surgery was performed. °IF YOU HAVE DISABILITY OR FAMILY LEAVE FORMS, YOU MUST BRING THEM TO THE OFFICE FOR PROCESSING.   °DO NOT GIVE THEM TO YOUR DOCTOR. ° °1. A prescription for pain medication may be given to you upon discharge.  Take your pain medication as prescribed, if needed.  If narcotic pain medicine is not needed, then you may take acetaminophen (Tylenol) or ibuprofen (Advil) as needed. °2. Take your usually prescribed medications unless otherwise directed. °3. If you need a refill on your pain medication, please contact your pharmacy.  They will contact our office to request authorization. Prescriptions will not be filled after 5pm or on week-ends. °4. You should follow a light diet the first few days after arrival home, such as soup and crackers, etc.  Be sure to include lots of fluids daily. °5. Most patients will experience some swelling and bruising in the area of the incisions.  Ice packs will help.  Swelling and bruising can take several days to resolve.  °6. It is common to experience some constipation if taking pain medication after surgery.  Increasing fluid intake and taking a stool softener (such as Colace) will usually help or prevent this problem from occurring.  A mild laxative (Milk of Magnesia or Miralax) should be taken according to package instructions if there are no bowel movements after 48 hours. °7. Unless discharge instructions indicate otherwise, you may remove your bandages 24-48 hours after surgery, and you may shower at that time.  You may have steri-strips (small skin tapes) in place directly over the incision.  These strips should be left on the skin for 7-10 days.  If your surgeon used skin glue on the incision, you may shower in 24 hours.  The glue will flake off over the next 2-3 weeks.  Any sutures or  staples will be removed at the office during your follow-up visit. °8. ACTIVITIES:  You may resume regular (light) daily activities beginning the next day--such as daily self-care, walking, climbing stairs--gradually increasing activities as tolerated.  You may have sexual intercourse when it is comfortable.  Refrain from any heavy lifting or straining until approved by your doctor. °a. You may drive when you are no longer taking prescription pain medication, you can comfortably wear a seatbelt, and you can safely maneuver your car and apply brakes. °b. RETURN TO WORK:  __________________________________________________________ °9. You should see your doctor in the office for a follow-up appointment approximately 2-3 weeks after your surgery.  Make sure that you call for this appointment within a day or two after you arrive home to insure a convenient appointment time. °10. OTHER INSTRUCTIONS: __________________________________________________________________________________________________________________________ __________________________________________________________________________________________________________________________ °WHEN TO CALL YOUR DOCTOR: °1. Fever over 101.0 °2. Inability to urinate °3. Continued bleeding from incision. °4. Increased pain, redness, or drainage from the incision. °5. Increasing abdominal pain ° °The clinic staff is available to answer your questions during regular business hours.  Please don’t hesitate to call and ask to speak to one of the nurses for clinical concerns.  If you have a medical emergency, go to the nearest emergency room or call 911.  A surgeon from [facility] is always on call at the hospital. °[address], [address], [place], [place]  [address] ? [address] [address], [place], [place]   [address] °[phone] ? [phone] ? FAX [phone] °Web site:   [email or web address] °

## 2014-10-08 NOTE — Patient Instructions (Signed)
Medication Instructions:  Your physician recommends that you continue on your current medications as directed. Please refer to the Current Medication list given to you today.   Labwork: None Ordered   Testing/Procedures: None Ordered   Follow-Up: Your physician wants you to follow-up in: 1 year with Dr. Acie Fredrickson.  You will receive a reminder letter in the mail two months in advance. If you don't receive a letter, please call our office to schedule the follow-up appointment.   Your physician has given you cardiac clearance for your surgery today

## 2014-10-08 NOTE — Op Note (Signed)
Laparoscopic Cholecystectomy with IOC Procedure Note  Indications: This patient presents with symptomatic gallbladder disease and will undergo laparoscopic cholecystectomy.The procedure has been discussed with the patient. Operative and non operative treatments have been discussed. Risks of surgery include bleeding, infection,  Common bile duct injury,  Injury to the stomach,liver, colon,small intestine, abdominal wall,  Diaphragm,  Major blood vessels,  And the need for an open procedure.  Other risks include worsening of medical problems, death,  DVT and pulmonary embolism, and cardiovascular events.   Medical options have also been discussed. The patient has been informed of long term expectations of surgery and non surgical options,  The patient agrees to proceed.    Pre-operative Diagnosis: Calculus of gallbladder without mention of cholecystitis or obstruction  Post-operative Diagnosis: Same  Surgeon: Chanae Gemma A.   Assistants: Hitchcock RNFA   Anesthesia: General endotracheal anesthesia and Local anesthesia 0.25.% bupivacaine, with epinephrine  ASA Class: 2  Procedure Details  The patient was seen again in the Holding Room. The risks, benefits, complications, treatment options, and expected outcomes were discussed with the patient. The possibilities of reaction to medication, pulmonary aspiration, perforation of viscus, bleeding, recurrent infection, finding a normal gallbladder, the need for additional procedures, failure to diagnose a condition, the possible need to convert to an open procedure, and creating a complication requiring transfusion or operation were discussed with the patient. The patient and/or family concurred with the proposed plan, giving informed consent. The site of surgery properly noted/marked. The patient was taken to Operating Room, identified as Aaron Stout and the procedure verified as Laparoscopic Cholecystectomy with Intraoperative Cholangiograms. A Time  Out was held and the above information confirmed.  Prior to the induction of general anesthesia, antibiotic prophylaxis was administered. General endotracheal anesthesia was then administered and tolerated well. After the induction, the abdomen was prepped in the usual sterile fashion. The patient was positioned in the supine position with the left arm comfortably tucked, along with some reverse Trendelenburg.  Local anesthetic agent was injected into the skin near the umbilicus and an incision made. The midline fascia was incised and the Hasson technique was used to introduce a 12 mm port under direct vision. It was secured with a figure of eight Vicryl suture placed in the usual fashion. Pneumoperitoneum was then created with CO2 and tolerated well without any adverse changes in the patient's vital signs. Additional trocars were introduced under direct vision with an 11 mm trocar in the epigastrium and 2 5 mm trocars in the right upper quadrant. All skin incisions were infiltrated with a local anesthetic agent before making the incision and placing the trocars.   The gallbladder was identified, the fundus grasped and retracted cephalad. Adhesions were lysed bluntly and with the electrocautery where indicated, taking care not to injure any adjacent organs or viscus. The infundibulum was grasped and retracted laterally, exposing the peritoneum overlying the triangle of Calot. This was then divided and exposed in a blunt fashion. The cystic duct was clearly identified and bluntly dissected circumferentially. The junctions of the gallbladder, cystic duct and common bile duct were clearly identified prior to the division of any linear structure.   An incision was made in the cystic duct and the cholangiogram catheter introduced. The catheter was secured using an endoclip. The study showed no stones and good visualization of the distal and proximal biliary tree. The catheter was then removed.   The cystic duct  was then  ligated with surgical clips  on the patient side  and  clipped on the gallbladder side and divided. The cystic artery was identified, dissected free, ligated with clips and divided as well. Posterior cystic artery clipped and divided.  The gallbladder was dissected from the liver bed in retrograde fashion with the electrocautery. The gallbladder was removed. The liver bed was irrigated and inspected. Hemostasis was achieved with the electrocautery. Copious irrigation was utilized and was repeatedly aspirated until clear all particulate matter. Hemostasis was achieved with cautery and Suricel snow with no signs  of bleeding or bile leakage.  Pneumoperitoneum was completely reduced after viewing removal of the trocars under direct vision. The wound was thoroughly irrigated and the fascia was then closed with a figure of eight suture; the skin was then closed with 4 0 monocryl  and a sterile dressing of liquid adhesive  was applied.  Instrument, sponge, and needle counts were correct at closure and at the conclusion of the case.   Findings:  Cholelithiasis  Estimated Blood Loss: less than 100 mL         Drains: none          Total IV Fluids: 800 mL         Specimens: Gallbladder           Complications: None; patient tolerated the procedure well.         Disposition: PACU - hemodynamically stable.         Condition: stable

## 2014-10-09 ENCOUNTER — Encounter (HOSPITAL_COMMUNITY): Payer: Self-pay | Admitting: Surgery

## 2015-03-30 ENCOUNTER — Other Ambulatory Visit: Payer: Self-pay | Admitting: Surgery

## 2015-03-30 DIAGNOSIS — R1900 Intra-abdominal and pelvic swelling, mass and lump, unspecified site: Secondary | ICD-10-CM

## 2015-04-07 ENCOUNTER — Ambulatory Visit
Admission: RE | Admit: 2015-04-07 | Discharge: 2015-04-07 | Disposition: A | Discharge status: Home / Self Care | Payer: BLUE CROSS/BLUE SHIELD | Source: Ambulatory Visit | Attending: Surgery | Admitting: Surgery

## 2015-04-07 DIAGNOSIS — R1900 Intra-abdominal and pelvic swelling, mass and lump, unspecified site: Secondary | ICD-10-CM

## 2015-04-07 MED ORDER — IOPAMIDOL (ISOVUE-300) INJECTION 61%
100.0000 mL | Freq: Once | INTRAVENOUS | Status: AC | PRN
  Administered 2015-04-07: 100 mL via INTRAVENOUS

## 2015-10-15 ENCOUNTER — Telehealth: Payer: Self-pay | Admitting: Internal Medicine

## 2015-10-15 NOTE — Telephone Encounter (Signed)
appt has been made for 11/04/15 at 915 am he has severe fatigue and upper abd pain.  The pt is aware

## 2015-11-04 ENCOUNTER — Other Ambulatory Visit: Payer: BLUE CROSS/BLUE SHIELD

## 2015-11-04 ENCOUNTER — Encounter: Payer: Self-pay | Admitting: Internal Medicine

## 2015-11-04 ENCOUNTER — Telehealth: Payer: Self-pay | Admitting: Internal Medicine

## 2015-11-04 ENCOUNTER — Ambulatory Visit (INDEPENDENT_AMBULATORY_CARE_PROVIDER_SITE_OTHER): Payer: BLUE CROSS/BLUE SHIELD | Admitting: Internal Medicine

## 2015-11-04 VITALS — BP 128/78 | HR 72 | Ht 72.75 in | Wt 235.4 lb

## 2015-11-04 DIAGNOSIS — R6881 Early satiety: Secondary | ICD-10-CM

## 2015-11-04 DIAGNOSIS — R1013 Epigastric pain: Secondary | ICD-10-CM | POA: Diagnosis not present

## 2015-11-04 DIAGNOSIS — R5382 Chronic fatigue, unspecified: Secondary | ICD-10-CM | POA: Diagnosis not present

## 2015-11-04 DIAGNOSIS — K529 Noninfective gastroenteritis and colitis, unspecified: Secondary | ICD-10-CM

## 2015-11-04 DIAGNOSIS — G9332 Myalgic encephalomyelitis/chronic fatigue syndrome: Secondary | ICD-10-CM

## 2015-11-04 MED ORDER — COLESTIPOL HCL 5 G PO GRAN: 5.0000 g | GRANULES | Freq: Every day | ORAL | 0 refills | Status: DC

## 2015-11-04 NOTE — Telephone Encounter (Signed)
Dr Carlean Purl please advise, thank you.

## 2015-11-04 NOTE — Telephone Encounter (Signed)
See what I put on his med list Check what dose he has - if 10 mng take 2 if 20 mg take one  If it is going to help it will work in beginning so use what he has and if helpful call for refill

## 2015-11-04 NOTE — Telephone Encounter (Signed)
He has the 20mg  , please elucidate is that once daily or tid you want him to use it.  He is requesting a refill.

## 2015-11-04 NOTE — Progress Notes (Signed)
   Subjective:    Patient ID: Aaron Stout, male    DOB: 07/18/1955, 60 y.o.   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 Also has severe chronic fatigue sxs - being treated for uspect Lyme DZ by Dr. Vaugan. Pain and sxs worsened so antibiotics (Omnicef, doxycycline, azithromycin)  and hydroxychloroquine were held x 1- 2 weeks but no change in sxs. +++early satiety. Some nausea and appetite off. Frustrated and hopeful for relief of these sxs especially the fatigue. Has been rxed Bentyl - tried - cannot recall if helped - has at home still. Has tried digestive enzyme supplement w/ ? Slight benefit.   Several EGD's over the yrs as far back as 1994 w/o ID of cause.   Also having loose stools and diarrhea after lap chole last year - had stones and upper abd pain.    09/15/2015 PCP note indicates he stopped SAM-e, (depression Tx) , P5P reduced  And Adderall was dced by Dr. Cottle. Weight up after tthat.   Rec to stop the Biotic detoxification support, , try enteragam,  Medications, allergies, past medical history, past surgical history, family history and social history are reviewed and updated in the EMR.   Review of Systems + insomnia, Tx by Dr. Cottle    Objective:   Physical Exam @BP 128/78 (BP Location: Left Arm, Patient Position: Sitting, Cuff Size: Normal)   Pulse 72   Ht 6' 0.75" (1.848 m) Comment: height measured without shoes  Wt 235 lb 6 oz (106.8 kg)   BMI 31.27 kg/m @  General:  NAD Eyes:   anicteric Lungs:  clear Heart::  S1S2 no rubs, murmurs or gallops Abdomen:  soft and nontender, BS+ Ext:   no edema, cyanosis or clubbing    Data Reviewed:   09/15/15 vit D 62 CMET - Co2 18, all ok o/w testosteron NL but sex hormone bindig globulin sl low at 20 TSH NL and T3T4 NL'Coenzye10 hifgh 3.79 Homocysteine 10.1 % CD57 lymphocytes  sl high at 9%       Assessment & Plan:   Encounter Diagnoses  Name Primary?  . Abdominal pain, epigastric Yes  . Chronic diarrhea   . Chronic fatigue   . Early satiety     Labs: Celiac panel and HLA testing Meds: Stop digestive aid Retry dicyclomine Colestipol 5 g w/ supper Studies: EGD The risks and benefits as well as alternatives of endoscopic procedure(s) have been discussed and reviewed. All questions answered. The patient agrees to proceed.  I appreciate the opportunity to care for this patient. Cc:Elizabeth Vaughan, MD 

## 2015-11-04 NOTE — Patient Instructions (Addendum)
You have been scheduled for an endoscopy. Please follow written instructions given to you at your visit today. If you use inhalers (even only as needed), please bring them with you on the day of your procedure. Your physician has requested that you go to www.startemmi.com and enter the access code given to you at your visit today. This web site gives a general overview about your procedure. However, you should still follow specific instructions given to you by our office regarding your preparation for the procedure.   Retry your dicyclomine and stop your digestive enzymes.  We have sent the following medications to your pharmacy for you to pick up at your convenience: Colestipol  Your physician has requested that you go to the basement for the lab work before leaving today.   I appreciate the opportunity to care for you. Silvano Rusk, MD, [facility]

## 2015-11-05 LAB — GLIADIN ANTIBODIES, SERUM
GLIADIN IGA: 5 U (ref <20)
GLIADIN IGG: 2 U (ref <20)

## 2015-11-05 LAB — TISSUE TRANSGLUTAMINASE, IGA: Tissue Transglutaminase Ab, IgA: 1 U/mL (ref <4)

## 2015-11-05 MED ORDER — DICYCLOMINE HCL 20 MG PO TABS: 20.0000 mg | ORAL_TABLET | Freq: Three times a day (TID) | ORAL | 0 refills | Status: DC

## 2015-11-05 NOTE — Telephone Encounter (Signed)
Left patient detailed message on his cell number about his dicyclomine and the instructions.

## 2015-11-05 NOTE — Telephone Encounter (Signed)
Take 20 mg dicyclomine tidac/hs  #120 no refills

## 2015-11-05 NOTE — Telephone Encounter (Signed)
Left patient detailed message that dicyclomine refill sent in and how to take it. He is getting scoped tomorrow and if has any additional questions Dr Carlean Purl can answer them at that time.

## 2015-11-06 ENCOUNTER — Encounter: Payer: Self-pay | Admitting: Internal Medicine

## 2015-11-06 ENCOUNTER — Ambulatory Visit (AMBULATORY_SURGERY_CENTER): Payer: BLUE CROSS/BLUE SHIELD | Admitting: Internal Medicine

## 2015-11-06 VITALS — BP 138/83 | HR 66 | Temp 98.0°F | Resp 18 | Ht 72.0 in | Wt 235.0 lb

## 2015-11-06 DIAGNOSIS — K317 Polyp of stomach and duodenum: Secondary | ICD-10-CM

## 2015-11-06 DIAGNOSIS — K2970 Gastritis, unspecified, without bleeding: Secondary | ICD-10-CM

## 2015-11-06 DIAGNOSIS — K2950 Unspecified chronic gastritis without bleeding: Secondary | ICD-10-CM | POA: Diagnosis not present

## 2015-11-06 DIAGNOSIS — R1010 Upper abdominal pain, unspecified: Secondary | ICD-10-CM

## 2015-11-06 DIAGNOSIS — K2990 Gastroduodenitis, unspecified, without bleeding: Secondary | ICD-10-CM

## 2015-11-06 MED ORDER — SODIUM CHLORIDE 0.9 % IV SOLN: 500.0000 mL | INTRAVENOUS | Status: DC

## 2015-11-06 NOTE — Op Note (Signed)
[facility] Patient Name: Aaron Stout Procedure Date: 11/06/2015 1:57 PM MRN: [id number] Endoscopist: [name] , MD Age: 60 Referring MD:  Date of Birth: 12/08/1955 Gender: Male Account #: [id number] Procedure:                Upper GI endoscopy Indications:              Epigastric abdominal pain, Dyspepsia, Early satiety Medicines:                Propofol per Anesthesia, Monitored Anesthesia Care Procedure:                Pre-Anesthesia Assessment:                           - Prior to the procedure, a History and Physical                            was performed, and patient medications and                            allergies were reviewed. The patient's tolerance of                            previous anesthesia was also reviewed. The risks                            and benefits of the procedure and the sedation                            options and risks were discussed with the patient.                            All questions were answered, and informed consent                            was obtained. Prior Anticoagulants: The patient                            last took aspirin 1 day prior to the procedure. ASA                            Grade Assessment: III - A patient with severe                            systemic disease. After reviewing the risks and                            benefits, the patient was deemed in satisfactory                            condition to undergo the procedure.                           After obtaining informed consent, the endoscope was  passed under direct vision. Throughout the                            procedure, the patient's blood pressure, pulse, and                            oxygen saturations were monitored continuously. The                            Model GIF-HQ190 [id number]) scope was introduced                            through the mouth, and advanced to the second part            of duodenum. The upper GI endoscopy was                            accomplished without difficulty. The patient                            tolerated the procedure well. Scope In: Scope Out: Findings:                 Patchy moderate inflammation characterized by                            erythema, friability and granularity was found in                            the gastric antrum. Biopsies were taken with a cold                            forceps for histology. Verification of patient                            identification for the specimen was done. Estimated                            blood loss was minimal.                           Multiple 3 to 8 mm semi-sessile polyps with no                            stigmata of recent bleeding were found in the                            gastric body. Biopsies were taken with a cold                            forceps for histology. Verification of patient                            identification for the specimen was done. Estimated  blood loss was minimal.                           A 2 cm hiatal hernia was present.                           The exam was otherwise without abnormality.                           The cardia and gastric fundus were normal on                            retroflexion. Complications:            No immediate complications. Estimated Blood Loss:     Estimated blood loss was minimal. Impression:               - Gastritis. Biopsied.                           - Multiple gastric polyps. Biopsied.                           - 2 cm hiatal hernia.                           - The examination was otherwise normal. Recommendation:           - Patient has a contact number available for                            emergencies. The signs and symptoms of potential                            delayed complications were discussed with the                            patient. Return to normal activities  tomorrow.                            Written discharge instructions were provided to the                            patient.                           - Resume previous diet.                           - Continue present medications.                           - Await pathology results.                           - Since dicyslomine did not help him and made him  sleepy so will stop.                           Once biospies return other Tx considerations are                            buspirone, ? mirtazapine. Would probably need some                            other meds adjusted.                           Also consider high-dose H2 blockers.                           Keep in mind that doxycycline and                            hydroxychloroquine have decent rates of GI side                            effects and might be part of the problem.                           - Will CC: Dr. Edison Nasuti, MD 11/06/2015 2:17:08 PM This report has been signed electronically.

## 2015-11-06 NOTE — Patient Instructions (Addendum)
   The celiac tests were negative so far.  The stomach is inflamed and there are stomach polyps. I took biopsies to gain more information and will call you with results and plans.  I appreciate the opportunity to care for you. [name], MD, FACG  YOU HAD AN ENDOSCOPIC PROCEDURE TODAY AT [facility]:   Refer to the procedure report that was given to you for any specific questions about what was found during the examination.  If the procedure report does not answer your questions, please call your gastroenterologist to clarify.  If you requested that your care partner not be given the details of your procedure findings, then the procedure report has been included in a sealed envelope for you to review at your convenience later.  YOU SHOULD EXPECT: Some feelings of bloating in the abdomen. Passage of more gas than usual.  Walking can help get rid of the air that was put into your GI tract during the procedure and reduce the bloating. If you had a lower endoscopy (such as a colonoscopy or flexible sigmoidoscopy) you may notice spotting of blood in your stool or on the toilet paper. If you underwent a bowel prep for your procedure, you may not have a normal bowel movement for a few days.  Please Note:  You might notice some irritation and congestion in your nose or some drainage.  This is from the oxygen used during your procedure.  There is no need for concern and it should clear up in a day or so.  SYMPTOMS TO REPORT IMMEDIATELY:   Following upper endoscopy (EGD)  Vomiting of blood or coffee ground material  New chest pain or pain under the shoulder blades  Painful or persistently difficult swallowing  New shortness of breath  Fever of 100F or higher  Black, tarry-looking stools  For urgent or emergent issues, a gastroenterologist can be reached at any hour by calling [phone].   DIET:  We do recommend a small meal at first, but then you may proceed to  your regular diet.  Drink plenty of fluids but you should avoid alcoholic beverages for 24 hours.  ACTIVITY:  You should plan to take it easy for the rest of today and you should NOT DRIVE or use heavy machinery until tomorrow (because of the sedation medicines used during the test).    FOLLOW UP: Our staff will call the number listed on your records the next business day following your procedure to check on you and address any questions or concerns that you may have regarding the information given to you following your procedure. If we do not reach you, we will leave a message.  However, if you are feeling well and you are not experiencing any problems, there is no need to return our call.  We will assume that you have returned to your regular daily activities without incident.  If any biopsies were taken you will be contacted by phone or by letter within the next 1-3 weeks.  Please call us at [phone] if you have not heard about the biopsies in 3 weeks.    SIGNATURES/CONFIDENTIALITY: You and/or your care partner have signed paperwork which will be entered into your electronic medical record.  These signatures attest to the fact that that the information above on your After Visit Summary has been reviewed and is understood.  Full responsibility of the confidentiality of this discharge information lies with you and/or your care-partner.

## 2015-11-06 NOTE — Progress Notes (Signed)
To recovery report to [facility]

## 2015-11-06 NOTE — Progress Notes (Signed)
Called to room to assist during endoscopic procedure.  Patient ID and intended procedure confirmed with present staff. Received instructions for my participation in the procedure from the performing physician.  

## 2015-11-07 ENCOUNTER — Encounter: Payer: Self-pay | Admitting: Internal Medicine

## 2015-11-07 LAB — RETICULIN ANTIBODIES, IGA W TITER: Reticulin Ab, IgA: NEGATIVE

## 2015-11-10 ENCOUNTER — Telehealth: Payer: Self-pay | Admitting: *Deleted

## 2015-11-10 NOTE — Telephone Encounter (Signed)
  Follow up Call-  Call back number 11/06/2015 09/11/2013  Post procedure Call Back phone  # [phone]  Permission to leave phone message Yes Yes  Some recent data might be hidden     No answer at # given.  Left message on VM.

## 2015-11-11 NOTE — Progress Notes (Signed)
MY CHART MESSAGE SENT No letter needed   Mild gastritis and fundic gland polyps Please cc Dr. Lianne Cure 9under direction of Dr. Clovis Pu) this could not be a problem

## 2015-11-12 LAB — CELIAC DISEASE HLA DQ ASSOC.
DQ2 (DQA1 0501/0505,DQB1 02XX): POSITIVE
DQ8 (DQA1 03XX, DQB1 0302): NEGATIVE

## 2015-12-01 ENCOUNTER — Other Ambulatory Visit: Payer: Self-pay | Admitting: Internal Medicine

## 2016-01-03 ENCOUNTER — Other Ambulatory Visit: Payer: Self-pay | Admitting: Internal Medicine

## 2016-02-11 ENCOUNTER — Other Ambulatory Visit: Payer: Self-pay | Admitting: Internal Medicine

## 2016-03-13 ENCOUNTER — Other Ambulatory Visit: Payer: Self-pay | Admitting: Internal Medicine

## 2016-04-11 ENCOUNTER — Other Ambulatory Visit: Payer: Self-pay | Admitting: Internal Medicine

## 2016-04-13 ENCOUNTER — Telehealth: Payer: Self-pay

## 2016-04-13 ENCOUNTER — Ambulatory Visit: Payer: BLUE CROSS/BLUE SHIELD | Admitting: Physician Assistant

## 2016-04-13 NOTE — Telephone Encounter (Signed)
SENT NOTE TO SCHEDULING 

## 2016-07-26 ENCOUNTER — Other Ambulatory Visit: Payer: Self-pay | Admitting: Internal Medicine

## 2016-07-27 ENCOUNTER — Telehealth: Payer: Self-pay

## 2016-07-27 MED ORDER — COLESTIPOL HCL 1 G PO TABS: 1.0000 g | ORAL_TABLET | Freq: Every day | ORAL | 0 refills | Status: DC

## 2016-07-27 NOTE — Telephone Encounter (Signed)
Colestipol granles packets on backorder.  Sending in tablets to replace.  I called [facility] to confirm they have the tablets.

## 2016-08-17 ENCOUNTER — Telehealth: Payer: Self-pay | Admitting: Internal Medicine

## 2016-08-17 NOTE — Telephone Encounter (Signed)
Please advise Sir, thank you. 

## 2016-08-18 NOTE — Telephone Encounter (Signed)
Left Aaron Stout a message that he should be on 5 tablets a day not one of the colestipol. Perhaps that is why its not working . In the message I told him we can switch him if he likes.

## 2016-08-18 NOTE — Telephone Encounter (Signed)
That is because the dose is wrong - should be taking 5 tablets a day was on 5 g packet daily - that is on backorder  We can try cholestyramine light 4 g with supper instead if he does not want to take 5 tabs or if the colestipol granules are not available  May rx for 1 year are

## 2016-08-19 MED ORDER — COLESTIPOL HCL 5 G PO GRAN: 5.0000 g | GRANULES | Freq: Every day | ORAL | 3 refills | Status: DC

## 2016-08-19 NOTE — Addendum Note (Signed)
Addended by: [place], Derriona Branscom E on: 08/19/2016 02:25 PM   Modules accepted: Orders

## 2016-08-19 NOTE — Telephone Encounter (Signed)
Per patient request I called the pharmacy and they said they can get the colestid  Granules from one manufacture now.  I have sent that in as the patient said that worked well for him.  He is aware that the new rx has been sent in.

## 2016-10-31 ENCOUNTER — Encounter: Payer: Self-pay | Admitting: Internal Medicine

## 2016-12-05 ENCOUNTER — Encounter: Payer: Self-pay | Admitting: Internal Medicine

## 2017-02-16 ENCOUNTER — Other Ambulatory Visit: Payer: Self-pay

## 2017-02-16 ENCOUNTER — Ambulatory Visit (AMBULATORY_SURGERY_CENTER): Payer: Self-pay | Admitting: *Deleted

## 2017-02-16 VITALS — Ht 73.0 in | Wt 241.0 lb

## 2017-02-16 DIAGNOSIS — Z86010 Personal history of colonic polyps: Secondary | ICD-10-CM

## 2017-02-16 MED ORDER — COLESTIPOL HCL 5 G PO GRAN: 5.0000 g | GRANULES | Freq: Every day | ORAL | 3 refills | Status: DC

## 2017-02-16 NOTE — Progress Notes (Signed)
No egg or soy allergy known to patient  No issues with past sedation with any surgeries  or procedures, no intubation problems  No diet pills per patient No home 02 use per patient  No blood thinners per patient  Pt denies issues with constipation  No A fib or A flutter  EMMI video sent to pt's e mail pt declined   

## 2017-02-17 ENCOUNTER — Encounter: Payer: Self-pay | Admitting: Internal Medicine

## 2017-02-20 HISTORY — PX: COLONOSCOPY: SHX174

## 2017-03-02 ENCOUNTER — Encounter: Payer: Self-pay | Admitting: Internal Medicine

## 2017-03-02 ENCOUNTER — Ambulatory Visit (AMBULATORY_SURGERY_CENTER): Payer: BLUE CROSS/BLUE SHIELD | Admitting: Internal Medicine

## 2017-03-02 VITALS — BP 144/72 | HR 74 | Temp 96.8°F | Resp 25 | Ht 73.0 in | Wt 241.0 lb

## 2017-03-02 DIAGNOSIS — D124 Benign neoplasm of descending colon: Secondary | ICD-10-CM | POA: Diagnosis not present

## 2017-03-02 DIAGNOSIS — D122 Benign neoplasm of ascending colon: Secondary | ICD-10-CM

## 2017-03-02 DIAGNOSIS — D123 Benign neoplasm of transverse colon: Secondary | ICD-10-CM

## 2017-03-02 DIAGNOSIS — Z86010 Personal history of colonic polyps: Secondary | ICD-10-CM | POA: Diagnosis not present

## 2017-03-02 MED ORDER — SODIUM CHLORIDE 0.9 % IV SOLN: 500.0000 mL | Freq: Once | INTRAVENOUS | Status: DC

## 2017-03-02 NOTE — Op Note (Signed)
[facility] Patient Name: Aaron Stout Procedure Date: 03/02/2017 9:42 AM MRN: [id number] Endoscopist: [name] , MD Age: 61 Referring MD:  Date of Birth: 02-01-1956 Gender: Male Account #: [id number] Procedure:                Colonoscopy Indications:              Surveillance: Personal history of adenomatous                            polyps on last colonoscopy 3 years ago Medicines:                Propofol per Anesthesia, Monitored Anesthesia Care Procedure:                Pre-Anesthesia Assessment:                           - Prior to the procedure, a History and Physical                            was performed, and patient medications and                            allergies were reviewed. The patient's tolerance of                            previous anesthesia was also reviewed. The risks                            and benefits of the procedure and the sedation                            options and risks were discussed with the patient.                            All questions were answered, and informed consent                            was obtained. Prior Anticoagulants: The patient has                            taken no previous anticoagulant or antiplatelet                            agents. ASA Grade Assessment: III - A patient with                            severe systemic disease. After reviewing the risks                            and benefits, the patient was deemed in                            satisfactory condition to undergo the procedure.  After obtaining informed consent, the colonoscope                            was passed under direct vision. Throughout the                            procedure, the patient's blood pressure, pulse, and                            oxygen saturations were monitored continuously. The                            Colonoscope was introduced through the anus and   advanced to the the cecum, identified by                            appendiceal orifice and ileocecal valve. The                            colonoscopy was performed without difficulty. The                            patient tolerated the procedure well. The quality                            of the bowel preparation was good. The bowel                            preparation used was Miralax. The ileocecal valve,                            appendiceal orifice, and rectum were photographed. Scope In: 10:09:16 AM Scope Out: 10:31:43 AM Scope Withdrawal Time: 0 hours 18 minutes 46 seconds  Total Procedure Duration: 0 hours 22 minutes 27 seconds  Findings:                 The perianal and digital rectal examinations were                            normal. Pertinent negatives include normal prostate                            (size, shape, and consistency).                           A 10 mm polyp was found in the descending colon.                            The polyp was semi-pedunculated. The polyp was                            removed with a hot snare. Resection and retrieval  were complete. Verification of patient                            identification for the specimen was done. Estimated                            blood loss: none.                           Three sessile polyps were found in the descending                            colon, transverse colon and ascending colon. The                            polyps were diminutive in size. These polyps were                            removed with a cold snare. Resection and retrieval                            were complete. Verification of patient                            identification for the specimen was done. Estimated                            blood loss was minimal.                           A 2 mm polyp was found in the descending colon. The                            polyp was sessile. The polyp was  removed with a                            cold biopsy forceps. Resection and retrieval were                            complete. Verification of patient identification                            for the specimen was done. Estimated blood loss was                            minimal.                           Multiple small and large-mouthed diverticula were                            found in the sigmoid colon. There was narrowing of  the colon in association with the diverticular                            opening.                           The exam was otherwise without abnormality on                            direct and retroflexion views. Complications:            No immediate complications. Estimated Blood Loss:     Estimated blood loss was minimal. Impression:               - One 10 mm polyp in the descending colon, removed                            with a hot snare. Resected and retrieved.                           - Three diminutive polyps in the descending colon,                            in the transverse colon and in the ascending colon,                            removed with a cold snare. Resected and retrieved.                           - One 2 mm polyp in the descending colon, removed                            with a cold biopsy forceps. Resected and retrieved.                           - Moderate diverticulosis in the sigmoid colon.                            There was narrowing of the colon in association                            with the diverticular opening.                           - The examination was otherwise normal on direct                            and retroflexion views.                           - Personal history of colonic polyps. Multiple                            adenomas Recommendation:           - Patient has a  contact number available for                            emergencies. The signs and symptoms of potential                             delayed complications were discussed with the                            patient. Return to normal activities tomorrow.                            Written discharge instructions were provided to the                            patient.                           - Resume previous diet.                           - Continue present medications.                           - No aspirin, ibuprofen, naproxen, or other                            non-steroidal anti-inflammatory drugs for 2 weeks                            after polyp removal.                           - Repeat colonoscopy is recommended for                            surveillance. The colonoscopy date will be                            determined after pathology results from today's                            exam become available for review. [name], MD 03/02/2017 10:39:03 AM This report has been signed electronically.

## 2017-03-02 NOTE — Progress Notes (Signed)
Report given to PACU, vss 

## 2017-03-02 NOTE — Progress Notes (Signed)
Called to room to assist during endoscopic procedure.  Patient ID and intended procedure confirmed with present staff. Received instructions for my participation in the procedure from the performing physician.  

## 2017-03-02 NOTE — Patient Instructions (Addendum)
*Handouts given to patient on polyps and diverticulosis.    I removed 5 benign-appearing polyps today. I will let you know pathology results and when to have another routine colonoscopy by mail and/or My Chart. I anticipate recommending looking again in 3 years.  I appreciate the opportunity to care for you. [name], MD, FACG   YOU HAD AN ENDOSCOPIC PROCEDURE TODAY AT [facility]:   Refer to the procedure report that was given to you for any specific questions about what was found during the examination.  If the procedure report does not answer your questions, please call your gastroenterologist to clarify.  If you requested that your care partner not be given the details of your procedure findings, then the procedure report has been included in a sealed envelope for you to review at your convenience later.  YOU SHOULD EXPECT: Some feelings of bloating in the abdomen. Passage of more gas than usual.  Walking can help get rid of the air that was put into your GI tract during the procedure and reduce the bloating. If you had a lower endoscopy (such as a colonoscopy or flexible sigmoidoscopy) you may notice spotting of blood in your stool or on the toilet paper. If you underwent a bowel prep for your procedure, you may not have a normal bowel movement for a few days.  Please Note:  You might notice some irritation and congestion in your nose or some drainage.  This is from the oxygen used during your procedure.  There is no need for concern and it should clear up in a day or so.  SYMPTOMS TO REPORT IMMEDIATELY:   Following lower endoscopy (colonoscopy or flexible sigmoidoscopy):  Excessive amounts of blood in the stool  Significant tenderness or worsening of abdominal pains  Swelling of the abdomen that is new, acute  Fever of 100F or higher   For urgent or emergent issues, a gastroenterologist can be reached at any hour by calling [phone]  [phone].   DIET:  We do recommend a small meal at first, but then you may proceed to your regular diet.  Drink plenty of fluids but you should avoid alcoholic beverages for 24 hours.  ACTIVITY:  You should plan to take it easy for the rest of today and you should NOT DRIVE or use heavy machinery until tomorrow (because of the sedation medicines used during the test).    FOLLOW UP: Our staff will call the number listed on your records the next business day following your procedure to check on you and address any questions or concerns that you may have regarding the information given to you following your procedure. If we do not reach you, we will leave a message.  However, if you are feeling well and you are not experiencing any problems, there is no need to return our call.  We will assume that you have returned to your regular daily activities without incident.  If any biopsies were taken you will be contacted by phone or by letter within the next 1-3 weeks.  Please call us at [phone] if you have not heard about the biopsies in 3 weeks.    SIGNATURES/CONFIDENTIALITY: You and/or your care partner have signed paperwork which will be entered into your electronic medical record.  These signatures attest to the fact that that the information above on your After Visit Summary has been reviewed and is understood.  Full responsibility of  the confidentiality of this discharge information lies with you and/or your care-partner. 

## 2017-03-03 ENCOUNTER — Telehealth: Payer: Self-pay | Admitting: *Deleted

## 2017-03-03 NOTE — Telephone Encounter (Signed)
No answer, left message to call if questions or concerns. 

## 2017-03-07 ENCOUNTER — Encounter: Payer: Self-pay | Admitting: Internal Medicine

## 2017-03-07 DIAGNOSIS — R911 Solitary pulmonary nodule: Secondary | ICD-10-CM

## 2017-03-07 HISTORY — DX: Solitary pulmonary nodule: R91.1

## 2017-03-07 NOTE — Progress Notes (Signed)
5 adenomas recall 2022 My Chart letter

## 2017-05-26 ENCOUNTER — Emergency Department (HOSPITAL_BASED_OUTPATIENT_CLINIC_OR_DEPARTMENT_OTHER)
Admission: EM | Admit: 2017-05-26 | Discharge: 2017-05-26 | Disposition: A | Discharge status: Home / Self Care | Payer: BLUE CROSS/BLUE SHIELD | Attending: Emergency Medicine | Admitting: Emergency Medicine

## 2017-05-26 ENCOUNTER — Emergency Department (HOSPITAL_BASED_OUTPATIENT_CLINIC_OR_DEPARTMENT_OTHER): Payer: BLUE CROSS/BLUE SHIELD

## 2017-05-26 ENCOUNTER — Encounter (HOSPITAL_BASED_OUTPATIENT_CLINIC_OR_DEPARTMENT_OTHER): Payer: Self-pay

## 2017-05-26 DIAGNOSIS — Z7982 Long term (current) use of aspirin: Secondary | ICD-10-CM | POA: Diagnosis not present

## 2017-05-26 DIAGNOSIS — Z79899 Other long term (current) drug therapy: Secondary | ICD-10-CM | POA: Diagnosis not present

## 2017-05-26 DIAGNOSIS — I2510 Atherosclerotic heart disease of native coronary artery without angina pectoris: Secondary | ICD-10-CM | POA: Diagnosis not present

## 2017-05-26 DIAGNOSIS — R0602 Shortness of breath: Secondary | ICD-10-CM | POA: Diagnosis not present

## 2017-05-26 DIAGNOSIS — J45909 Unspecified asthma, uncomplicated: Secondary | ICD-10-CM | POA: Diagnosis not present

## 2017-05-26 DIAGNOSIS — R05 Cough: Secondary | ICD-10-CM | POA: Insufficient documentation

## 2017-05-26 DIAGNOSIS — R42 Dizziness and giddiness: Secondary | ICD-10-CM | POA: Diagnosis not present

## 2017-05-26 DIAGNOSIS — Z85828 Personal history of other malignant neoplasm of skin: Secondary | ICD-10-CM | POA: Insufficient documentation

## 2017-05-26 DIAGNOSIS — R6884 Jaw pain: Secondary | ICD-10-CM | POA: Diagnosis not present

## 2017-05-26 DIAGNOSIS — R0789 Other chest pain: Secondary | ICD-10-CM

## 2017-05-26 DIAGNOSIS — Z87891 Personal history of nicotine dependence: Secondary | ICD-10-CM | POA: Insufficient documentation

## 2017-05-26 LAB — BASIC METABOLIC PANEL
Anion gap: 8 (ref 5–15)
BUN: 18 mg/dL (ref 6–20)
CALCIUM: 9.3 mg/dL (ref 8.9–10.3)
CHLORIDE: 106 mmol/L (ref 101–111)
CO2: 24 mmol/L (ref 22–32)
CREATININE: 1.09 mg/dL (ref 0.61–1.24)
GFR calc Af Amer: >60 mL/min (ref >60)
GFR calc non Af Amer: >60 mL/min (ref >60)
Glucose, Bld: 103 mg/dL — ABNORMAL HIGH (ref 65–99)
Potassium: 3.8 mmol/L (ref 3.5–5.1)
Sodium: 138 mmol/L (ref 135–145)

## 2017-05-26 LAB — TROPONIN I

## 2017-05-26 LAB — D-DIMER, QUANTITATIVE (NOT AT ARMC): D DIMER QUANT: 0.42 ug{FEU}/mL (ref 0.00–0.50)

## 2017-05-26 LAB — CBC
HEMATOCRIT: 40.5 % (ref 39.0–52.0)
Hemoglobin: 14 g/dL (ref 13.0–17.0)
MCH: 32 pg (ref 26.0–34.0)
MCHC: 34.6 g/dL (ref 30.0–36.0)
MCV: 92.5 fL (ref 78.0–100.0)
PLATELETS: 214 10*3/uL (ref 150–400)
RBC: 4.38 MIL/uL (ref 4.22–5.81)
RDW: 13.2 % (ref 11.5–15.5)
WBC: 8.1 10*3/uL (ref 4.0–10.5)

## 2017-05-26 LAB — BRAIN NATRIURETIC PEPTIDE: B Natriuretic Peptide: 18.1 pg/mL (ref 0.0–100.0)

## 2017-05-26 NOTE — ED Triage Notes (Signed)
Pt c/o lt sided chest pain x3 days, now pain to lt jaw, states some SOB, worse in the evenings

## 2017-05-26 NOTE — ED Provider Notes (Signed)
[facility] EMERGENCY DEPARTMENT Provider Note   CSN: [id number] Arrival date & time: 05/26/17  1434     History   Chief Complaint Chief Complaint  Patient presents with  . Chest Pain    HPI Aaron Stout is a 62 y.o. male presenting for evaluation of chest pain, shortness of breath, and dizziness.  Patient states that since Sunday, he has had left-sided chest pressure/pain, shortness of breath, dizziness.  His pain/pressure is constant, worse with exertion.  He denies history of similar.  No radiation of the pain.  Nothing makes it better or worse.  He has associated shortness of breath, also worse with exertion.  He has a dry cough that began yesterday.  Yesterday he also had radiation of the pain into his left jaw.  No pain with movement of his head.  Patient also describes a dizziness/"wooziness," worse when he stands up or moves his head.  Patient states he was evaluated by cardiology several years ago, found to have some blockages.  No other history of cardiac problems, has not seen cardiologist for 2 yrs.  He has twin older brothers who each had stent placements in their late 18s.  He does not smoke, drink alcohol, denies other drug use.  He denies recent travel, surgery, trauma, immobilization, history of cancer, or history of DVT/PE.  He denies recent fevers, chills, sore throat, nausea, vomiting, abdominal pain, urinary symptoms, normal bowel movements, leg pain or swelling.  He started using Voltaren gel last weekend, as well as getting a shingles shot.  No other change in medications.  HPI  Past Medical History:  Diagnosis Date  . Adenomatous colon polyp 2001  . Allergic rhinitis   . Allergy   . Anxiety   . Arthritis    arthritis-hands  . Asthma   . Bipolar 1 disorder ([place])   . Cancer ([place])    hx skin cancer  . Chronic fatigue syndrome   . Chronic headaches    d/t old neck injury  . Complication of anesthesia    x1 episode "panic attack" awakening-none  in recent years  . Coronary artery disease   . Depression   . Diverticulosis   . Dysrhythmia    was told benign by cardiologist  . GERD (gastroesophageal reflux disease)   . Heart murmur    benign   . History of degenerative disc disease   . HOH (hard of hearing)    slight-bilateral  . Hypercholesterolemia   . IBS (irritable bowel syndrome)   . Internal and external hemorrhoids without complication   . Lyme disease   . PONV (postoperative nausea and vomiting)   . Prostatitis   . Radiculopathy of cervical spine   . Sleep apnea    better s/p UPP-no problems now  . Stiff neck   . Syncope    last episode 1 yr ago-evaluated by cardiologist-negative findings-?related to low B/p (hydration tends to help).  . Testicular hypofunction   . Vitamin D deficiency     Patient Active Problem List   Diagnosis Date Noted  . CAD (coronary artery disease) 10/08/2014  . Internal hemorrhoids with other complication 81/82/9937  . Syncope   . Chest tightness   . Bipolar 1 disorder ([place])   . Sleep apnea   . Hypercholesterolemia   . Personal history of adenomatous colonic polyps 05/18/2007  . Hyperlipidemia 05/18/2007  . DEPRESSION 05/18/2007  . ALLERGIC RHINITIS 05/18/2007  . ASTHMA 05/18/2007  . GASTROESOPHAGEAL REFLUX DISEASE 05/18/2007  .  IRRITABLE BOWEL SYNDROME 05/18/2007  . PROSTATITIS, CHRONIC 05/18/2007  . HEADACHE, CHRONIC 05/18/2007    Past Surgical History:  Procedure Laterality Date  . [place]  . CARDIAC CATHETERIZATION  05/2008   negative  . CHOLECYSTECTOMY N/A 10/08/2014   Procedure: LAPAROSCOPIC CHOLECYSTECTOMY WITH INTRAOPERATIVE CHOLANGIOGRAM;  Surgeon: Erroll Luna, MD;  Location: [facility];  Service: General;  Laterality: N/A;  . COLONOSCOPY    . COLONOSCOPY W/ BIOPSIES  multiple  . ESOPHAGOGASTRODUODENOSCOPY  multiple  . GREEN LIGHT LASER TURP (TRANSURETHRAL RESECTION OF PROSTATE N/A 06/01/2012   Procedure: GREEN LIGHT LASER OF PROSTATE ;  Surgeon:  Ailene Rud, MD;  Location: [facility];  Service: Urology;  Laterality: N/A;  . 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 NASAL POLYP EXCISION    . POLYPECTOMY    . TONSILLECTOMY    . UPPER GASTROINTESTINAL ENDOSCOPY    . UVULOPALATOPHARYNGOPLASTY  1990        Home Medications    Prior to Admission medications   Medication Sig Start Date End Date Taking? Authorizing Provider  AMBULATORY NON FORMULARY MEDICATION LV-GB complex Take 2 tablets by mouth at bettime    [provider]  AMBULATORY NON FORMULARY MEDICATION Serotrex Take 2 tablets at bedtime    [provider]  AMBULATORY NON FORMULARY MEDICATION P5P Take 1 tablet by mouth twice a day    [provider]  amphetamine-dextroamphetamine (ADDERALL XR) 25 MG 24 hr capsule Take by mouth every morning. 02/02/17   [provider]  aspirin 81 MG tablet Take 81 mg by mouth daily.    [provider]  cefdinir (OMNICEF) 300 MG capsule Take 300 mg by mouth. Take 1 tablet by mouth bid on Mon thru Fri for 3 weeks, rest then continue, for suspected bacterial infection.    [provider]  Cholecalciferol (VITAMIN D) 2000 UNITS tablet Take 6,000 Units by mouth daily.     [provider]  Coenzyme Q10 (CO Q-10) 100 MG CAPS Take 100 mg by mouth daily.    [provider]  colestipol (COLESTID) 5 g granules Take 5 g by mouth daily with supper. 02/16/17   [name], MD  Cyanocobalamin (VITAMIN B 12 PO) Take 5,000 mcg by mouth daily.    [provider]  DHEA 25 MG CAPS Take 1 capsule by mouth daily.    [provider]  fexofenadine (ALLEGRA) 180 MG tablet Take 180 mg by mouth every evening.     [provider]  glucosamine-chondroitin 500-400 MG tablet Take 1 tablet by mouth 3 (three) times daily.    [provider]  L-Lysine 1000 MG TABS Take by mouth. Take 2 tabs by mouth at night and 3 in the morning    [provider]  Melatonin 5 MG TBDP Take 5 mg by mouth at bedtime.     [provider]  METHYLCOBALAMIN PO Take 500 mg by mouth at bedtime.    [provider]  Multiple Vitamin (MULTIVITAMIN) tablet Take 1 tablet by mouth daily.    [provider]  Omega-3 Fatty Acids (FISH OIL PO) Take 2,000 mg by mouth 2 (two) times daily.    [provider]  Omeprazole-Sodium Bicarbonate (ZEGERID) 20-1100 MG CAPS capsule Take 1 capsule by mouth 2 (two) times daily.     [provider]  OVER THE COUNTER MEDICATION Take 100 mg by mouth daily. Pregnenolone supplement    [provider]  OVER THE COUNTER MEDICATION Take 400 mg by mouth daily. Sam-e l-methionine LBCore protocol George Hugh, [place]    [provider]  perphenazine (TRILAFON) 2 MG tablet Take 8 mg by mouth at bedtime.     [provider]  Probiotic Product (PROBIOTIC DAILY PO) Take 1 tablet by mouth daily as needed (gut health).     [provider]  Psyllium (FIBER) 0.52 G CAPS Take 8 capsules by mouth every evening.     [provider]  QUEtiapine (SEROQUEL) 25 MG tablet Take 25 mg by mouth at bedtime.  09/01/14   [provider]  Turmeric 500 MG CAPS Take 1 capsule by mouth 2 (two) times daily.    [provider]  valACYclovir (VALTREX) 500 MG tablet Take 500 mg by mouth 2 (two) times daily.     [provider]    Family History Family History  Problem Relation Age of Onset  . Breast cancer Mother 67  . Heart failure Father 62  . Heart disease Unknown        grandfather/uncle  . Alcoholism Unknown        greatgrandfather/uncle    . Colon polyps Unknown        uncle  . Colon cancer Neg Hx   . Esophageal cancer Neg Hx   . Rectal cancer Neg Hx   . Stomach cancer Neg Hx     Social History Social History   Tobacco Use  . Smoking status: Former Smoker    Packs/day: 0.50    Years: 7.00    Pack years: 3.50     Types: Cigarettes    Last attempt to quit: 07/06/1980    Years since quitting: 36.9  . Smokeless tobacco: Never Used  Substance Use Topics  . Alcohol use: No  . Drug use: No     Allergies   Dilaudid [hydromorphone hcl]; Cymbalta [duloxetine hcl]; Ibuprofen; and Penicillins   Review of Systems Review of Systems  Respiratory: Positive for cough, chest tightness and shortness of breath.   Cardiovascular: Positive for chest pain.  Neurological: Positive for dizziness.  All other systems reviewed and are negative.    Physical Exam Updated Vital Signs BP (!) 147/89   Pulse 81   Temp 98.1 F (36.7 C) (Oral)   Resp (!) 23   SpO2 95%   Physical Exam  Constitutional: He is oriented to person, place, and time. He appears well-developed and well-nourished. No distress.  Resting comfortably in NAD  HENT:  Head: Normocephalic and atraumatic.  Eyes: Pupils are equal, round, and reactive to light. Conjunctivae and EOM are normal.  Neck: Normal range of motion. Neck supple.  Cardiovascular: Normal rate, regular rhythm and intact distal pulses.  Radial and pedal pulses intact bilaterally.   Pulmonary/Chest: Effort normal and breath sounds normal. No respiratory distress. He has no wheezes. He exhibits no tenderness.  Abdominal: Soft. He exhibits no distension and no mass. There is no tenderness. There is no guarding.  Musculoskeletal: Normal range of motion. He exhibits edema. He exhibits no tenderness.  No leg pain. 1+ pitting edema bilaterally.   Neurological: He is alert and oriented to person, place, and time. No sensory deficit.  Skin: Skin is warm and dry.  Psychiatric: He has a normal mood and affect.  Nursing note and vitals reviewed.    ED Treatments / Results  Labs (all labs ordered are listed, but only abnormal results are displayed) Labs Reviewed  BASIC METABOLIC PANEL -  Abnormal; Notable for the following components:      Result Value   Glucose, Bld 103 (*)    All  other components within normal limits  TROPONIN I  CBC  BRAIN NATRIURETIC PEPTIDE  D-DIMER, QUANTITATIVE (NOT AT [facility])    EKG EKG Interpretation  Date/Time:  Friday May 26 2017 14:46:14 EDT Ventricular Rate:  82 PR Interval:    QRS Duration: 102 QT Interval:  381 QTC Calculation: 445 R Axis:   -60 Text Interpretation:  Sinus rhythm Left anterior fascicular block Abnormal R-wave progression, early transition No significant change since last tracing Confirmed by Isla Pence [phone]) on 05/26/2017 3:01:17 PM   Radiology Dg Chest Portable 1 View  Result Date: 05/26/2017 CLINICAL DATA:  Shortness of breath EXAM: PORTABLE CHEST 1 VIEW COMPARISON:  None. FINDINGS: The heart size and mediastinal contours are within normal limits. Both lungs are clear. The visualized skeletal structures are unremarkable. IMPRESSION: No active disease. Electronically Signed   By: Ulyses Jarred M.D.   On: 05/26/2017 15:30    Procedures Procedures (including critical care time)  Medications Ordered in ED Medications - No data to display   Initial Impression / Assessment and Plan / ED Course  I have reviewed the triage vital signs and the nursing notes.  Pertinent labs & imaging results that were available during my care of the patient were reviewed by me and considered in my medical decision making (see chart for details).     Pt presenting for evaluation of CP, SOB, and dizziness. Physical exam reassuring, he appears in no distress. Will start cardiac workup, as pt has signficnat risk factors for ACS, and sxs concerning for PE. Case discussed with attending, Dr. Tyrone Nine agrees to plan.   EKG without signs of stemi. Troponin negative. Labs reassuring. BNP reassuring. CXR viewed and interpreted by me, no signs of PNA or fluid. HEART score 4. Discussed with pt that he should be observed and have serial troponin, but pt states he does not want to wait longer. Discussed that he is at risk for a major  cardiac event. Pt states he understands, but he would like to leave and f/u with his cardiologist outpatient. Strict return precautions given. Pt state he understands and agrees to plan.    Final Clinical Impressions(s) / ED Diagnoses   Final diagnoses:  Atypical chest pain    ED Discharge Orders    None       Franchot Heidelberg, PA-C 05/26/17 [place], [place], DO 05/26/17 2329

## 2017-05-26 NOTE — Discharge Instructions (Signed)
Continue taking your at home medications as prescribed.  Stop using Voltaren gel.  Follow up with your cardiologist next week. There is information provided below for cardiology if you need.  Return to the ER if you develop change in your chest pain, new concerns, or any new or worrying symptoms.

## 2017-06-02 ENCOUNTER — Telehealth: Payer: Self-pay | Admitting: *Deleted

## 2017-06-02 NOTE — Telephone Encounter (Signed)
Referral fax from  Reginold Agent NP of [facility] sent to scheduling [phone]  [phone]

## 2017-06-14 DIAGNOSIS — I4510 Unspecified right bundle-branch block: Secondary | ICD-10-CM | POA: Insufficient documentation

## 2017-06-14 NOTE — Progress Notes (Signed)
Cardiology Office Note:    Date:  06/15/2017   ID:  Aaron Stout, DOB 05/02/1955, MRN [id number]  PCP:  Marton Redwood, MD  Cardiologist:  Shirlee More, MD   Referring MD: [facility]  ASSESSMENT:    1. Mild CAD   2. RBBB   3. Hypercholesterolemia   4. Essential hypertension   5. Palpitation    PLAN:    In order of problems listed above:  1. He has typical symptoms of exertional angina that have worsened and I would typify his class II.  For further evaluation I asked him to undergo cardiac CTA.  If he has evidence of severe flow-limiting stenosis he would benefit from revascularization.  This will be scheduled will follow-up with me in the office afterwards.  I am unsure what role stress is playing in his symptoms. 2. Noted on recent EKG 3. Continue current treatment if he has significant CAD he would benefit from statin 4. Untreated I will start him on an ARB and asked him to check his blood pressure 5 minutes of rest same time each day and record.  If not on target a beta-blocker would be appropriate 5. Symptoms are not severe and at this time I would not pursue an arrhythmia evaluation.  We will reassess at his next visit  Next appointment 4 weeks    Medication Adjustments/Labs and Tests Ordered: Current medicines are reviewed at length with the patient today.  Concerns regarding medicines are outlined above.  Orders Placed This Encounter  Procedures  . CT CORONARY MORPH W/CTA COR W/SCORE W/CA W/CM &/OR WO/CM  . CT CORONARY FRACTIONAL FLOW RESERVE DATA PREP  . CT CORONARY FRACTIONAL FLOW RESERVE FLUID ANALYSIS   Meds ordered this encounter  Medications  . telmisartan (MICARDIS) 40 MG tablet    Sig: Take 1 tablet (40 mg total) by mouth daily.    Dispense:  30 tablet    Refill:  11  . metoprolol tartrate (LOPRESSOR) 50 MG tablet    Sig: Take 1 tablet (50 mg total) by mouth once for 1 dose. Take 1 tablet 1 hour before cardiac CTA.    Dispense:  180 tablet     Refill:  3     Chief Complaint  Patient presents with  . New Patient (Initial Visit)    per Dr Brigitte Pulse to evaulate CP  . Chest Pain  . Hypertension    History of Present Illness:    Aaron Stout is a 62 y.o. male with mild CAD last seen in August 2016 who is being seen today for the evaluation of chest pain at the request of Pa, [facility]*. Problem List: 10/08/14 1. Syncope 2. Mild to moderate coronary artery disease by heart catheterization ( 2010) 3. Bipolar disease 4. Sleep apnea 5. Hyperlipidemia-    MPI 04/11/12: Notes Recorded by Thayer Headings, MD on 04/16/2012 at 8:01 AM The myoview shows no evidence of ischemia. There is a question of mild LV dysfunction on the myoview. We have seen this before in him. I have reviewed the notes from his cardiac cath ( which was done to answer this same question in 2010).    He prefaces by telling me he is under intense stress with his occupation.  He is much more aware of his heart having palpitation at rest not severe sustained and has developed a slow gait some exercise intolerance and exertional shortness of breath when he does activities like walking into work associated  with chest tightness.  This is been present for over a year but is worsened recently he has not had rest or nocturnal symptoms.  He is also noted home blood pressures in the range of 188-416 systolic he has known mild CAD but hypertension is new and has not been treated.  All this is caused him to be quite apprehensive and he seeks medical attention.  He recently was seen in the emergency room and had no evidence of acute coronary syndrome.  I reviewed those records in the previous cardiology records prior to this visit. Past Medical History:  Diagnosis Date  . Adenomatous colon polyp 2001  . Allergic rhinitis   . ALLERGIC RHINITIS 05/18/2007   Qualifier: Diagnosis of  By: Ronnald Ramp RN, CGRN, Sheri    . Allergy   . Anxiety   . Arthritis    arthritis-hands   . Asthma   . ASTHMA 05/18/2007   Qualifier: Diagnosis of  By: Ronnald Ramp RN, CGRN, Sheri    . Bipolar 1 disorder ([place])   . CAD (coronary artery disease) 10/08/2014  . Cancer ([place])    hx skin cancer  . Chest tightness   . Chronic fatigue syndrome   . Chronic headaches    d/t old neck injury  . Complication of anesthesia    x1 episode "panic attack" awakening-none in recent years  . Coronary artery disease   . Depression   . DEPRESSION 05/18/2007   Qualifier: Diagnosis of  By: Ronnald Ramp RN, CGRN, Sheri    . Diverticulosis   . Dysrhythmia    was told benign by cardiologist  . GASTROESOPHAGEAL REFLUX DISEASE 05/18/2007   Qualifier: Diagnosis of  By: Ronnald Ramp RN, [place], [place]    . GERD (gastroesophageal reflux disease)   . HEADACHE, CHRONIC 05/18/2007   Qualifier: Diagnosis of  By: Ronnald Ramp RN, CGRN, Sheri    . Heart murmur    benign   . History of degenerative disc disease   . HOH (hard of hearing)    slight-bilateral  . Hypercholesterolemia   . Hyperlipidemia 05/18/2007   Qualifier: Diagnosis of  By: Ronnald Ramp RN, CGRN, Sheri    . IBS (irritable bowel syndrome)   . Internal and external hemorrhoids without complication   . Internal hemorrhoids with other complication 6/0/6301  . Irritable bowel syndrome 05/18/2007   Qualifier: Diagnosis of  By: Ronnald Ramp RN, CGRN, Sheri    . Lyme disease   . Personal history of adenomatous colonic polyps 05/18/2007   2001, 2004, 2006, 2009 (last with one small adenoma) Max 4 polyps in past with largest 7 mm (both in 2004)  09/11/2013 7 small polyps adenomas  02/2017 5 adeoimas max 10 mm repeat colon 03/2020    . PONV (postoperative nausea and vomiting)   . Prostatitis   . PROSTATITIS, CHRONIC 05/18/2007   Qualifier: Diagnosis of  By: Ronnald Ramp RN, CGRN, Sheri    . Radiculopathy of cervical spine   . Sleep apnea    better s/p UPP-no problems now  . Stiff neck   . Syncope    last episode 1 yr ago-evaluated by cardiologist-negative findings-?related to low B/p (hydration tends  to help).  . Testicular hypofunction   . Vitamin D deficiency     Past Surgical History:  Procedure Laterality Date  . [place]  . CARDIAC CATHETERIZATION  05/2008   negative  . CHOLECYSTECTOMY N/A 10/08/2014   Procedure: LAPAROSCOPIC CHOLECYSTECTOMY WITH INTRAOPERATIVE CHOLANGIOGRAM;  Surgeon: Erroll Luna, MD;  Location: [place];  Service: General;  Laterality: N/A;  . COLONOSCOPY    . COLONOSCOPY W/ BIOPSIES  multiple  . ESOPHAGOGASTRODUODENOSCOPY  multiple  . GREEN LIGHT LASER TURP (TRANSURETHRAL RESECTION OF PROSTATE N/A 06/01/2012   Procedure: GREEN LIGHT LASER OF PROSTATE ;  Surgeon: Ailene Rud, MD;  Location: [facility];  Service: Urology;  Laterality: N/A;  . 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 NASAL POLYP EXCISION    . POLYPECTOMY    . TONSILLECTOMY    . UPPER GASTROINTESTINAL ENDOSCOPY    . UVULOPALATOPHARYNGOPLASTY  1990    Current Medications: Current Meds  Medication Sig  . AMBULATORY NON FORMULARY MEDICATION LV-GB complex Take 2 tablets by mouth at bettime  . AMBULATORY NON FORMULARY MEDICATION Serotrex Take 2 tablets at bedtime  . AMBULATORY NON FORMULARY MEDICATION P5P Take 1 tablet by mouth twice a day  . amphetamine-dextroamphetamine (ADDERALL XR) 20 MG 24 hr capsule Take 40 mg by mouth every morning.   Marland Kitchen aspirin 81 MG tablet Take 81 mg by mouth daily.  . Cholecalciferol (VITAMIN D) 2000 UNITS tablet Take 6,000 Units by mouth daily.   . Coenzyme Q10 (CO Q-10) 100 MG CAPS Take 100 mg by mouth daily.  . colestipol (COLESTID) 5 g granules Take 5 g by mouth daily with supper. (Patient taking differently: Take 7.5 g by mouth daily with supper. )  . Cyanocobalamin (VITAMIN B 12 PO) Take 5,000 mcg by mouth daily.  Marland Kitchen DHEA 25 MG CAPS Take 1 capsule by mouth daily.  . fexofenadine (ALLEGRA) 180 MG tablet Take 180 mg by mouth every evening.   Marland Kitchen glucosamine-chondroitin 500-400 MG tablet Take 1 tablet by mouth daily.   Marland Kitchen L-Lysine  1000 MG TABS Take by mouth. Take 1 tablet BID  . Melatonin 10 MG TABS Take 10 mg by mouth at bedtime.   . Multiple Vitamin (MULTIVITAMIN) tablet Take 1 tablet by mouth daily.  . Omega-3 Fatty Acids (FISH OIL PO) Take 2,000 mg by mouth 2 (two) times daily.  Earney Navy Bicarbonate (ZEGERID) 20-1100 MG CAPS capsule Take 1 capsule by mouth 2 (two) times daily.   Marland Kitchen OVER THE COUNTER MEDICATION Take 100 mg by mouth daily. Pregnenolone supplement  . OVER THE COUNTER MEDICATION Take 400 mg by mouth daily. Sam-e l-methionine LBCore protocol George Hugh, [place]  . Probiotic Product (PROBIOTIC DAILY PO) Take 1 tablet by mouth daily as needed (gut health).   . Psyllium (FIBER) 0.52 G CAPS Take 8 capsules by mouth every evening.   Marland Kitchen QUEtiapine (SEROQUEL) 25 MG tablet Take 25 mg by mouth 3 (three) times daily.   . Turmeric 500 MG CAPS Take 1 capsule by mouth 2 (two) times daily.  . valACYclovir (VALTREX) 500 MG tablet Take 500 mg by mouth 2 (two) times daily.      Allergies:   Dilaudid [hydromorphone hcl]; Cymbalta [duloxetine hcl]; Ibuprofen; and Penicillins   Social History   Socioeconomic History  . Marital status: Married    Spouse name: Not on file  . Number of children: 2  . Years of education: Not on file  . Highest education level: Not on file  Occupational History  . Occupation: Designer, jewellery: [employer]  Social Needs  . Financial resource strain: Not on file  . Food insecurity:    Worry: Not on file    Inability: Not on file  . Transportation needs:    Medical: Not on file    Non-medical: Not  on file  Tobacco Use  . Smoking status: Former Smoker    Packs/day: 0.50    Years: 7.00    Pack years: 3.50    Types: Cigarettes    Last attempt to quit: 07/06/1980    Years since quitting: 36.9  . Smokeless tobacco: Never Used  Substance and Sexual Activity  . Alcohol use: No  . Drug use: No  . Sexual activity: Yes  Lifestyle  . Physical activity:     Days per week: Not on file    Minutes per session: Not on file  . Stress: Not on file  Relationships  . Social connections:    Talks on phone: Not on file    Gets together: Not on file    Attends religious service: Not on file    Active member of club or organization: Not on file    Attends meetings of clubs or organizations: Not on file    Relationship status: Not on file  Other Topics Concern  . Not on file  Social History Narrative  . Not on file     Family History: The patient's family history includes Alcoholism in his unknown relative; Breast cancer (age of onset: 8) in his mother; CAD in his brother and brother; Colon polyps in his unknown relative; Heart disease in his unknown relative; Heart failure (age of onset: 60) in his father. There is no history of Colon cancer, Esophageal cancer, Rectal cancer, or Stomach cancer.  ROS:   ROS Please see the history of present illness.     All other systems reviewed and are negative.  EKGs/Labs/Other Studies Reviewed:    The following studies were reviewed today:  Recent Labs: 05/26/2017: B Natriuretic Peptide 18.1; BUN 18; Creatinine, Ser 1.09; Hemoglobin 14.0; Platelets 214; Potassium 3.8; Sodium 138  Recent Lipid Panel    Component Value Date/Time   CHOL 237 (H) 07/18/2011 1506   TRIG 124.0 07/18/2011 1506   HDL 50.70 07/18/2011 1506   CHOLHDL 5 07/18/2011 1506   VLDL 24.8 07/18/2011 1506   LDLDIRECT 180.6 07/18/2011 1506    Physical Exam:    VS:  BP (!) 144/88 (BP Location: Left Arm, Patient Position: Sitting, Cuff Size: Large)   Pulse 89   Ht 6\' 1"  (1.854 m)   Wt 237 lb 1.9 oz (107.6 kg)   SpO2 98%   BMI 31.28 kg/m     Wt Readings from Last 3 Encounters:  06/15/17 237 lb 1.9 oz (107.6 kg)  03/02/17 241 lb (109.3 kg)  02/16/17 241 lb (109.3 kg)     GEN:  Well nourished, well developed in no acute distress HEENT: Normal NECK: No JVD; No carotid bruits LYMPHATICS: No lymphadenopathy CARDIAC: RRR, no  murmurs, rubs, gallops RESPIRATORY:  Clear to auscultation without rales, wheezing or rhonchi  ABDOMEN: Soft, non-tender, non-distended MUSCULOSKELETAL:  No edema; No deformity  SKIN: Warm and dry NEUROLOGIC:  Alert and oriented x 3 PSYCHIATRIC:  Normal affect     Signed, Shirlee More, MD  06/15/2017 2:48 PM    [place]

## 2017-06-15 ENCOUNTER — Ambulatory Visit: Payer: BLUE CROSS/BLUE SHIELD | Admitting: Cardiology

## 2017-06-15 ENCOUNTER — Encounter: Payer: Self-pay | Admitting: Cardiology

## 2017-06-15 VITALS — BP 144/88 | HR 89 | Ht 73.0 in | Wt 237.1 lb

## 2017-06-15 DIAGNOSIS — I2510 Atherosclerotic heart disease of native coronary artery without angina pectoris: Secondary | ICD-10-CM | POA: Diagnosis not present

## 2017-06-15 DIAGNOSIS — E7800 Pure hypercholesterolemia, unspecified: Secondary | ICD-10-CM

## 2017-06-15 DIAGNOSIS — R002 Palpitations: Secondary | ICD-10-CM | POA: Insufficient documentation

## 2017-06-15 DIAGNOSIS — I4510 Unspecified right bundle-branch block: Secondary | ICD-10-CM

## 2017-06-15 DIAGNOSIS — I10 Essential (primary) hypertension: Secondary | ICD-10-CM | POA: Insufficient documentation

## 2017-06-15 MED ORDER — TELMISARTAN 40 MG PO TABS: 40.0000 mg | ORAL_TABLET | Freq: Every day | ORAL | 11 refills | Status: DC

## 2017-06-15 MED ORDER — METOPROLOL TARTRATE 50 MG PO TABS: 50.0000 mg | ORAL_TABLET | Freq: Once | ORAL | 3 refills | Status: DC

## 2017-06-15 NOTE — Patient Instructions (Addendum)
Medication Instructions:  Your physician has recommended you make the following change in your medication:  START telmisartan (Micardis) 40 mg daily  Labwork: None  Testing/Procedures: Your physician has requested that you have cardiac CT. Cardiac computed tomography (CT) is a painless test that uses an x-ray machine to take clear, detailed pictures of your heart. For further information please visit [email or web address]. Please follow instruction sheet as given.  Please arrive at the [facility] main entrance of [facility] at xx:xx AM (30-45 minutes prior to test start time)  [facility] [place],  [address] [phone]  Proceed to the [facility] Radiology Department (First Floor).  Please follow these instructions carefully (unless otherwise directed):  Hold all erectile dysfunction medications at least 48 hours prior to test.  On the Night Before the Test: . Drink plenty of water. . Do not consume any caffeinated/decaffeinated beverages or chocolate 12 hours prior to your test. . Do not take any antihistamines 12 hours prior to your test.  On the Day of the Test: . Drink plenty of water. Do not drink any water within one hour of the test. . Do not eat any food 4 hours prior to the test. . You may take your regular medications prior to the test. . IF NOT ON A BETA BLOCKER - Take 50 mg of lopressor (metoprolol) one hour before the test.  After the Test: . Drink plenty of water. . After receiving IV contrast, you may experience a mild flushed feeling. This is normal. . On occasion, you may experience a mild rash up to 24 hours after the test. This is not dangerous. If this occurs, you can take Benadryl 25 mg and increase your fluid intake. . If you experience trouble breathing, this can be serious. If it is severe call 911 IMMEDIATELY. If it is mild, please call our office.  Follow-Up: Your physician recommends that you schedule a  follow-up appointment in: 4 weeks.  Any Other Special Instructions Will Be Listed Below (If Applicable).     If you need a refill on your cardiac medications before your next appointment, please call your pharmacy.    Hypertension Hypertension is another name for high blood pressure. High blood pressure forces your heart to work harder to pump blood. This can cause problems over time. There are two numbers in a blood pressure reading. There is a top number (systolic) over a bottom number (diastolic). It is best to have a blood pressure below 120/80. Healthy choices can help lower your blood pressure. You may need medicine to help lower your blood pressure if:  Your blood pressure cannot be lowered with healthy choices.  Your blood pressure is higher than 130/80.  Follow these instructions at home: Eating and drinking  If directed, follow the DASH eating plan. This diet includes: ? Filling half of your plate at each meal with fruits and vegetables. ? Filling one quarter of your plate at each meal with whole grains. Whole grains include whole wheat pasta, brown rice, and whole grain bread. ? Eating or drinking low-fat dairy products, such as skim milk or low-fat yogurt. ? Filling one quarter of your plate at each meal with low-fat (lean) proteins. Low-fat proteins include fish, skinless chicken, eggs, beans, and tofu. ? Avoiding fatty meat, cured and processed meat, or chicken with skin. ? Avoiding premade or processed food.  Eat less than 1,500 mg of salt (sodium) a day.  Limit alcohol use to no more  than 1 drink a day for nonpregnant women and 2 drinks a day for men. One drink equals 12 oz of beer, 5 oz of wine, or 1 oz of hard liquor. Lifestyle  Work with your doctor to stay at a healthy weight or to lose weight. Ask your doctor what the best weight is for you.  Get at least 30 minutes of exercise that causes your heart to beat faster (aerobic exercise) most days of the week.  This may include walking, swimming, or biking.  Get at least 30 minutes of exercise that strengthens your muscles (resistance exercise) at least 3 days a week. This may include lifting weights or pilates.  Do not use any products that contain nicotine or tobacco. This includes cigarettes and e-cigarettes. If you need help quitting, ask your doctor.  Check your blood pressure at home as told by your doctor.  Keep all follow-up visits as told by your doctor. This is important. Medicines  Take over-the-counter and prescription medicines only as told by your doctor. Follow directions carefully.  Do not skip doses of blood pressure medicine. The medicine does not work as well if you skip doses. Skipping doses also puts you at risk for problems.  Ask your doctor about side effects or reactions to medicines that you should watch for. Contact a doctor if:  You think you are having a reaction to the medicine you are taking.  You have headaches that keep coming back (recurring).  You feel dizzy.  You have swelling in your ankles.  You have trouble with your vision. Get help right away if:  You get a very bad headache.  You start to feel confused.  You feel weak or numb.  You feel faint.  You get very bad pain in your: ? Chest. ? Belly (abdomen).  You throw up (vomit) more than once.  You have trouble breathing. Summary  Hypertension is another name for high blood pressure.  Making healthy choices can help lower blood pressure. If your blood pressure cannot be controlled with healthy choices, you may need to take medicine. This information is not intended to replace advice given to you by your health care provider. Make sure you discuss any questions you have with your health care provider. Document Released: 08/10/2007 Document Revised: 01/20/2016 Document Reviewed: 01/20/2016 Elsevier Interactive Patient Education  2018 [place]: Healthy eating to lower  your blood pressure The DASH diet emphasizes portion size, eating a variety of foods and getting the right amount of nutrients. Discover how DASH can improve your health and lower your blood pressure. By [facility] Staff  DASH stands for Dietary Approaches to Stop Hypertension. The DASH diet is a lifelong approach to healthy eating that's designed to help treat or prevent high blood pressure (hypertension). The DASH diet encourages you to reduce the sodium in your diet and eat a variety of foods rich in nutrients that help lower blood pressure, such as potassium, calcium and magnesium. By following the DASH diet, you may be able to reduce your blood pressure by a few points in just two weeks. Over time, your systolic blood pressure could drop by eight to 14 points, which can make a significant difference in your health risks. Because the DASH diet is a healthy way of eating, it offers health benefits besides just lowering blood pressure. The DASH diet is also in line with dietary recommendations to prevent osteoporosis, cancer, heart disease, stroke and diabetes. DASH diet: Sodium levels The  DASH diet emphasizes vegetables, fruits and low-fat dairy foods - and moderate amounts of whole grains, fish, poultry and nuts. In addition to the standard DASH diet, there is also a lower sodium version of the diet. You can choose the version of the diet that meets your health needs: Standard DASH diet. You can consume up to 2,300 milligrams (mg) of sodium a day.  Lower sodium DASH diet. You can consume up to 1,500 mg of sodium a day. Both versions of the DASH diet aim to reduce the amount of sodium in your diet compared with what you might get in a typical American diet, which can amount to a whopping 3,400 mg of sodium a day or more. The standard DASH diet meets the recommendation from the Dietary Guidelines for Americans to keep daily sodium intake to less than 2,300 mg a day. The American Heart Association  recommends 1,500 mg a day of sodium as an upper limit for all adults. If you aren't sure what sodium level is right for you, talk to your doctor. DASH diet: What to eat Both versions of the DASH diet include lots of whole grains, fruits, vegetables and low-fat dairy products. The DASH diet also includes some fish, poultry and legumes, and encourages a small amount of nuts and seeds a few times a week.  You can eat red meat, sweets and fats in small amounts. The DASH diet is low in saturated fat, cholesterol and total fat. Here's a look at the recommended servings from each food group for the 2,000-calorie-a-day DASH diet. Grains: 6 to 8 servings a day Grains include bread, cereal, rice and pasta. Examples of one serving of grains include 1 slice whole-wheat bread, 1 ounce dry cereal, or 1/2 cup cooked cereal, rice or pasta. Focus on whole grains because they have more fiber and nutrients than do refined grains. For instance, use brown rice instead of white rice, whole-wheat pasta instead of regular pasta and whole-grain bread instead of white bread. Look for products labeled "100 percent whole grain" or "100 percent whole wheat."  Grains are naturally low in fat. Keep them this way by avoiding butter, cream and cheese sauces. Vegetables: 4 to 5 servings a day Tomatoes, carrots, broccoli, sweet potatoes, greens and other vegetables are full of fiber, vitamins, and such minerals as potassium and magnesium. Examples of one serving include 1 cup raw leafy green vegetables or 1/2 cup cut-up raw or cooked vegetables. Don't think of vegetables only as side dishes - a hearty blend of vegetables served over brown rice or whole-wheat noodles can serve as the main dish for a meal.  Fresh and frozen vegetables are both good choices. When buying frozen and canned vegetables, choose those labeled as low sodium or without added salt.  To increase the number of servings you fit in daily, be creative. In a stir-fry, for  instance, cut the amount of meat in half and double up on the vegetables. Fruits: 4 to 5 servings a day Many fruits need little preparation to become a healthy part of a meal or snack. Like vegetables, they're packed with fiber, potassium and magnesium and are typically low in fat - coconuts are an exception. Examples of one serving include one medium fruit, 1/2 cup fresh, frozen or canned fruit, or 4 ounces of juice. Have a piece of fruit with meals and one as a snack, then round out your day with a dessert of fresh fruits topped with a dollop of low-fat yogurt.  Leave  on edible peels whenever possible. The peels of apples, pears and most fruits with pits add interesting texture to recipes and contain healthy nutrients and fiber.  Remember that citrus fruits and juices, such as grapefruit, can interact with certain medications, so check with your doctor or pharmacist to see if they're OK for you.  If you choose canned fruit or juice, make sure no sugar is added. Dairy: 2 to 3 servings a day Milk, yogurt, cheese and other dairy products are major sources of calcium, vitamin D and protein. But the key is to make sure that you choose dairy products that are low fat or fat-free because otherwise they can be a major source of fat - and most of it is saturated. Examples of one serving include 1 cup skim or 1 percent milk, 1 cup low fat yogurt, or 1 1/2 ounces part-skim cheese. Low-fat or fat-free frozen yogurt can help you boost the amount of dairy products you eat while offering a sweet treat. Add fruit for a healthy twist.  If you have trouble digesting dairy products, choose lactose-free products or consider taking an over-the-counter product that contains the enzyme lactase, which can reduce or prevent the symptoms of lactose intolerance.  Go easy on regular and even fat-free cheeses because they are typically high in sodium. Lean meat, poultry and fish: 6 servings or fewer a day Meat can be a rich  source of protein, B vitamins, iron and zinc. Choose lean varieties and aim for no more than 6 ounces a day. Cutting back on your meat portion will allow room for more vegetables. Trim away skin and fat from poultry and meat and then bake, broil, grill or roast instead of frying in fat.  Eat heart-healthy fish, such as salmon, herring and tuna. These types of fish are high in omega-3 fatty acids, which can help lower your total cholesterol. Nuts, seeds and legumes: 4 to 5 servings a week Almonds, sunflower seeds, kidney beans, peas, lentils and other foods in this family are good sources of magnesium, potassium and protein. They're also full of fiber and phytochemicals, which are plant compounds that may protect against some cancers and cardiovascular disease. Serving sizes are small and are intended to be consumed only a few times a week because these foods are high in calories. Examples of one serving include 1/3 cup nuts, 2 tablespoons seeds, or 1/2 cup cooked beans or peas.  Nuts sometimes get a bad rap because of their fat content, but they contain healthy types of fat - monounsaturated fat and omega-3 fatty acids. They're high in calories, however, so eat them in moderation. Try adding them to stir-fries, salads or cereals.  Soybean-based products, such as tofu and tempeh, can be a good alternative to meat because they contain all of the amino acids your body needs to make a complete protein, just like meat. Fats and oils: 2 to 3 servings a day Fat helps your body absorb essential vitamins and helps your body's immune system. But too much fat increases your risk of heart disease, diabetes and obesity. The DASH diet strives for a healthy balance by limiting total fat to less than 30 percent of daily calories from fat, with a focus on the healthier monounsaturated fats. Examples of one serving include 1 teaspoon soft margarine, 1 tablespoon mayonnaise or 2 tablespoons salad dressing. Saturated fat and  trans fat are the main dietary culprits in increasing your risk of coronary artery disease. DASH helps keep your daily saturated  fat to less than 6 percent of your total calories by limiting use of meat, butter, cheese, whole milk, cream and eggs in your diet, along with foods made from lard, solid shortenings, and palm and coconut oils.  Avoid trans fat, commonly found in such processed foods as crackers, baked goods and fried items.  Read food labels on margarine and salad dressing so that you can choose those that are lowest in saturated fat and free of trans fat. Sweets: 5 servings or fewer a week You don't have to banish sweets entirely while following the DASH diet - just go easy on them. Examples of one serving include 1 tablespoon sugar, jelly or jam, 1/2 cup sorbet, or 1 cup lemonade. When you eat sweets, choose those that are fat-free or low-fat, such as sorbets, fruit ices, jelly beans, hard candy, graham crackers or low-fat cookies.  Artificial sweeteners such as aspartame (NutraSweet, Equal) and sucralose (Splenda) may help satisfy your sweet tooth while sparing the sugar. But remember that you still must use them sensibly. It's OK to swap a diet cola for a regular cola, but not in place of a more nutritious beverage such as low-fat milk or even plain water.  Cut back on added sugar, which has no nutritional value but can pack on calories. DASH diet: Alcohol and caffeine Drinking too much alcohol can increase blood pressure. The Dietary Guidelines for Americans recommends that men limit alcohol to no more than two drinks a day and women to one or less. The DASH diet doesn't address caffeine consumption. The influence of caffeine on blood pressure remains unclear. But caffeine can cause your blood pressure to rise at least temporarily. If you already have high blood pressure or if you think caffeine is affecting your blood pressure, talk to your doctor about your caffeine consumption. DASH  diet and weight loss While the DASH diet is not a weight-loss program, you may indeed lose unwanted pounds because it can help guide you toward healthier food choices. The DASH diet generally includes about 2,000 calories a day. If you're trying to lose weight, you may need to eat fewer calories. You may also need to adjust your serving goals based on your individual circumstances - something your health care team can help you decide. Tips to cut back on sodium The foods at the core of the DASH diet are naturally low in sodium. So just by following the DASH diet, you're likely to reduce your sodium intake. You also reduce sodium further by: Using sodium-free spices or flavorings with your food instead of salt  Not adding salt when cooking rice, pasta or hot cereal  Rinsing canned foods to remove some of the sodium  Buying foods labeled "no salt added," "sodium-free," "low sodium" or "very low sodium" One teaspoon of table salt has 2,325 mg of sodium. When you read food labels, you may be surprised at just how much sodium some processed foods contain. Even low-fat soups, canned vegetables, ready-to-eat cereals and sliced [place] from the local deli - foods you may have considered healthy - often have lots of sodium. You may notice a difference in taste when you choose low-sodium food and beverages. If things seem too bland, gradually introduce low-sodium foods and cut back on table salt until you reach your sodium goal. That'll give your palate time to adjust. Using salt-free seasoning blends or herbs and spices may also ease the transition. It can take several weeks for your taste buds to get used to  less salty foods. Putting the pieces of the DASH diet together Try these strategies to get started on the DASH diet:  Change gradually. If you now eat only one or two servings of fruits or vegetables a day, try to add a serving at lunch and one at dinner. Rather than switching to all whole grains, start by  making one or two of your grain servings whole grains. Increasing fruits, vegetables and whole grains gradually can also help prevent bloating or diarrhea that may occur if you aren't used to eating a diet with lots of fiber. You can also try over-the-counter products to help reduce gas from beans and vegetables.  Reward successes and forgive slip-ups. Reward yourself with a nonfood treat for your accomplishments - rent a movie, purchase a book or get together with a friend. Everyone slips, especially when learning something new. Remember that changing your lifestyle is a long-term process. Find out what triggered your setback and then just pick up where you left off with the DASH diet.  Add physical activity. To boost your blood pressure lowering efforts even more, consider increasing your physical activity in addition to following the DASH diet. Combining both the DASH diet and physical activity makes it more likely that you'll reduce your blood pressure.  Get support if you need it. If you're having trouble sticking to your diet, talk to your doctor or dietitian about it. You might get some tips that will help you stick to the DASH diet. Remember, healthy eating isn't an all-or-nothing proposition. What's most important is that, on average, you eat healthier foods with plenty of variety - both to keep your diet nutritious and to avoid boredom or extremes. And with the DASH diet, you can have both.

## 2017-06-23 ENCOUNTER — Telehealth: Payer: Self-pay | Admitting: Cardiology

## 2017-06-23 NOTE — Telephone Encounter (Signed)
Pt c/o medication issue:  1. Name of Medication:   telmisartan (MICARDIS) 40 MG tablet    2. How are you currently taking this medication (dosage and times per day)? Take 1 tablet (40 mg total) by mouth daily.  3. Are you having a reaction (difficulty breathing--STAT)? no  4. What is your medication issue? Pt took a half and pt has been very dizzy

## 2017-06-26 ENCOUNTER — Telehealth: Payer: Self-pay | Admitting: Cardiology

## 2017-06-26 NOTE — Telephone Encounter (Signed)
Patient advised to continue 10 mg dose for the next few days. Patient advised to check blood pressure daily at the same time every day sitting, then standing. Patient will return call if drops more than 20 points from sitting to standing within 2 days. Otherwise patient will call Friday with results of blood pressures. Patient verbalized understanding, no further questions.

## 2017-06-26 NOTE — Telephone Encounter (Signed)
[phone] patient states these blood pressures are better for him.  Patient is also fatigued more than usual.

## 2017-06-26 NOTE — Telephone Encounter (Signed)
Patient was prescribed telmisartan 40 mg daily. Patient began to feel dizzy when taking the medication. Patient cut dose in half to 20 mg daily. Patient was still dizzy. Over the weekend patient cut dose into a quarter and was taking 10 mg, still dizzy, but not as bad. Patient had a dry cough prior to taking medication which got worse with taking the medication. Patient had no other symptoms. No chest pain, shortness of breath, palpitations. Please advise.

## 2017-06-26 NOTE — Telephone Encounter (Signed)
Left message to return call 

## 2017-06-26 NOTE — Telephone Encounter (Signed)
See previous telephone note. 

## 2017-06-26 NOTE — Telephone Encounter (Signed)
Aaron Stout returned your call and asked for you to call him back.

## 2017-06-26 NOTE — Telephone Encounter (Signed)
He checks his BP at home, what are his readings?

## 2017-06-26 NOTE — Telephone Encounter (Signed)
Does not seem to be related to BP, stay on present reduced dose, check BP sit stand daily

## 2017-07-11 ENCOUNTER — Telehealth: Payer: Self-pay | Admitting: Cardiology

## 2017-07-11 NOTE — Telephone Encounter (Signed)
New message    Needs copy of instructions sent out to patient  Ct scheduled 08/02/17 130pm    Patient will need labs 1 week prior to test

## 2017-07-12 ENCOUNTER — Telehealth: Payer: Self-pay

## 2017-07-12 NOTE — Telephone Encounter (Signed)
Left message to return call on patient's mobile number per DPR as wife's phone number was not able to leave a message. Advised patient of need to reschedule his appointment with Dr. Bettina Gavia to the first week of June in order to have results of cardiac CTA. Advised patient front desk staff could assist with rescheduling or if he had any further questions he could ask to speak to the nurse.

## 2017-07-18 ENCOUNTER — Ambulatory Visit: Payer: BLUE CROSS/BLUE SHIELD | Admitting: Cardiology

## 2017-08-02 ENCOUNTER — Ambulatory Visit (HOSPITAL_COMMUNITY): Payer: BLUE CROSS/BLUE SHIELD

## 2017-08-08 ENCOUNTER — Ambulatory Visit: Payer: BLUE CROSS/BLUE SHIELD | Admitting: Cardiology

## 2017-08-21 ENCOUNTER — Telehealth: Payer: Self-pay | Admitting: Cardiology

## 2017-08-21 NOTE — Telephone Encounter (Signed)
Patient inquired if his new insurance was pre-certed for his cardiac CTA. The test was originally approved and scheduled under his old insurance. Message was sent to the pre-cert and scheduling pool 08/07/17 when the patient called to advise of new insurance. Advised patient would reach out to scheduler to determine if pre-cert was obtained under his new insurance. Advised patient would return call when received update. Patient verbalized understanding. No further questions.

## 2017-08-21 NOTE — Telephone Encounter (Signed)
Advised patient that pre-cert team is waiting for a response from [facility] and should hear back tomorrow morning. Advised patient would call and let him know status tomorrow morning. Patient verbalized understanding. No further questions.

## 2017-08-21 NOTE — Telephone Encounter (Signed)
About his cardiac CT scan

## 2017-08-22 NOTE — Telephone Encounter (Signed)
Advised patient that cardiac CTA has been approved with new insurance. Patient verbalized understanding. No further questions.

## 2017-08-23 ENCOUNTER — Ambulatory Visit (HOSPITAL_COMMUNITY)
Admission: RE | Admit: 2017-08-23 | Discharge: 2017-08-23 | Disposition: A | Discharge status: Home / Self Care | Payer: No Typology Code available for payment source | Source: Ambulatory Visit | Attending: Cardiology | Admitting: Cardiology

## 2017-08-23 ENCOUNTER — Encounter (HOSPITAL_COMMUNITY): Payer: Self-pay

## 2017-08-23 DIAGNOSIS — I2510 Atherosclerotic heart disease of native coronary artery without angina pectoris: Secondary | ICD-10-CM | POA: Diagnosis not present

## 2017-08-23 DIAGNOSIS — R911 Solitary pulmonary nodule: Secondary | ICD-10-CM | POA: Insufficient documentation

## 2017-08-23 DIAGNOSIS — I700 Atherosclerosis of aorta: Secondary | ICD-10-CM | POA: Diagnosis not present

## 2017-08-23 HISTORY — PX: CT CTA CORONARY W/CA SCORE W/CM &/OR WO/CM: HXRAD787

## 2017-08-23 LAB — POCT I-STAT CREATININE: CREATININE: 1.1 mg/dL (ref 0.61–1.24)

## 2017-08-23 MED ORDER — IOPAMIDOL (ISOVUE-370) INJECTION 76%
INTRAVENOUS | Status: AC
  Administered 2017-08-23: 80 mL
  Filled 2017-08-23: qty 100

## 2017-08-23 MED ORDER — NITROGLYCERIN 0.4 MG SL SUBL
SUBLINGUAL_TABLET | SUBLINGUAL | Status: AC
  Administered 2017-08-23: 0.4 mg
  Filled 2017-08-23: qty 1

## 2017-08-28 ENCOUNTER — Ambulatory Visit: Payer: BLUE CROSS/BLUE SHIELD | Admitting: Cardiovascular Disease

## 2017-08-29 ENCOUNTER — Ambulatory Visit (INDEPENDENT_AMBULATORY_CARE_PROVIDER_SITE_OTHER): Payer: No Typology Code available for payment source | Admitting: Cardiology

## 2017-08-29 ENCOUNTER — Telehealth: Payer: Self-pay | Admitting: Cardiology

## 2017-08-29 ENCOUNTER — Encounter: Payer: Self-pay | Admitting: Cardiology

## 2017-08-29 VITALS — BP 136/80 | HR 80 | Ht 73.0 in | Wt 239.8 lb

## 2017-08-29 DIAGNOSIS — I2510 Atherosclerotic heart disease of native coronary artery without angina pectoris: Secondary | ICD-10-CM | POA: Diagnosis not present

## 2017-08-29 DIAGNOSIS — I10 Essential (primary) hypertension: Secondary | ICD-10-CM | POA: Diagnosis not present

## 2017-08-29 DIAGNOSIS — E782 Mixed hyperlipidemia: Secondary | ICD-10-CM

## 2017-08-29 DIAGNOSIS — R0789 Other chest pain: Secondary | ICD-10-CM | POA: Diagnosis not present

## 2017-08-29 MED ORDER — NITROGLYCERIN 0.4 MG SL SUBL: 0.4000 mg | SUBLINGUAL_TABLET | SUBLINGUAL | 11 refills | Status: DC | PRN

## 2017-08-29 NOTE — Telephone Encounter (Signed)
Patient states doctor said he could have nitroglycerin and he would like to discuss that

## 2017-08-29 NOTE — H&P (View-Only) (Signed)
Cardiology Office Note:    Date:  08/29/2017   ID:  Aaron Stout, DOB 02-03-1956, MRN [id number]  PCP:  Aaron Redwood, MD  Cardiologist:  Aaron Campus, MD    Referring MD: Aaron Redwood, MD   Chief Complaint  Patient presents with  . Follow up on CT  I have abnormal tests  History of Present Illness:    Aaron Stout is a 62 y.o. male with coronary artery disease.  Apparently 1997 he had cardiac catheterization he was told to have 40% blockage then in 2010 another cardiac catheterization was done was described as luminal disease.  He started having some atypical symptoms with prompt another CAD work-up.  He did have a stress test done which apparently was read as negative but after that he gets CT of his chest his calcium score was more than 700 also CT angios showed possibility of significant stenosis in mid LAD there was also some lesions noted in proximal LAD sadly we were not able to do fractional flow reserve because of some motion artifact.  He comes here today to talk about options in this situation.  He described to have some pain he graded the pain 3-4 in scale up to 10 happening different situations sometimes with exercise.  He said when he walks uphill he will get this sensation and shortness of breath.  He does have chronic fatigue syndrome and he does not do physically much.  He does not exercise on the regular basis.  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  Lately however he does not do much work because of chronic fatigue.  On top of that he does have a problem with his back.  Past Medical History:  Diagnosis Date  . Adenomatous colon polyp 2001  . Allergic rhinitis   . ALLERGIC RHINITIS 05/18/2007   Qualifier: Diagnosis of  By: Ronnald Ramp RN, CGRN, Sheri    . Allergy   . Anxiety   . Arthritis    arthritis-hands  . Asthma   . ASTHMA 05/18/2007   Qualifier: Diagnosis of  By: Ronnald Ramp RN, CGRN, Sheri    . Bipolar 1  disorder ([place])   . CAD (coronary artery disease) 10/08/2014  . Cancer ([place])    hx skin cancer  . Chest tightness   . Chronic fatigue syndrome   . Chronic headaches    d/t old neck injury  . Complication of anesthesia    x1 episode "panic attack" awakening-none in recent years  . Coronary artery disease   . Depression   . DEPRESSION 05/18/2007   Qualifier: Diagnosis of  By: Ronnald Ramp RN, CGRN, Sheri    . Diverticulosis   . Dysrhythmia    was told benign by cardiologist  . GASTROESOPHAGEAL REFLUX DISEASE 05/18/2007   Qualifier: Diagnosis of  By: Ronnald Ramp RN, [place], [place]    . GERD (gastroesophageal reflux disease)   . HEADACHE, CHRONIC 05/18/2007   Qualifier: Diagnosis of  By: Ronnald Ramp RN, CGRN, Sheri    . Heart murmur    benign   . History of degenerative disc disease   . HOH (hard of hearing)    slight-bilateral  . Hypercholesterolemia   . Hyperlipidemia 05/18/2007   Qualifier: Diagnosis of  By: Ronnald Ramp RN, CGRN, Sheri    . IBS (irritable bowel syndrome)   . Internal and external hemorrhoids without complication   . Internal hemorrhoids with other complication 10/08/6657  . Irritable bowel  syndrome 05/18/2007   Qualifier: Diagnosis of  By: Ronnald Ramp RN, CGRN, Sheri    . Lyme disease   . Personal history of adenomatous colonic polyps 05/18/2007   2001, 2004, 2006, 2009 (last with one small adenoma) Max 4 polyps in past with largest 7 mm (both in 2004)  09/11/2013 7 small polyps adenomas  02/2017 5 adeoimas max 10 mm repeat colon 03/2020    . PONV (postoperative nausea and vomiting)   . Prostatitis   . PROSTATITIS, CHRONIC 05/18/2007   Qualifier: Diagnosis of  By: Ronnald Ramp RN, CGRN, Sheri    . Radiculopathy of cervical spine   . Sleep apnea    better s/p UPP-no problems now  . Stiff neck   . Syncope    last episode 1 yr ago-evaluated by cardiologist-negative findings-?related to low B/p (hydration tends to help).  . Testicular hypofunction   . Vitamin D deficiency     Past Surgical History:    Procedure Laterality Date  . [place]  . CARDIAC CATHETERIZATION  05/2008   negative  . CHOLECYSTECTOMY N/A 10/08/2014   Procedure: LAPAROSCOPIC CHOLECYSTECTOMY WITH INTRAOPERATIVE CHOLANGIOGRAM;  Surgeon: Erroll Luna, MD;  Location: [facility];  Service: General;  Laterality: N/A;  . COLONOSCOPY    . COLONOSCOPY W/ BIOPSIES  multiple  . ESOPHAGOGASTRODUODENOSCOPY  multiple  . GREEN LIGHT LASER TURP (TRANSURETHRAL RESECTION OF PROSTATE N/A 06/01/2012   Procedure: GREEN LIGHT LASER OF PROSTATE ;  Surgeon: Ailene Rud, MD;  Location: [facility];  Service: Urology;  Laterality: N/A;  . 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 NASAL POLYP EXCISION    . POLYPECTOMY    . TONSILLECTOMY    . UPPER GASTROINTESTINAL ENDOSCOPY    . UVULOPALATOPHARYNGOPLASTY  1990    Current Medications: Current Meds  Medication Sig  . AMBULATORY NON FORMULARY MEDICATION LV-GB complex Take 2 tablets by mouth at bettime  . AMBULATORY NON FORMULARY MEDICATION Serotrex Take 2 tablets at bedtime  . AMBULATORY NON FORMULARY MEDICATION P5P Take 1 tablet by mouth twice a day  . amphetamine-dextroamphetamine (ADDERALL XR) 20 MG 24 hr capsule Take 40 mg by mouth every morning.   Marland Kitchen aspirin 81 MG tablet Take 81 mg by mouth daily.  . Cholecalciferol (VITAMIN D) 2000 UNITS tablet Take 6,000 Units by mouth daily.   . Coenzyme Q10 (CO Q-10) 100 MG CAPS Take 100 mg by mouth daily.  . colestipol (COLESTID) 5 g granules Take 7.5 g by mouth daily.  . Cyanocobalamin (VITAMIN B 12 PO) Take 5,000 mcg by mouth daily.  Marland Kitchen DHEA 25 MG CAPS Take 1 capsule by mouth daily.  Marland Kitchen escitalopram (LEXAPRO) 5 MG tablet Take 5 mg by mouth daily.  . fexofenadine (ALLEGRA) 180 MG tablet Take 180 mg by mouth every evening.   Marland Kitchen glucosamine-chondroitin 500-400 MG tablet Take 1 tablet by mouth daily.   Marland Kitchen L-Lysine 1000 MG TABS Take by mouth. Take 1 tablet BID  . Melatonin 10 MG TABS Take 10 mg by mouth at bedtime.   .  Multiple Vitamin (MULTIVITAMIN) tablet Take 1 tablet by mouth daily.  . Omega-3 Fatty Acids (FISH OIL PO) Take 2,000 mg by mouth 2 (two) times daily.  Earney Navy Bicarbonate (ZEGERID) 20-1100 MG CAPS capsule Take 1 capsule by mouth 2 (two) times daily.   Marland Kitchen OVER THE COUNTER MEDICATION Take 100 mg by mouth daily. Pregnenolone supplement  . OVER THE COUNTER MEDICATION Take 400 mg by mouth daily. Sam-e l-methionine  LBCore protocol [employer], [place]  . Probiotic Product (PROBIOTIC DAILY PO) Take 1 tablet by mouth daily as needed (gut health).   . Psyllium (FIBER) 0.52 G CAPS Take 8 capsules by mouth every evening.   Marland Kitchen QUEtiapine (SEROQUEL) 25 MG tablet Take 25 mg by mouth 3 (three) times daily.   Marland Kitchen telmisartan (MICARDIS) 40 MG tablet Take 1 tablet (40 mg total) by mouth daily.  . Turmeric 500 MG CAPS Take 1 capsule by mouth 2 (two) times daily.  . valACYclovir (VALTREX) 500 MG tablet Take 500 mg by mouth 2 (two) times daily.      Allergies:   Dilaudid [hydromorphone hcl]; Cymbalta [duloxetine hcl]; Ibuprofen; and Penicillins   Social History   Socioeconomic History  . Marital status: Married    Spouse name: Not on file  . Number of children: 2  . Years of education: Not on file  . Highest education level: Not on file  Occupational History  . Occupation: Designer, jewellery: [employer]  Social Needs  . Financial resource strain: Not on file  . Food insecurity:    Worry: Not on file    Inability: Not on file  . Transportation needs:    Medical: Not on file    Non-medical: Not on file  Tobacco Use  . Smoking status: Former Smoker    Packs/day: 0.50    Years: 7.00    Pack years: 3.50    Types: Cigarettes    Last attempt to quit: 07/06/1980    Years since quitting: 37.1  . Smokeless tobacco: Never Used  Substance and Sexual Activity  . Alcohol use: No  . Drug use: No  . Sexual activity: Yes  Lifestyle  . Physical activity:    Days per week: Not on  file    Minutes per session: Not on file  . Stress: Not on file  Relationships  . Social connections:    Talks on phone: Not on file    Gets together: Not on file    Attends religious service: Not on file    Active member of club or organization: Not on file    Attends meetings of clubs or organizations: Not on file    Relationship status: Not on file  Other Topics Concern  . Not on file  Social History Narrative  . Not on file     Family History: The patient's family history includes Alcoholism in his unknown relative; Breast cancer (age of onset: 37) in his mother; CAD in his brother and brother; Colon polyps in his unknown relative; Heart disease in his unknown relative; Heart failure (age of onset: 71) in his father. There is no history of Colon cancer, Esophageal cancer, Rectal cancer, or Stomach cancer. ROS:   Please see the history of present illness.    All 14 point review of systems negative except as described per history of present illness  EKGs/Labs/Other Studies Reviewed:      Recent Labs: 05/26/2017: B Natriuretic Peptide 18.1; BUN 18; Hemoglobin 14.0; Platelets 214; Potassium 3.8; Sodium 138 08/23/2017: Creatinine, Ser 1.10  Recent Lipid Panel    Component Value Date/Time   CHOL 237 (H) 07/18/2011 1506   TRIG 124.0 07/18/2011 1506   HDL 50.70 07/18/2011 1506   CHOLHDL 5 07/18/2011 1506   VLDL 24.8 07/18/2011 1506   LDLDIRECT 180.6 07/18/2011 1506    Physical Exam:    VS:  BP 136/80   Pulse 80   Ht 6\' 1"  (  1.854 m)   Wt 239 lb 12.8 oz (108.8 kg)   SpO2 98%   BMI 31.64 kg/m     Wt Readings from Last 3 Encounters:  08/29/17 239 lb 12.8 oz (108.8 kg)  06/15/17 237 lb 1.9 oz (107.6 kg)  03/02/17 241 lb (109.3 kg)     GEN:  Well nourished, well developed in no acute distress HEENT: Normal NECK: No JVD; No carotid bruits LYMPHATICS: No lymphadenopathy CARDIAC: RRR, no murmurs, no rubs, no gallops RESPIRATORY:  Clear to auscultation without rales,  wheezing or rhonchi  ABDOMEN: Soft, non-tender, non-distended MUSCULOSKELETAL:  No edema; No deformity  SKIN: Warm and dry LOWER EXTREMITIES: no swelling NEUROLOGIC:  Alert and oriented x 3 PSYCHIATRIC:  Normal affect   ASSESSMENT:    1. Chest tightness   2. Mixed hyperlipidemia   3. Coronary artery disease involving native coronary artery of native heart without angina pectoris   4. Essential hypertension    PLAN:    In order of problems listed above:  1. Chest tightness with abnormal CT angios we had a long discussion visit lasted almost 1 hour we talked about options with the situation option being medical therapy versus cardiac catheterization.  He decided to proceed with cardiac catheterization however he wants to wait some time he is planning to go on vacations and he is thinking about either doing this before his trip or after.  He will let us know.  He also have appointment with Dr. Bettina Gavia at the end of this month and he want to keep up with that.  Any way he will let us know when he want to proceed with cardiac catheterization.  I wanted to put him some additional medication he was reluctant he did have significant drop in his blood pressure with nitroglycerin as needed. 2. This lipidemia again reluctant to add any medication for this complex situations.  This discussion however will continue. 3. Essential hypertension blood pressure well controlled.  Gentleman with somewhat atypical symptoms but enough worrisome testing that cardiac catheterization is warranted.  He agreed to have it he will let us know when he would like to proceed with it.   Medication Adjustments/Labs and Tests Ordered: Current medicines are reviewed at length with the patient today.  Concerns regarding medicines are outlined above.  No orders of the defined types were placed in this encounter.  Medication changes: No orders of the defined types were placed in this encounter.   Signed, Park Liter, MD, [facility] 08/29/2017 9:03 AM    [place]

## 2017-08-29 NOTE — Progress Notes (Signed)
Cardiology Office Note:    Date:  08/29/2017   ID:  Aaron Stout, DOB October 01, 1955, MRN [id number]  PCP:  Marton Redwood, MD  Cardiologist:  Jenne Campus, MD    Referring MD: Marton Redwood, MD   Chief Complaint  Patient presents with  . Follow up on CT  I have abnormal tests  History of Present Illness:    Aaron Stout is a 62 y.o. male with coronary artery disease.  Apparently 1997 he had cardiac catheterization he was told to have 40% blockage then in 2010 another cardiac catheterization was done was described as luminal disease.  He started having some atypical symptoms with prompt another CAD work-up.  He did have a stress test done which apparently was read as negative but after that he gets CT of his chest his calcium score was more than 700 also CT angios showed possibility of significant stenosis in mid LAD there was also some lesions noted in proximal LAD sadly we were not able to do fractional flow reserve because of some motion artifact.  He comes here today to talk about options in this situation.  He described to have some pain he graded the pain 3-4 in scale up to 10 happening different situations sometimes with exercise.  He said when he walks uphill he will get this sensation and shortness of breath.  He does have chronic fatigue syndrome and he does not do physically much.  He does not exercise on the regular basis.  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  Lately however he does not do much work because of chronic fatigue.  On top of that he does have a problem with his back.  Past Medical History:  Diagnosis Date  . Adenomatous colon polyp 2001  . Allergic rhinitis   . ALLERGIC RHINITIS 05/18/2007   Qualifier: Diagnosis of  By: Ronnald Ramp RN, CGRN, Sheri    . Allergy   . Anxiety   . Arthritis    arthritis-hands  . Asthma   . ASTHMA 05/18/2007   Qualifier: Diagnosis of  By: Ronnald Ramp RN, CGRN, Sheri    . Bipolar 1  disorder ([place])   . CAD (coronary artery disease) 10/08/2014  . Cancer ([place])    hx skin cancer  . Chest tightness   . Chronic fatigue syndrome   . Chronic headaches    d/t old neck injury  . Complication of anesthesia    x1 episode "panic attack" awakening-none in recent years  . Coronary artery disease   . Depression   . DEPRESSION 05/18/2007   Qualifier: Diagnosis of  By: Ronnald Ramp RN, CGRN, Sheri    . Diverticulosis   . Dysrhythmia    was told benign by cardiologist  . GASTROESOPHAGEAL REFLUX DISEASE 05/18/2007   Qualifier: Diagnosis of  By: Ronnald Ramp RN, [place], [place]    . GERD (gastroesophageal reflux disease)   . HEADACHE, CHRONIC 05/18/2007   Qualifier: Diagnosis of  By: Ronnald Ramp RN, CGRN, Sheri    . Heart murmur    benign   . History of degenerative disc disease   . HOH (hard of hearing)    slight-bilateral  . Hypercholesterolemia   . Hyperlipidemia 05/18/2007   Qualifier: Diagnosis of  By: Ronnald Ramp RN, CGRN, Sheri    . IBS (irritable bowel syndrome)   . Internal and external hemorrhoids without complication   . Internal hemorrhoids with other complication 04/08/1939  . Irritable bowel  syndrome 05/18/2007   Qualifier: Diagnosis of  By: Ronnald Ramp RN, CGRN, Sheri    . Lyme disease   . Personal history of adenomatous colonic polyps 05/18/2007   2001, 2004, 2006, 2009 (last with one small adenoma) Max 4 polyps in past with largest 7 mm (both in 2004)  09/11/2013 7 small polyps adenomas  02/2017 5 adeoimas max 10 mm repeat colon 03/2020    . PONV (postoperative nausea and vomiting)   . Prostatitis   . PROSTATITIS, CHRONIC 05/18/2007   Qualifier: Diagnosis of  By: Ronnald Ramp RN, CGRN, Sheri    . Radiculopathy of cervical spine   . Sleep apnea    better s/p UPP-no problems now  . Stiff neck   . Syncope    last episode 1 yr ago-evaluated by cardiologist-negative findings-?related to low B/p (hydration tends to help).  . Testicular hypofunction   . Vitamin D deficiency     Past Surgical History:    Procedure Laterality Date  . [place]  . CARDIAC CATHETERIZATION  05/2008   negative  . CHOLECYSTECTOMY N/A 10/08/2014   Procedure: LAPAROSCOPIC CHOLECYSTECTOMY WITH INTRAOPERATIVE CHOLANGIOGRAM;  Surgeon: Erroll Luna, MD;  Location: [facility];  Service: General;  Laterality: N/A;  . COLONOSCOPY    . COLONOSCOPY W/ BIOPSIES  multiple  . ESOPHAGOGASTRODUODENOSCOPY  multiple  . GREEN LIGHT LASER TURP (TRANSURETHRAL RESECTION OF PROSTATE N/A 06/01/2012   Procedure: GREEN LIGHT LASER OF PROSTATE ;  Surgeon: Ailene Rud, MD;  Location: [facility];  Service: Urology;  Laterality: N/A;  . 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 NASAL POLYP EXCISION    . POLYPECTOMY    . TONSILLECTOMY    . UPPER GASTROINTESTINAL ENDOSCOPY    . UVULOPALATOPHARYNGOPLASTY  1990    Current Medications: Current Meds  Medication Sig  . AMBULATORY NON FORMULARY MEDICATION LV-GB complex Take 2 tablets by mouth at bettime  . AMBULATORY NON FORMULARY MEDICATION Serotrex Take 2 tablets at bedtime  . AMBULATORY NON FORMULARY MEDICATION P5P Take 1 tablet by mouth twice a day  . amphetamine-dextroamphetamine (ADDERALL XR) 20 MG 24 hr capsule Take 40 mg by mouth every morning.   Marland Kitchen aspirin 81 MG tablet Take 81 mg by mouth daily.  . Cholecalciferol (VITAMIN D) 2000 UNITS tablet Take 6,000 Units by mouth daily.   . Coenzyme Q10 (CO Q-10) 100 MG CAPS Take 100 mg by mouth daily.  . colestipol (COLESTID) 5 g granules Take 7.5 g by mouth daily.  . Cyanocobalamin (VITAMIN B 12 PO) Take 5,000 mcg by mouth daily.  Marland Kitchen DHEA 25 MG CAPS Take 1 capsule by mouth daily.  Marland Kitchen escitalopram (LEXAPRO) 5 MG tablet Take 5 mg by mouth daily.  . fexofenadine (ALLEGRA) 180 MG tablet Take 180 mg by mouth every evening.   Marland Kitchen glucosamine-chondroitin 500-400 MG tablet Take 1 tablet by mouth daily.   Marland Kitchen L-Lysine 1000 MG TABS Take by mouth. Take 1 tablet BID  . Melatonin 10 MG TABS Take 10 mg by mouth at bedtime.   .  Multiple Vitamin (MULTIVITAMIN) tablet Take 1 tablet by mouth daily.  . Omega-3 Fatty Acids (FISH OIL PO) Take 2,000 mg by mouth 2 (two) times daily.  Earney Navy Bicarbonate (ZEGERID) 20-1100 MG CAPS capsule Take 1 capsule by mouth 2 (two) times daily.   Marland Kitchen OVER THE COUNTER MEDICATION Take 100 mg by mouth daily. Pregnenolone supplement  . OVER THE COUNTER MEDICATION Take 400 mg by mouth daily. Sam-e l-methionine  LBCore protocol [employer], [place]  . Probiotic Product (PROBIOTIC DAILY PO) Take 1 tablet by mouth daily as needed (gut health).   . Psyllium (FIBER) 0.52 G CAPS Take 8 capsules by mouth every evening.   Marland Kitchen QUEtiapine (SEROQUEL) 25 MG tablet Take 25 mg by mouth 3 (three) times daily.   Marland Kitchen telmisartan (MICARDIS) 40 MG tablet Take 1 tablet (40 mg total) by mouth daily.  . Turmeric 500 MG CAPS Take 1 capsule by mouth 2 (two) times daily.  . valACYclovir (VALTREX) 500 MG tablet Take 500 mg by mouth 2 (two) times daily.      Allergies:   Dilaudid [hydromorphone hcl]; Cymbalta [duloxetine hcl]; Ibuprofen; and Penicillins   Social History   Socioeconomic History  . Marital status: Married    Spouse name: Not on file  . Number of children: 2  . Years of education: Not on file  . Highest education level: Not on file  Occupational History  . Occupation: Designer, jewellery: [employer]  Social Needs  . Financial resource strain: Not on file  . Food insecurity:    Worry: Not on file    Inability: Not on file  . Transportation needs:    Medical: Not on file    Non-medical: Not on file  Tobacco Use  . Smoking status: Former Smoker    Packs/day: 0.50    Years: 7.00    Pack years: 3.50    Types: Cigarettes    Last attempt to quit: 07/06/1980    Years since quitting: 37.1  . Smokeless tobacco: Never Used  Substance and Sexual Activity  . Alcohol use: No  . Drug use: No  . Sexual activity: Yes  Lifestyle  . Physical activity:    Days per week: Not on  file    Minutes per session: Not on file  . Stress: Not on file  Relationships  . Social connections:    Talks on phone: Not on file    Gets together: Not on file    Attends religious service: Not on file    Active member of club or organization: Not on file    Attends meetings of clubs or organizations: Not on file    Relationship status: Not on file  Other Topics Concern  . Not on file  Social History Narrative  . Not on file     Family History: The patient's family history includes Alcoholism in his unknown relative; Breast cancer (age of onset: 15) in his mother; CAD in his brother and brother; Colon polyps in his unknown relative; Heart disease in his unknown relative; Heart failure (age of onset: 29) in his father. There is no history of Colon cancer, Esophageal cancer, Rectal cancer, or Stomach cancer. ROS:   Please see the history of present illness.    All 14 point review of systems negative except as described per history of present illness  EKGs/Labs/Other Studies Reviewed:      Recent Labs: 05/26/2017: B Natriuretic Peptide 18.1; BUN 18; Hemoglobin 14.0; Platelets 214; Potassium 3.8; Sodium 138 08/23/2017: Creatinine, Ser 1.10  Recent Lipid Panel    Component Value Date/Time   CHOL 237 (H) 07/18/2011 1506   TRIG 124.0 07/18/2011 1506   HDL 50.70 07/18/2011 1506   CHOLHDL 5 07/18/2011 1506   VLDL 24.8 07/18/2011 1506   LDLDIRECT 180.6 07/18/2011 1506    Physical Exam:    VS:  BP 136/80   Pulse 80   Ht 6\' 1"  (  1.854 m)   Wt 239 lb 12.8 oz (108.8 kg)   SpO2 98%   BMI 31.64 kg/m     Wt Readings from Last 3 Encounters:  08/29/17 239 lb 12.8 oz (108.8 kg)  06/15/17 237 lb 1.9 oz (107.6 kg)  03/02/17 241 lb (109.3 kg)     GEN:  Well nourished, well developed in no acute distress HEENT: Normal NECK: No JVD; No carotid bruits LYMPHATICS: No lymphadenopathy CARDIAC: RRR, no murmurs, no rubs, no gallops RESPIRATORY:  Clear to auscultation without rales,  wheezing or rhonchi  ABDOMEN: Soft, non-tender, non-distended MUSCULOSKELETAL:  No edema; No deformity  SKIN: Warm and dry LOWER EXTREMITIES: no swelling NEUROLOGIC:  Alert and oriented x 3 PSYCHIATRIC:  Normal affect   ASSESSMENT:    1. Chest tightness   2. Mixed hyperlipidemia   3. Coronary artery disease involving native coronary artery of native heart without angina pectoris   4. Essential hypertension    PLAN:    In order of problems listed above:  1. Chest tightness with abnormal CT angios we had a long discussion visit lasted almost 1 hour we talked about options with the situation option being medical therapy versus cardiac catheterization.  He decided to proceed with cardiac catheterization however he wants to wait some time he is planning to go on vacations and he is thinking about either doing this before his trip or after.  He will let us know.  He also have appointment with Dr. Bettina Gavia at the end of this month and he want to keep up with that.  Any way he will let us know when he want to proceed with cardiac catheterization.  I wanted to put him some additional medication he was reluctant he did have significant drop in his blood pressure with nitroglycerin as needed. 2. This lipidemia again reluctant to add any medication for this complex situations.  This discussion however will continue. 3. Essential hypertension blood pressure well controlled.  Gentleman with somewhat atypical symptoms but enough worrisome testing that cardiac catheterization is warranted.  He agreed to have it he will let us know when he would like to proceed with it.   Medication Adjustments/Labs and Tests Ordered: Current medicines are reviewed at length with the patient today.  Concerns regarding medicines are outlined above.  No orders of the defined types were placed in this encounter.  Medication changes: No orders of the defined types were placed in this encounter.   Signed, Park Liter, MD, [facility] 08/29/2017 9:03 AM    [place]

## 2017-08-29 NOTE — Telephone Encounter (Signed)
Patient had questions concerning his conversation with Dr. Agustin Cree concerning nitroglycerin. Dr. Agustin Cree said he did not prescribe this medication because when he took it in the past his blood pressure dropped significantly. However, Dr. Agustin Cree agreed to prescribe nitroglycerin PRN as long as if and when the patient needs to take this medication he is sitting or laying down. Patient verbalized understanding. No further questions. Prescription sent to [facility] in [place].

## 2017-08-29 NOTE — Patient Instructions (Addendum)
Medication Instructions:  Your physician recommends that you continue on your current medications as directed. Please refer to the Current Medication list given to you today.   Labwork: None  Testing/Procedures: None  Follow-Up: Your physician recommends that you schedule a follow-up appointment with Dr. Bettina Gavia on Monday, 09/25/17 at 4:20.    If you need a refill on your cardiac medications before your next appointment, please call your pharmacy.   Thank you for choosing [facility]! Robyne Peers, RN [phone]

## 2017-09-01 ENCOUNTER — Telehealth: Payer: Self-pay | Admitting: *Deleted

## 2017-09-01 ENCOUNTER — Encounter: Payer: Self-pay | Admitting: *Deleted

## 2017-09-01 DIAGNOSIS — Z01818 Encounter for other preprocedural examination: Secondary | ICD-10-CM

## 2017-09-01 NOTE — Telephone Encounter (Signed)
Spoke with Dr. Agustin Cree about patient being ready to schedule heart catheterization on 09/12/17 or 09/15/17. Dr. Agustin Cree advised to proceed with scheduling this procedure. Patient is scheduled for left heart catheterization on Tuesday, 09/12/17 at 7:30 am with Dr. Ellyn Hack. Reviewed instructions with the patient over the phone and scheduled a nurse visit for EKG and lab work on 09/11/17. Patient will have a chest x-ray done that day as well. Patient verbalized understanding of instructions and mailed him a copy. No further questions.

## 2017-09-07 DIAGNOSIS — I209 Angina pectoris, unspecified: Secondary | ICD-10-CM | POA: Diagnosis present

## 2017-09-07 DIAGNOSIS — R931 Abnormal findings on diagnostic imaging of heart and coronary circulation: Secondary | ICD-10-CM | POA: Diagnosis present

## 2017-09-07 DIAGNOSIS — I200 Unstable angina: Secondary | ICD-10-CM | POA: Diagnosis present

## 2017-09-11 ENCOUNTER — Other Ambulatory Visit: Payer: Self-pay | Admitting: Cardiology

## 2017-09-11 ENCOUNTER — Ambulatory Visit (INDEPENDENT_AMBULATORY_CARE_PROVIDER_SITE_OTHER): Payer: PRIVATE HEALTH INSURANCE | Admitting: Cardiology

## 2017-09-11 ENCOUNTER — Telehealth: Payer: Self-pay | Admitting: *Deleted

## 2017-09-11 DIAGNOSIS — Z01818 Encounter for other preprocedural examination: Secondary | ICD-10-CM

## 2017-09-11 NOTE — Progress Notes (Signed)
Patient came to the office today to have an EKG and Labs pre procedure. He is schedule for his Left Heart Cath tomorrow.

## 2017-09-11 NOTE — Telephone Encounter (Signed)
Discussed instructions with patient, he verbalized understanding, thanked me for call.  

## 2017-09-11 NOTE — Telephone Encounter (Signed)
Catheterization scheduled at [facility] ([place]) for: Tuesday September 12, 2017 7:30 AM Verified arrival time and place: [place] Entrance A at: 5:30 AM  No solid food after midnight prior to cath, clear liquids until 5 AM day of procedure. Verify allergies in Epic Verify no diabetes medications.  AM meds can be  taken pre-cath with sip of water including: ASA 81 mg  Confirm patient has responsible person to drive home post procedure and for 24 hours after you arrive home.   LMTCB to discuss instructions with patient.

## 2017-09-12 ENCOUNTER — Ambulatory Visit (HOSPITAL_COMMUNITY)
Admission: RE | Admit: 2017-09-12 | Discharge: 2017-09-12 | Disposition: A | Discharge status: Home / Self Care | Payer: No Typology Code available for payment source | Source: Ambulatory Visit | Attending: Cardiology | Admitting: Cardiology

## 2017-09-12 ENCOUNTER — Encounter (HOSPITAL_COMMUNITY): Payer: Self-pay | Admitting: *Deleted

## 2017-09-12 ENCOUNTER — Encounter (HOSPITAL_COMMUNITY)
Admission: RE | Disposition: A | Discharge status: Home / Self Care | Payer: Self-pay | Source: Ambulatory Visit | Attending: Cardiology

## 2017-09-12 DIAGNOSIS — J45909 Unspecified asthma, uncomplicated: Secondary | ICD-10-CM | POA: Insufficient documentation

## 2017-09-12 DIAGNOSIS — I2510 Atherosclerotic heart disease of native coronary artery without angina pectoris: Secondary | ICD-10-CM | POA: Diagnosis present

## 2017-09-12 DIAGNOSIS — Z7982 Long term (current) use of aspirin: Secondary | ICD-10-CM | POA: Insufficient documentation

## 2017-09-12 DIAGNOSIS — I25118 Atherosclerotic heart disease of native coronary artery with other forms of angina pectoris: Secondary | ICD-10-CM | POA: Diagnosis not present

## 2017-09-12 DIAGNOSIS — Z79899 Other long term (current) drug therapy: Secondary | ICD-10-CM | POA: Insufficient documentation

## 2017-09-12 DIAGNOSIS — F319 Bipolar disorder, unspecified: Secondary | ICD-10-CM | POA: Insufficient documentation

## 2017-09-12 DIAGNOSIS — R51 Headache: Secondary | ICD-10-CM | POA: Insufficient documentation

## 2017-09-12 DIAGNOSIS — Z85828 Personal history of other malignant neoplasm of skin: Secondary | ICD-10-CM | POA: Insufficient documentation

## 2017-09-12 DIAGNOSIS — Z9889 Other specified postprocedural states: Secondary | ICD-10-CM | POA: Insufficient documentation

## 2017-09-12 DIAGNOSIS — R002 Palpitations: Secondary | ICD-10-CM | POA: Diagnosis present

## 2017-09-12 DIAGNOSIS — Z87891 Personal history of nicotine dependence: Secondary | ICD-10-CM | POA: Insufficient documentation

## 2017-09-12 DIAGNOSIS — I200 Unstable angina: Secondary | ICD-10-CM | POA: Diagnosis present

## 2017-09-12 DIAGNOSIS — Z8249 Family history of ischemic heart disease and other diseases of the circulatory system: Secondary | ICD-10-CM | POA: Diagnosis not present

## 2017-09-12 DIAGNOSIS — Z886 Allergy status to analgesic agent status: Secondary | ICD-10-CM | POA: Insufficient documentation

## 2017-09-12 DIAGNOSIS — G4730 Sleep apnea, unspecified: Secondary | ICD-10-CM | POA: Diagnosis not present

## 2017-09-12 DIAGNOSIS — Z9049 Acquired absence of other specified parts of digestive tract: Secondary | ICD-10-CM | POA: Insufficient documentation

## 2017-09-12 DIAGNOSIS — K589 Irritable bowel syndrome without diarrhea: Secondary | ICD-10-CM | POA: Diagnosis not present

## 2017-09-12 DIAGNOSIS — I2584 Coronary atherosclerosis due to calcified coronary lesion: Secondary | ICD-10-CM | POA: Diagnosis not present

## 2017-09-12 DIAGNOSIS — E559 Vitamin D deficiency, unspecified: Secondary | ICD-10-CM | POA: Insufficient documentation

## 2017-09-12 DIAGNOSIS — Z880 Allergy status to penicillin: Secondary | ICD-10-CM | POA: Insufficient documentation

## 2017-09-12 DIAGNOSIS — I10 Essential (primary) hypertension: Secondary | ICD-10-CM | POA: Diagnosis not present

## 2017-09-12 DIAGNOSIS — Z86010 Personal history of colonic polyps: Secondary | ICD-10-CM | POA: Insufficient documentation

## 2017-09-12 DIAGNOSIS — Z8371 Family history of colonic polyps: Secondary | ICD-10-CM | POA: Insufficient documentation

## 2017-09-12 DIAGNOSIS — I25119 Atherosclerotic heart disease of native coronary artery with unspecified angina pectoris: Secondary | ICD-10-CM | POA: Diagnosis present

## 2017-09-12 DIAGNOSIS — M1990 Unspecified osteoarthritis, unspecified site: Secondary | ICD-10-CM | POA: Insufficient documentation

## 2017-09-12 DIAGNOSIS — F419 Anxiety disorder, unspecified: Secondary | ICD-10-CM | POA: Insufficient documentation

## 2017-09-12 DIAGNOSIS — I209 Angina pectoris, unspecified: Secondary | ICD-10-CM | POA: Diagnosis present

## 2017-09-12 DIAGNOSIS — Z885 Allergy status to narcotic agent status: Secondary | ICD-10-CM | POA: Insufficient documentation

## 2017-09-12 DIAGNOSIS — R931 Abnormal findings on diagnostic imaging of heart and coronary circulation: Secondary | ICD-10-CM | POA: Diagnosis present

## 2017-09-12 DIAGNOSIS — K219 Gastro-esophageal reflux disease without esophagitis: Secondary | ICD-10-CM | POA: Diagnosis not present

## 2017-09-12 DIAGNOSIS — E782 Mixed hyperlipidemia: Secondary | ICD-10-CM | POA: Insufficient documentation

## 2017-09-12 DIAGNOSIS — Z888 Allergy status to other drugs, medicaments and biological substances status: Secondary | ICD-10-CM | POA: Insufficient documentation

## 2017-09-12 HISTORY — PX: LEFT HEART CATH AND CORONARY ANGIOGRAPHY: CATH118249

## 2017-09-12 LAB — CBC
HEMATOCRIT: 43.7 % (ref 39.0–52.0)
Hematocrit: 43.6 % (ref 37.5–51.0)
Hemoglobin: 15.1 g/dL (ref 13.0–17.7)
Hemoglobin: 14.5 g/dL (ref 13.0–17.0)
MCH: 32.4 pg (ref 26.6–33.0)
MCH: 31.4 pg (ref 26.0–34.0)
MCHC: 34.6 g/dL (ref 31.5–35.7)
MCHC: 33.2 g/dL (ref 30.0–36.0)
MCV: 94 fL (ref 79–97)
MCV: 94.6 fL (ref 78.0–100.0)
PLATELETS: 223 10*3/uL (ref 150–450)
Platelets: 209 10*3/uL (ref 150–400)
RBC: 4.66 x10E6/uL (ref 4.14–5.80)
RBC: 4.62 MIL/uL (ref 4.22–5.81)
RDW: 14.1 % (ref 12.3–15.4)
RDW: 13 % (ref 11.5–15.5)
WBC: 7.6 10*3/uL (ref 3.4–10.8)
WBC: 8.7 10*3/uL (ref 4.0–10.5)

## 2017-09-12 LAB — BASIC METABOLIC PANEL
Anion gap: 6 (ref 5–15)
BUN/Creatinine Ratio: 13 (ref 10–24)
BUN: 16 mg/dL (ref 8–27)
BUN: 17 mg/dL (ref 8–23)
CALCIUM: 10.1 mg/dL (ref 8.6–10.2)
CHLORIDE: 101 mmol/L (ref 96–106)
CO2: 23 mmol/L (ref 20–29)
CO2: 29 mmol/L (ref 22–32)
Calcium: 9.8 mg/dL (ref 8.9–10.3)
Chloride: 105 mmol/L (ref 98–111)
Creatinine, Ser: 1.19 mg/dL (ref 0.76–1.27)
Creatinine, Ser: 1.24 mg/dL (ref 0.61–1.24)
GFR calc Af Amer: >60 mL/min (ref >60)
GFR calc non Af Amer: 65 mL/min/{1.73_m2} (ref >59)
GFR calc non Af Amer: >60 mL/min (ref >60)
GFR, EST AFRICAN AMERICAN: 75 mL/min/{1.73_m2} (ref >59)
GLUCOSE: 117 mg/dL — AB (ref 65–99)
GLUCOSE: 103 mg/dL — AB (ref 70–99)
POTASSIUM: 4.3 mmol/L (ref 3.5–5.2)
POTASSIUM: 4 mmol/L (ref 3.5–5.1)
Sodium: 140 mmol/L (ref 134–144)
Sodium: 140 mmol/L (ref 135–145)

## 2017-09-12 SURGERY — LEFT HEART CATH AND CORONARY ANGIOGRAPHY
Anesthesia: LOCAL

## 2017-09-12 MED ORDER — SODIUM CHLORIDE 0.9 % IV SOLN: 250.0000 mL | INTRAVENOUS | Status: DC | PRN

## 2017-09-12 MED ORDER — ACETAMINOPHEN 325 MG PO TABS: 650.0000 mg | ORAL_TABLET | ORAL | Status: DC | PRN

## 2017-09-12 MED ORDER — MIDAZOLAM HCL 2 MG/2ML IJ SOLN
INTRAMUSCULAR | Status: DC | PRN
  Administered 2017-09-12: 2 mg via INTRAVENOUS

## 2017-09-12 MED ORDER — LIDOCAINE HCL (PF) 1 % IJ SOLN
INTRAMUSCULAR | Status: DC | PRN
  Administered 2017-09-12: 2 mL

## 2017-09-12 MED ORDER — SODIUM CHLORIDE 0.9% FLUSH: 3.0000 mL | INTRAVENOUS | Status: DC | PRN

## 2017-09-12 MED ORDER — SODIUM CHLORIDE 0.9% FLUSH: 3.0000 mL | Freq: Two times a day (BID) | INTRAVENOUS | Status: DC

## 2017-09-12 MED ORDER — HEPARIN SODIUM (PORCINE) 1000 UNIT/ML IJ SOLN
INTRAMUSCULAR | Status: AC
  Filled 2017-09-12: qty 1

## 2017-09-12 MED ORDER — ONDANSETRON HCL 4 MG/2ML IJ SOLN: 4.0000 mg | Freq: Four times a day (QID) | INTRAMUSCULAR | Status: DC | PRN

## 2017-09-12 MED ORDER — HEPARIN (PORCINE) IN NACL 2-0.9 UNITS/ML
INTRAMUSCULAR | Status: AC | PRN
  Administered 2017-09-12 (×3): 500 mL

## 2017-09-12 MED ORDER — HEPARIN (PORCINE) IN NACL 1000-0.9 UT/500ML-% IV SOLN
INTRAVENOUS | Status: AC
  Filled 2017-09-12: qty 1000

## 2017-09-12 MED ORDER — VERAPAMIL HCL 2.5 MG/ML IV SOLN
INTRAVENOUS | Status: AC
  Filled 2017-09-12: qty 2

## 2017-09-12 MED ORDER — SODIUM CHLORIDE 0.9 % WEIGHT BASED INFUSION
3.0000 mL/kg/h | INTRAVENOUS | Status: AC
  Administered 2017-09-12: 3 mL/kg/h via INTRAVENOUS

## 2017-09-12 MED ORDER — HEPARIN SODIUM (PORCINE) 1000 UNIT/ML IJ SOLN
INTRAMUSCULAR | Status: DC | PRN
  Administered 2017-09-12: 4500 [IU] via INTRAVENOUS

## 2017-09-12 MED ORDER — VERAPAMIL HCL 2.5 MG/ML IV SOLN
INTRAVENOUS | Status: DC | PRN
  Administered 2017-09-12: 08:00:00 via INTRA_ARTERIAL

## 2017-09-12 MED ORDER — MIDAZOLAM HCL 2 MG/2ML IJ SOLN
INTRAMUSCULAR | Status: AC
  Filled 2017-09-12: qty 2

## 2017-09-12 MED ORDER — NITROGLYCERIN 1 MG/10 ML FOR IR/CATH LAB
INTRA_ARTERIAL | Status: AC
  Filled 2017-09-12: qty 10

## 2017-09-12 MED ORDER — HEPARIN (PORCINE) IN NACL 2000-0.9 UNIT/L-% IV SOLN
INTRAVENOUS | Status: AC
  Filled 2017-09-12: qty 1000

## 2017-09-12 MED ORDER — ASPIRIN 81 MG PO CHEW: 81.0000 mg | CHEWABLE_TABLET | ORAL | Status: DC

## 2017-09-12 MED ORDER — SODIUM CHLORIDE 0.9 % WEIGHT BASED INFUSION: 1.0000 mL/kg/h | INTRAVENOUS | Status: DC

## 2017-09-12 MED ORDER — IOHEXOL 350 MG/ML SOLN
INTRAVENOUS | Status: DC | PRN
  Administered 2017-09-12: 75 mL via INTRACARDIAC

## 2017-09-12 MED ORDER — SODIUM CHLORIDE 0.9 % IV SOLN: INTRAVENOUS | Status: DC

## 2017-09-12 MED ORDER — LIDOCAINE HCL (PF) 1 % IJ SOLN
INTRAMUSCULAR | Status: AC
  Filled 2017-09-12: qty 30

## 2017-09-12 SURGICAL SUPPLY — 11 items
CATH INFINITI 5FR ANG PIGTAIL (CATHETERS) ×1 IMPLANT
CATH INFINITI JR4 5F (CATHETERS) ×1 IMPLANT
CATH OPTITORQUE TIG 4.0 5F (CATHETERS) ×1 IMPLANT
DEVICE RAD COMP TR BAND LRG (VASCULAR PRODUCTS) ×1 IMPLANT
GLIDESHEATH SLEND A-KIT 6F 22G (SHEATH) ×1 IMPLANT
GUIDEWIRE INQWIRE 1.5J.035X260 (WIRE) IMPLANT
INQWIRE 1.5J .035X260CM (WIRE) ×2
KIT HEART LEFT (KITS) ×2 IMPLANT
PACK CARDIAC CATHETERIZATION (CUSTOM PROCEDURE TRAY) ×2 IMPLANT
TRANSDUCER W/STOPCOCK (MISCELLANEOUS) ×2 IMPLANT
TUBING CIL FLEX 10 FLL-RA (TUBING) ×2 IMPLANT

## 2017-09-12 NOTE — Interval H&P Note (Signed)
History and Physical Interval Note:  09/12/2017 7:36 AM  Aaron Stout  has presented today for surgery, with the diagnosis of abnormal coronary ct, atypical angina.    The various methods of treatment have been discussed with the patient and family. After consideration of risks, benefits and other options for treatment, the patient has consented to  Procedure(s): LEFT HEART CATH AND CORONARY ANGIOGRAPHY (N/A) with possible PERCUTANEOUS CORONARY INTERVENTION as a surgical intervention .  The patient's history has been reviewed, patient examined, no change in status, stable for surgery.  I have reviewed the patient's chart and labs.  Questions were answered to the patient's satisfaction.    Cath Lab Visit (complete for each Cath Lab visit)  Clinical Evaluation Leading to the Procedure:   ACS: No.  Non-ACS:    Anginal Classification: CCS III  Anti-ischemic medical therapy: Minimal Therapy (1 class of medications)  Non-Invasive Test Results: Intermediate-risk stress test findings: cardiac mortality 1-3%/year  Prior CABG: No previous CABG    Glenetta Hew

## 2017-09-12 NOTE — Discharge Instructions (Signed)
**Note -identified via Obfuscation** Radial Site Care °Refer to this sheet in the next few weeks. These instructions provide you with information about caring for yourself after your procedure. Your health care provider may also give you more specific instructions. Your treatment has been planned according to current medical practices, but problems sometimes occur. Call your health care provider if you have any problems or questions after your procedure. °What can I expect after the procedure? °After your procedure, it is typical to have the following: °· Bruising at the radial site that usually fades within 1-2 weeks. °· Blood collecting in the tissue (hematoma) that may be painful to the touch. It should usually decrease in size and tenderness within 1-2 weeks. ° °Follow these instructions at home: °· Take medicines only as directed by your health care provider. °· You may shower 24-48 hours after the procedure or as directed by your health care provider. Remove the bandage (dressing) and gently wash the site with plain soap and water. Pat the area dry with a clean towel. Do not rub the site, because this may cause bleeding. °· Do not take baths, swim, or use a hot tub until your health care provider approves. °· Check your insertion site every day for redness, swelling, or drainage. °· Do not apply powder or lotion to the site. °· Do not flex or bend the affected arm for 24 hours or as directed by your health care provider. °· Do not push or pull heavy objects with the affected arm for 24 hours or as directed by your health care provider. °· Do not lift over 10 lb (4.5 kg) for 5 days after your procedure or as directed by your health care provider. °· Ask your health care provider when it is okay to: °? Return to work or school. °? Resume usual physical activities or sports. °? Resume sexual activity. °· Do not drive home if you are discharged the same day as the procedure. Have someone else drive you. °· You may drive 24 hours after the procedure  unless otherwise instructed by your health care provider. °· Do not operate machinery or power tools for 24 hours after the procedure. °· If your procedure was done as an outpatient procedure, which means that you went home the same day as your procedure, a responsible adult should be with you for the first 24 hours after you arrive home. °· Keep all follow-up visits as directed by your health care provider. This is important. °Contact a health care provider if: °· You have a fever. °· You have chills. °· You have increased bleeding from the radial site. Hold pressure on the site. °Get help right away if: °· You have unusual pain at the radial site. °· You have redness, warmth, or swelling at the radial site. °· You have drainage (other than a small amount of blood on the dressing) from the radial site. °· The radial site is bleeding, and the bleeding does not stop after 30 minutes of holding steady pressure on the site. °· Your arm or hand becomes pale, cool, tingly, or numb. °This information is not intended to replace advice given to you by your health care provider. Make sure you discuss any questions you have with your health care provider. °Document Released: 03/26/2010 Document Revised: 07/30/2015 Document Reviewed: 09/09/2013 °Elsevier Interactive Patient Education © 2018 [employer] ° °

## 2017-09-15 ENCOUNTER — Encounter (INDEPENDENT_AMBULATORY_CARE_PROVIDER_SITE_OTHER): Payer: Self-pay

## 2017-09-20 ENCOUNTER — Encounter: Payer: Self-pay | Admitting: Cardiology

## 2017-09-20 ENCOUNTER — Ambulatory Visit (INDEPENDENT_AMBULATORY_CARE_PROVIDER_SITE_OTHER): Payer: PRIVATE HEALTH INSURANCE | Admitting: Cardiology

## 2017-09-20 VITALS — BP 128/82 | HR 104 | Ht 73.0 in | Wt 235.8 lb

## 2017-09-20 DIAGNOSIS — E782 Mixed hyperlipidemia: Secondary | ICD-10-CM

## 2017-09-20 DIAGNOSIS — I10 Essential (primary) hypertension: Secondary | ICD-10-CM

## 2017-09-20 DIAGNOSIS — I4510 Unspecified right bundle-branch block: Secondary | ICD-10-CM

## 2017-09-20 DIAGNOSIS — I2510 Atherosclerotic heart disease of native coronary artery without angina pectoris: Secondary | ICD-10-CM | POA: Diagnosis not present

## 2017-09-20 DIAGNOSIS — I209 Angina pectoris, unspecified: Secondary | ICD-10-CM | POA: Diagnosis not present

## 2017-09-20 MED ORDER — METOPROLOL SUCCINATE ER 50 MG PO TB24: 50.0000 mg | ORAL_TABLET | Freq: Every day | ORAL | 3 refills | Status: DC

## 2017-09-20 MED ORDER — AMLODIPINE BESYLATE 5 MG PO TABS: 5.0000 mg | ORAL_TABLET | Freq: Every day | ORAL | 3 refills | Status: DC

## 2017-09-20 NOTE — Patient Instructions (Addendum)
Medication Instructions:  Your physician has recommended you make the following change in your medication:   Start: amlodipine 5mg  one tablet daily Start: metoprolol 50mg  one tablet daily   Labwork: NONE  Testing/Procedures: You had an EKG today  Follow-Up: Your physician recommends that you schedule a follow-up appointment in: 1 month  Any Other Special Instructions Will Be Listed Below (If Applicable).     If you need a refill on your cardiac medications before your next appointment, please call your pharmacy.

## 2017-09-20 NOTE — Progress Notes (Signed)
Cardiology Office Note:    Date:  09/20/2017   ID:  [name], DOB 1956-02-17, MRN [id number]  PCP:  Marton Redwood, MD  Cardiologist:  Shirlee More, MD    Referring MD: Marton Redwood, MD    ASSESSMENT:    1. Angina pectoris ([place])   2. Coronary artery disease, non-occlusive   3. Essential hypertension   4. RBBB   5. Mixed hyperlipidemia    PLAN:    In order of problems listed above:  1. He continues to be quite symptomatic with rest and exertional angina under a great deal of stress tachycardic at rest and will alter his medical therapy to use of beta-blocker and switch from ARB to calcium channel blocker that I think will mitigate his symptoms of angina.  I reassured him that he does not have obstructive CAD.  I asked him to come back in 1 month to assess his response.  I asked him to consider cardiac rehabilitation for angina if he is unable to get back to a regular activity and exercise program 2. Medical therapy continue aspirin beta-blocker statin Although his blood pressures at target he is having frequent angina and he will alter medications as described Stable pattern on today's EKG Continue his statin   Next appointment: One month to assess his response   Medication Adjustments/Labs and Tests Ordered: Current medicines are reviewed at length with the patient today.  Concerns regarding medicines are outlined above.  Orders Placed This Encounter  Procedures  . EKG 12-Lead   Meds ordered this encounter  Medications  . metoprolol succinate (TOPROL XL) 50 MG 24 hr tablet    Sig: Take 1 tablet (50 mg total) by mouth daily. Take with or immediately following a meal.    Dispense:  30 tablet    Refill:  3  . amLODipine (NORVASC) 5 MG tablet    Sig: Take 1 tablet (5 mg total) by mouth daily.    Dispense:  30 tablet    Refill:  3    No chief complaint on file.   History of Present Illness:    ALDRED MASE is a 62 y.o. male with a hx of exertional angina,  right bundle branch block, hypertension, and hyperlipidemia last seen by me 06/15/2017.  Cardiac CTA suggested flow-limiting stenosis and he underwent coronary angiography this month which showed no obstructive CAD but evidence of diffuse coronary artery calcification.. Compliance with diet, lifestyle and medications: yes Past Medical History:  Diagnosis Date  . Adenomatous colon polyp 2001  . Allergic rhinitis   . ALLERGIC RHINITIS 05/18/2007   Qualifier: Diagnosis of  By: Ronnald Ramp RN, CGRN, Sheri    . Allergy   . Anxiety   . Arthritis    arthritis-hands  . Asthma   . ASTHMA 05/18/2007   Qualifier: Diagnosis of  By: Ronnald Ramp RN, CGRN, Sheri    . Bipolar 1 disorder ([place])   . CAD (coronary artery disease) 10/08/2014  . Cancer ([place])    hx skin cancer  . Chest tightness   . Chronic fatigue syndrome   . Chronic headaches    d/t old neck injury  . Complication of anesthesia    x1 episode "panic attack" awakening-none in recent years  . Coronary artery disease   . Depression   . DEPRESSION 05/18/2007   Qualifier: Diagnosis of  By: Ronnald Ramp RN, CGRN, Sheri    . Diverticulosis   . Dysrhythmia    was told benign by cardiologist  . GASTROESOPHAGEAL  REFLUX DISEASE 05/18/2007   Qualifier: Diagnosis of  By: Ronnald Ramp RN, CGRN, Sheri    . GERD (gastroesophageal reflux disease)   . HEADACHE, CHRONIC 05/18/2007   Qualifier: Diagnosis of  By: Ronnald Ramp RN, CGRN, Sheri    . Heart murmur    benign   . History of degenerative disc disease   . HOH (hard of hearing)    slight-bilateral  . Hypercholesterolemia   . Hyperlipidemia 05/18/2007   Qualifier: Diagnosis of  By: Ronnald Ramp RN, CGRN, Sheri    . IBS (irritable bowel syndrome)   . Internal and external hemorrhoids without complication   . Internal hemorrhoids with other complication 05/12/9022  . Irritable bowel syndrome 05/18/2007   Qualifier: Diagnosis of  By: Ronnald Ramp RN, CGRN, Sheri    . Lyme disease   . Personal history of adenomatous colonic polyps 05/18/2007    2001, 2004, 2006, 2009 (last with one small adenoma) Max 4 polyps in past with largest 7 mm (both in 2004)  09/11/2013 7 small polyps adenomas  02/2017 5 adeoimas max 10 mm repeat colon 03/2020    . PONV (postoperative nausea and vomiting)   . Prostatitis   . PROSTATITIS, CHRONIC 05/18/2007   Qualifier: Diagnosis of  By: Ronnald Ramp RN, CGRN, Sheri    . Radiculopathy of cervical spine   . Sleep apnea    better s/p UPP-no problems now  . Stiff neck   . Syncope    last episode 1 yr ago-evaluated by cardiologist-negative findings-?related to low B/p (hydration tends to help).  . Testicular hypofunction   . Vitamin D deficiency     Past Surgical History:  Procedure Laterality Date  . [place]  . CARDIAC CATHETERIZATION  05/2008   negative  . CHOLECYSTECTOMY N/A 10/08/2014   Procedure: LAPAROSCOPIC CHOLECYSTECTOMY WITH INTRAOPERATIVE CHOLANGIOGRAM;  Surgeon: Erroll Luna, MD;  Location: [place];  Service: General;  Laterality: N/A;  . COLONOSCOPY    . COLONOSCOPY W/ BIOPSIES  multiple  . ESOPHAGOGASTRODUODENOSCOPY  multiple  . GREEN LIGHT LASER TURP (TRANSURETHRAL RESECTION OF PROSTATE N/A 06/01/2012   Procedure: GREEN LIGHT LASER OF PROSTATE ;  Surgeon: Ailene Rud, MD;  Location: [facility];  Service: Urology;  Laterality: N/A;  . 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 LEFT HEART CATH AND CORONARY ANGIOGRAPHY N/A 09/12/2017   Procedure: LEFT HEART CATH AND CORONARY ANGIOGRAPHY;  Surgeon: Leonie Man, MD;  Location: [facility];  Service: Cardiovascular;  Laterality: N/A;  . NASAL POLYP EXCISION    . POLYPECTOMY    . TONSILLECTOMY    . UPPER GASTROINTESTINAL ENDOSCOPY    . UVULOPALATOPHARYNGOPLASTY  1990    Current Medications: Current Meds  Medication Sig  . albuterol (PROVENTIL HFA;VENTOLIN HFA) 108 (90 Base) MCG/ACT inhaler Inhale 2 puffs into the lungs every 6 (six) hours as needed for wheezing or shortness of breath.  . AMBULATORY NON  FORMULARY MEDICATION Take 2 tablets by mouth at bedtime. Serotrex  . AMBULATORY NON FORMULARY MEDICATION Take 1 tablet by mouth at bedtime. P5P  . amphetamine-dextroamphetamine (ADDERALL XR) 20 MG 24 hr capsule Take 40 mg by mouth every morning.   Marland Kitchen aspirin 81 MG tablet Take 81 mg by mouth at bedtime.   . Cholecalciferol (VITAMIN D) 2000 UNITS tablet Take 6,000 Units by mouth daily.   . clotrimazole-betamethasone (LOTRISONE) cream Apply 1 application topically 2 (two) times daily as needed (for psoriasis).  . Coenzyme Q10 (CO Q-10) 100 MG CAPS Take  100 mg by mouth daily.  . colestipol (COLESTID) 5 g granules Take 7.5 g by mouth daily with supper.   . Cyanocobalamin (VITAMIN B 12 PO) Take 5,000 mcg by mouth at bedtime.   Marland Kitchen DHEA 25 MG CAPS Take 25 mg by mouth daily.   Marland Kitchen escitalopram (LEXAPRO) 5 MG tablet Take 5 mg by mouth at bedtime.   . fexofenadine (ALLEGRA) 180 MG tablet Take 180 mg by mouth at bedtime.   . Glucosamine HCl (GLUCOSAMINE PO) Take 1,100 mg by mouth daily.  Marland Kitchen L-Lysine 1000 MG TABS Take 1,000 mg by mouth 2 (two) times daily.   . Melatonin 10 MG TABS Take 10 mg by mouth at bedtime.   . Multiple Vitamin (MULTIVITAMIN) tablet Take 1 tablet by mouth daily.  . nitroGLYCERIN (NITROSTAT) 0.4 MG SL tablet Place 1 tablet (0.4 mg total) under the tongue every 5 (five) minutes as needed for chest pain.  . Omega-3 Fatty Acids (FISH OIL) 1000 MG CAPS Take 2,000 mg by mouth 2 (two) times daily.   Earney Navy Bicarbonate (ZEGERID) 20-1100 MG CAPS capsule Take 1 capsule by mouth 2 (two) times daily.   Marland Kitchen OVER THE COUNTER MEDICATION Take 100 mg by mouth daily. Pregnenolone supplement  . OVER THE COUNTER MEDICATION Take 200 mg by mouth daily. Sam-e l-methionine LBCore protocol George Hugh, [place]   . Probiotic Product (PROBIOTIC DAILY PO) Take 1 tablet by mouth daily as needed (gut health).   . Psyllium (FIBER) 0.52 G CAPS Take 4.16 g by mouth at bedtime.   Marland Kitchen QUEtiapine (SEROQUEL)  25 MG tablet Take 75 mg by mouth at bedtime.   . Turmeric 500 MG CAPS Take 500 mg by mouth 2 (two) times daily.   . valACYclovir (VALTREX) 500 MG tablet Take 500 mg by mouth 2 (two) times daily.   . [DISCONTINUED] telmisartan (MICARDIS) 40 MG tablet Take 1 tablet (40 mg total) by mouth daily.     Allergies:   Dilaudid [hydromorphone hcl]; Cymbalta [duloxetine hcl]; Ibuprofen; and Penicillins   Social History   Socioeconomic History  . Marital status: Married    Spouse name: Not on file  . Number of children: 2  . Years of education: Not on file  . Highest education level: Not on file  Occupational History  . Occupation: Designer, jewellery: [employer]  Social Needs  . Financial resource strain: Not on file  . Food insecurity:    Worry: Not on file    Inability: Not on file  . Transportation needs:    Medical: Not on file    Non-medical: Not on file  Tobacco Use  . Smoking status: Former Smoker    Packs/day: 0.50    Years: 7.00    Pack years: 3.50    Types: Cigarettes    Last attempt to quit: 07/06/1980    Years since quitting: 37.2  . Smokeless tobacco: Never Used  Substance and Sexual Activity  . Alcohol use: No  . Drug use: No  . Sexual activity: Yes  Lifestyle  . Physical activity:    Days per week: Not on file    Minutes per session: Not on file  . Stress: Not on file  Relationships  . Social connections:    Talks on phone: Not on file    Gets together: Not on file    Attends religious service: Not on file    Active member of club or organization: Not on file  Attends meetings of clubs or organizations: Not on file    Relationship status: Not on file  Other Topics Concern  . Not on file  Social History Narrative  . Not on file     Family History: The patient's family history includes Alcoholism in his unknown relative; Breast cancer (age of onset: 51) in his mother; CAD in his brother and brother; Colon polyps in his unknown relative; Heart  disease in his unknown relative; Heart failure (age of onset: 65) in his father. There is no history of Colon cancer, Esophageal cancer, Rectal cancer, or Stomach cancer. ROS:   Please see the history of present illness.    All other systems reviewed and are negative.  EKGs/Labs/Other Studies Reviewed:    The following studies were reviewed today:  EKG:  EKG ordered today.  The ekg ordered today demonstrates sinus rhythm right bundle branch block no ischemic changes  Recent Labs: 05/26/2017: B Natriuretic Peptide 18.1 09/12/2017: BUN 17; Creatinine, Ser 1.24; Hemoglobin 14.5; Platelets 209; Potassium 4.0; Sodium 140  Recent Lipid Panel    Component Value Date/Time   CHOL 237 (H) 07/18/2011 1506   TRIG 124.0 07/18/2011 1506   HDL 50.70 07/18/2011 1506   CHOLHDL 5 07/18/2011 1506   VLDL 24.8 07/18/2011 1506   LDLDIRECT 180.6 07/18/2011 1506    Physical Exam:    VS:  BP 128/82 (BP Location: Right Arm, Patient Position: Sitting, Cuff Size: Normal)   Pulse (!) 104   Ht 6\' 1"  (1.854 m)   Wt 235 lb 12.8 oz (107 kg)   SpO2 95%   BMI 31.11 kg/m     Wt Readings from Last 3 Encounters:  09/20/17 235 lb 12.8 oz (107 kg)  09/12/17 235 lb (106.6 kg)  08/29/17 239 lb 12.8 oz (108.8 kg)     GEN:  Well nourished, well developed in no acute distress HEENT: Normal NECK: No JVD; No carotid bruits LYMPHATICS: No lymphadenopathy CARDIAC: RRR, no murmurs, rubs, gallops RESPIRATORY:  Clear to auscultation without rales, wheezing or rhonchi  ABDOMEN: Soft, non-tender, non-distended MUSCULOSKELETAL:  No edema; No deformity  SKIN: Warm and dry NEUROLOGIC:  Alert and oriented x 3 PSYCHIATRIC:  Normal affect    Signed, Shirlee More, MD  09/20/2017 2:28 PM    [place]

## 2017-09-25 ENCOUNTER — Ambulatory Visit: Payer: BLUE CROSS/BLUE SHIELD | Admitting: Cardiology

## 2017-10-19 NOTE — Progress Notes (Signed)
Cardiology Office Note:    Date:  10/21/2017   ID:  [name], DOB Nov 17, 1955, MRN [id number]  PCP:  Marton Redwood, MD  Cardiologist:  Shirlee More, MD    Referring MD: Marton Redwood, MD    ASSESSMENT:    1. Angina pectoris ([place])   2. Coronary artery disease, non-occlusive   3. Essential hypertension   4. Uncomplicated asthma, unspecified asthma severity, unspecified whether persistent   5. Mixed hyperlipidemia    PLAN:    In order of problems listed above:  1. Improved he has angina with minimal coronary atherosclerosis and responded nicely to medical therapy. 2. Stable see above 3. Appears to be improved to close to baseline, I encouraged him to get a blood pressure cuff at home and intermittently screen his home blood pressure and continue his current medications. 4. Worsened I asked him to start using his bronchodilators several times a day 5. Stable continue statin   Next appointment: 6 months at the request of the patient and wife   Medication Adjustments/Labs and Tests Ordered: Current medicines are reviewed at length with the patient today.  Concerns regarding medicines are outlined above.  No orders of the defined types were placed in this encounter.  Meds ordered this encounter  Medications  . albuterol (PROVENTIL HFA;VENTOLIN HFA) 108 (90 Base) MCG/ACT inhaler    Sig: Inhale 2 puffs into the lungs 2 (two) times daily.    Dispense:  1 Inhaler    Refill:  3    Chief Complaint  Patient presents with  . Chest Pain  . Hypertension    History of Present Illness:    Aaron Stout is a 62 y.o. male with a hx of exertional angina, right bundle branch block, hypertension, and hyperlipidemia last seen by me 06/15/2017.  Cardiac CTA suggested flow-limiting stenosis and he underwent coronary angiography this month which showed no obstructive CAD but evidence of diffuse coronary artery calcification who was  last seen 09/20/17. Compliance with diet, lifestyle  and medications: yes  Is a difficult visit to get through his anxiety to his cardiovascular symptoms.  Initially tells me he is not  improved however subsequently he relates he is having no anginal discomfort.  He is concerned because he finds himself breathless when he is eating speaking but also tells me that he is wheezing and has not been using his bronchodilator.  No palpitations syncope or TIA.  Unfortunately the staff did his blood pressure with a small cuff initially high number that heightened his anxiety.  I spoke with him and his wife at length I reviewed the benign nature of his cardiovascular disease is good response to medical therapy and my perspective that his shortness of breath is related to his anxiety and likely an element of untreated asthma.  I asked him to start using his bronchodilator several times a day as opposed to doing further evaluation including PFTs high-resolution CT scan etc.  Both he and his wife responded very well to this.  We discussed whether I should see him back in the office as needed and they prefer to come back every 6 months and he relates that at the end his quality of life is improved. Past Medical History:  Diagnosis Date  . Adenomatous colon polyp 2001  . Allergic rhinitis   . ALLERGIC RHINITIS 05/18/2007   Qualifier: Diagnosis of  By: Ronnald Ramp RN, CGRN, Sheri    . Allergy   . Anxiety   . Arthritis  arthritis-hands  . Asthma   . ASTHMA 05/18/2007   Qualifier: Diagnosis of  By: Ronnald Ramp RN, CGRN, Sheri    . Bipolar 1 disorder ([place])   . CAD (coronary artery disease) 10/08/2014  . Cancer ([place])    hx skin cancer  . Chest tightness   . Chronic fatigue syndrome   . Chronic headaches    d/t old neck injury  . Complication of anesthesia    x1 episode "panic attack" awakening-none in recent years  . Coronary artery disease   . Depression   . DEPRESSION 05/18/2007   Qualifier: Diagnosis of  By: Ronnald Ramp RN, CGRN, Sheri    . Diverticulosis   . Dysrhythmia     was told benign by cardiologist  . GASTROESOPHAGEAL REFLUX DISEASE 05/18/2007   Qualifier: Diagnosis of  By: Ronnald Ramp RN, Holly, [place]    . GERD (gastroesophageal reflux disease)   . HEADACHE, CHRONIC 05/18/2007   Qualifier: Diagnosis of  By: Ronnald Ramp RN, CGRN, Sheri    . Heart murmur    benign   . History of degenerative disc disease   . HOH (hard of hearing)    slight-bilateral  . Hypercholesterolemia   . Hyperlipidemia 05/18/2007   Qualifier: Diagnosis of  By: Ronnald Ramp RN, CGRN, Sheri    . IBS (irritable bowel syndrome)   . Internal and external hemorrhoids without complication   . Internal hemorrhoids with other complication 04/12/7122  . Irritable bowel syndrome 05/18/2007   Qualifier: Diagnosis of  By: Ronnald Ramp RN, CGRN, Sheri    . Lyme disease   . Personal history of adenomatous colonic polyps 05/18/2007   2001, 2004, 2006, 2009 (last with one small adenoma) Max 4 polyps in past with largest 7 mm (both in 2004)  09/11/2013 7 small polyps adenomas  02/2017 5 adeoimas max 10 mm repeat colon 03/2020    . PONV (postoperative nausea and vomiting)   . Prostatitis   . PROSTATITIS, CHRONIC 05/18/2007   Qualifier: Diagnosis of  By: Ronnald Ramp RN, CGRN, Sheri    . Radiculopathy of cervical spine   . Sleep apnea    better s/p UPP-no problems now  . Stiff neck   . Syncope    last episode 1 yr ago-evaluated by cardiologist-negative findings-?related to low B/p (hydration tends to help).  . Testicular hypofunction   . Vitamin D deficiency     Past Surgical History:  Procedure Laterality Date  . [place]  . CARDIAC CATHETERIZATION  05/2008   negative  . CHOLECYSTECTOMY N/A 10/08/2014   Procedure: LAPAROSCOPIC CHOLECYSTECTOMY WITH INTRAOPERATIVE CHOLANGIOGRAM;  Surgeon: Erroll Luna, MD;  Location: [facility];  Service: General;  Laterality: N/A;  . COLONOSCOPY    . COLONOSCOPY W/ BIOPSIES  multiple  . ESOPHAGOGASTRODUODENOSCOPY  multiple  . GREEN LIGHT LASER TURP (TRANSURETHRAL RESECTION OF  PROSTATE N/A 06/01/2012   Procedure: GREEN LIGHT LASER OF PROSTATE ;  Surgeon: Ailene Rud, MD;  Location: [facility];  Service: Urology;  Laterality: N/A;  . 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 LEFT HEART CATH AND CORONARY ANGIOGRAPHY N/A 09/12/2017   Procedure: LEFT HEART CATH AND CORONARY ANGIOGRAPHY;  Surgeon: Leonie Man, MD;  Location: [facility];  Service: Cardiovascular;  Laterality: N/A;  . NASAL POLYP EXCISION    . POLYPECTOMY    . TONSILLECTOMY    . UPPER GASTROINTESTINAL ENDOSCOPY    . UVULOPALATOPHARYNGOPLASTY  1990    Current Medications: Current Meds  Medication Sig  .  albuterol (PROVENTIL HFA;VENTOLIN HFA) 108 (90 Base) MCG/ACT inhaler Inhale 2 puffs into the lungs 2 (two) times daily.  . AMBULATORY NON FORMULARY MEDICATION Take 2 tablets by mouth at bedtime. Serotrex  . AMBULATORY NON FORMULARY MEDICATION Take 1 tablet by mouth at bedtime. P5P  . amLODipine (NORVASC) 5 MG tablet Take 1 tablet (5 mg total) by mouth daily.  Marland Kitchen amphetamine-dextroamphetamine (ADDERALL XR) 20 MG 24 hr capsule Take 40 mg by mouth every morning.   Marland Kitchen aspirin 81 MG tablet Take 81 mg by mouth at bedtime.   . Cholecalciferol (VITAMIN D) 2000 UNITS tablet Take 6,000 Units by mouth daily.   . clotrimazole-betamethasone (LOTRISONE) cream Apply 1 application topically 2 (two) times daily as needed (for psoriasis).  . Coenzyme Q10 (CO Q-10) 100 MG CAPS Take 100 mg by mouth daily.  . colestipol (COLESTID) 5 g granules Take 7.5 g by mouth daily with supper.   . Cyanocobalamin (VITAMIN B 12 PO) Take 5,000 mcg by mouth at bedtime.   Marland Kitchen DHEA 25 MG CAPS Take 25 mg by mouth daily.   Marland Kitchen escitalopram (LEXAPRO) 5 MG tablet Take 5 mg by mouth at bedtime.   . fexofenadine (ALLEGRA) 180 MG tablet Take 180 mg by mouth at bedtime.   . Glucosamine HCl (GLUCOSAMINE PO) Take 1,100 mg by mouth daily.  Marland Kitchen L-Lysine 1000 MG TABS Take 1,000 mg by mouth 2 (two) times daily.   . Melatonin 10 MG  TABS Take 10 mg by mouth at bedtime.   . metoprolol succinate (TOPROL XL) 50 MG 24 hr tablet Take 1 tablet (50 mg total) by mouth daily. Take with or immediately following a meal.  . Multiple Vitamin (MULTIVITAMIN) tablet Take 1 tablet by mouth daily.  . nitroGLYCERIN (NITROSTAT) 0.4 MG SL tablet Place 1 tablet (0.4 mg total) under the tongue every 5 (five) minutes as needed for chest pain.  . Omega-3 Fatty Acids (FISH OIL) 1000 MG CAPS Take 2,000 mg by mouth 2 (two) times daily.   Earney Navy Bicarbonate (ZEGERID) 20-1100 MG CAPS capsule Take 1 capsule by mouth 2 (two) times daily.   Marland Kitchen OVER THE COUNTER MEDICATION Take 100 mg by mouth daily. Pregnenolone supplement  . OVER THE COUNTER MEDICATION Take 200 mg by mouth daily. Sam-e l-methionine LBCore protocol George Hugh, [place]   . Probiotic Product (PROBIOTIC DAILY PO) Take 1 tablet by mouth daily as needed (gut health).   . Psyllium (FIBER) 0.52 G CAPS Take 4.16 g by mouth at bedtime.   Marland Kitchen QUEtiapine (SEROQUEL) 25 MG tablet Take 75 mg by mouth at bedtime.   . Turmeric 500 MG CAPS Take 500 mg by mouth 2 (two) times daily.   . valACYclovir (VALTREX) 500 MG tablet Take 500 mg by mouth 2 (two) times daily.   . [DISCONTINUED] albuterol (PROVENTIL HFA;VENTOLIN HFA) 108 (90 Base) MCG/ACT inhaler Inhale 2 puffs into the lungs every 6 (six) hours as needed for wheezing or shortness of breath.     Allergies:   Dilaudid [hydromorphone hcl]; Cymbalta [duloxetine hcl]; Ibuprofen; and Penicillins   Social History   Socioeconomic History  . Marital status: Married    Spouse name: Not on file  . Number of children: 2  . Years of education: Not on file  . Highest education level: Not on file  Occupational History  . Occupation: Designer, jewellery: [employer]  Social Needs  . Financial resource strain: Not on file  . Food insecurity:  Worry: Not on file    Inability: Not on file  . Transportation needs:    Medical:  Not on file    Non-medical: Not on file  Tobacco Use  . Smoking status: Former Smoker    Packs/day: 0.50    Years: 7.00    Pack years: 3.50    Types: Cigarettes    Last attempt to quit: 07/06/1980    Years since quitting: 37.3  . Smokeless tobacco: Never Used  Substance and Sexual Activity  . Alcohol use: No  . Drug use: No  . Sexual activity: Yes  Lifestyle  . Physical activity:    Days per week: Not on file    Minutes per session: Not on file  . Stress: Not on file  Relationships  . Social connections:    Talks on phone: Not on file    Gets together: Not on file    Attends religious service: Not on file    Active member of club or organization: Not on file    Attends meetings of clubs or organizations: Not on file    Relationship status: Not on file  Other Topics Concern  . Not on file  Social History Narrative  . Not on file     Family History: The patient's family history includes Alcoholism in his unknown relative; Breast cancer (age of onset: 71) in his mother; CAD in his brother and brother; Colon polyps in his unknown relative; Heart disease in his unknown relative; Heart failure (age of onset: 71) in his father. There is no history of Colon cancer, Esophageal cancer, Rectal cancer, or Stomach cancer. ROS:   Please see the history of present illness.    All other systems reviewed and are negative.  EKGs/Labs/Other Studies Reviewed:    The following studies were reviewed today:  Recent Labs: 05/26/2017: B Natriuretic Peptide 18.1 09/12/2017: BUN 17; Creatinine, Ser 1.24; Hemoglobin 14.5; Platelets 209; Potassium 4.0; Sodium 140  Recent Lipid Panel    Component Value Date/Time   CHOL 237 (H) 07/18/2011 1506   TRIG 124.0 07/18/2011 1506   HDL 50.70 07/18/2011 1506   CHOLHDL 5 07/18/2011 1506   VLDL 24.8 07/18/2011 1506   LDLDIRECT 180.6 07/18/2011 1506    Physical Exam:    VS:  BP (!) 162/76 (BP Location: Right Arm, Patient Position: Sitting, Cuff Size:  Normal)   Pulse 79   Ht 6\' 1"  (1.854 m)   Wt 236 lb (107 kg)   SpO2 98%   BMI 31.14 kg/m     Wt Readings from Last 3 Encounters:  10/20/17 236 lb (107 kg)  09/20/17 235 lb 12.8 oz (107 kg)  09/12/17 235 lb (106.6 kg)     GEN: Anxious well nourished, well developed in no acute distress HEENT: Normal NECK: No JVD; No carotid bruits LYMPHATICS: No lymphadenopathy CARDIAC: RRR, no murmurs, rubs, gallops RESPIRATORY:  Clear to auscultation without rales, wheezing or rhonchi  ABDOMEN: Soft, non-tender, non-distended MUSCULOSKELETAL:  No edema; No deformity  SKIN: Warm and dry NEUROLOGIC:  Alert and oriented x 3 PSYCHIATRIC:  Normal affect    Signed, Shirlee More, MD  10/21/2017 11:16 AM    [place]

## 2017-10-20 ENCOUNTER — Ambulatory Visit (INDEPENDENT_AMBULATORY_CARE_PROVIDER_SITE_OTHER): Payer: No Typology Code available for payment source | Admitting: Cardiology

## 2017-10-20 ENCOUNTER — Encounter: Payer: Self-pay | Admitting: Cardiology

## 2017-10-20 VITALS — BP 144/70 | HR 79 | Ht 73.0 in | Wt 236.0 lb

## 2017-10-20 DIAGNOSIS — I10 Essential (primary) hypertension: Secondary | ICD-10-CM | POA: Diagnosis not present

## 2017-10-20 DIAGNOSIS — I2510 Atherosclerotic heart disease of native coronary artery without angina pectoris: Secondary | ICD-10-CM | POA: Diagnosis not present

## 2017-10-20 DIAGNOSIS — J45909 Unspecified asthma, uncomplicated: Secondary | ICD-10-CM

## 2017-10-20 DIAGNOSIS — E782 Mixed hyperlipidemia: Secondary | ICD-10-CM

## 2017-10-20 DIAGNOSIS — I209 Angina pectoris, unspecified: Secondary | ICD-10-CM | POA: Diagnosis not present

## 2017-10-20 MED ORDER — ALBUTEROL SULFATE HFA 108 (90 BASE) MCG/ACT IN AERS: 2.0000 | INHALATION_SPRAY | Freq: Two times a day (BID) | RESPIRATORY_TRACT | 3 refills | Status: AC

## 2017-10-20 NOTE — Patient Instructions (Signed)
Medication Instructions:  Your physician recommends that you continue on your current medications as directed. Please refer to the Current Medication list given to you today.   Labwork: None  Testing/Procedures: None  Follow-Up: Your physician wants you to follow-up in: 6 months. You will receive a reminder letter in the mail two months in advance. If you don't receive a letter, please call our office to schedule the follow-up appointment.   If you need a refill on your cardiac medications before your next appointment, please call your pharmacy.   Thank you for choosing [facility]! Catherine Lockhart, RN [phone]    

## 2017-10-21 ENCOUNTER — Encounter: Payer: Self-pay | Admitting: Cardiology

## 2017-12-11 ENCOUNTER — Telehealth: Payer: Self-pay

## 2017-12-11 MED ORDER — METOPROLOL SUCCINATE ER 50 MG PO TB24: 50.0000 mg | ORAL_TABLET | Freq: Every day | ORAL | 3 refills | Status: DC

## 2017-12-11 NOTE — Telephone Encounter (Signed)
Rx sent to pharmacy as requested.

## 2017-12-19 ENCOUNTER — Telehealth: Payer: Self-pay | Admitting: Cardiology

## 2017-12-19 NOTE — Telephone Encounter (Signed)
Patient called stating [facility]) had not received refill request for his Amlodipine 5mg . He would also like a nurse call regarding some symptoms he is having.  He can reached at [phone]

## 2017-12-20 MED ORDER — AMLODIPINE BESYLATE 5 MG PO TABS: 5.0000 mg | ORAL_TABLET | Freq: Every day | ORAL | 4 refills | Status: DC

## 2017-12-20 NOTE — Telephone Encounter (Signed)
Left message for patient to return call on cell phone per DPR. Sent ref[facility]ine to [facility] [address] in [place] as requested.

## 2017-12-29 NOTE — Telephone Encounter (Signed)
Attempted to contact patient yesterday with no answer. Left message for patient to return call if he still has questions and concerns today.

## 2018-01-03 DIAGNOSIS — F4310 Post-traumatic stress disorder, unspecified: Secondary | ICD-10-CM | POA: Insufficient documentation

## 2018-01-18 ENCOUNTER — Ambulatory Visit (INDEPENDENT_AMBULATORY_CARE_PROVIDER_SITE_OTHER): Payer: PRIVATE HEALTH INSURANCE | Admitting: Psychiatry

## 2018-01-18 ENCOUNTER — Encounter: Payer: Self-pay | Admitting: Psychiatry

## 2018-01-18 DIAGNOSIS — F900 Attention-deficit hyperactivity disorder, predominantly inattentive type: Secondary | ICD-10-CM | POA: Diagnosis not present

## 2018-01-18 DIAGNOSIS — F4310 Post-traumatic stress disorder, unspecified: Secondary | ICD-10-CM | POA: Diagnosis not present

## 2018-01-18 DIAGNOSIS — F411 Generalized anxiety disorder: Secondary | ICD-10-CM

## 2018-01-18 DIAGNOSIS — F5105 Insomnia due to other mental disorder: Secondary | ICD-10-CM

## 2018-01-18 MED ORDER — AMPHETAMINE-DEXTROAMPHET ER 20 MG PO CP24: 20.0000 mg | ORAL_CAPSULE | Freq: Two times a day (BID) | ORAL | 0 refills | Status: DC

## 2018-01-18 NOTE — Progress Notes (Signed)
MYAN SUIT [id number] 1956/01/14 62 y.o.  Subjective:   Patient ID:  Aaron Stout is a 62 y.o. (DOB Jul 24, 1955) male.  Chief Complaint:  Chief Complaint  Patient presents with  . ADHD  . Depression  . Anxiety    HPI Aaron Stout presents to the office today for follow-up of mood and anxiety and sleep. At last visit 8/22 Lexapro was increased from 5 to 7.5mg  dailty for anxiety which is chronic. Thinks the anxiety is a little better with the increase in the Lexapro.  I'm talking about anxiety as much.  Chronic anxiety and stress at work.  Evaluations pending at work.  Less obsessing about that.  Has tried 10mg  Lexapro and he feels it was too much.  Pt reports that mood is Anxious and describes anxiety as Moderate. Anxiety symptoms include: Excessive Worry,. Pt reports no sleep issues. Pt reports that appetite is good. Pt reports that energy is good and good. Concentration is good. Suicidal thoughts:  denied by patient  Mild hangover with Seroquel which helps the sleep..  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  He's aware and tries to catch it.    Review of Systems:  Review of Systems  Respiratory: Positive for chest tightness and shortness of breath.   Neurological: Negative for tremors and weakness.  Psychiatric/Behavioral: Negative for agitation, behavioral problems, confusion, decreased concentration, dysphoric mood, hallucinations, self-injury, sleep disturbance and suicidal ideas. The patient is nervous/anxious. The patient is not hyperactive.     Medications: I have reviewed the patient's current medications.  Current Outpatient Medications  Medication Sig Dispense Refill  . albuterol (PROVENTIL HFA;VENTOLIN HFA) 108 (90 Base) MCG/ACT inhaler Inhale 2 puffs into the lungs 2 (two) times daily. 1 Inhaler 3  . amLODipine (NORVASC) 5 MG tablet Take 1 tablet (5 mg total) by mouth daily. 30 tablet 4  .  amphetamine-dextroamphetamine (ADDERALL XR) 20 MG 24 hr capsule Take 20 mg by mouth 2 (two) times daily.   0  . aspirin 81 MG tablet Take 81 mg by mouth at bedtime.     . Cholecalciferol (VITAMIN D) 2000 UNITS tablet Take 6,000 Units by mouth daily.     . Coenzyme Q10 (CO Q-10) 100 MG CAPS Take 100 mg by mouth daily.    . colestipol (COLESTID) 5 g granules Take 7.5 g by mouth daily with supper.     . Cyanocobalamin (VITAMIN B 12 PO) Take 5,000 mcg by mouth at bedtime.     Marland Kitchen DHEA 25 MG CAPS Take 25 mg by mouth daily.     Marland Kitchen escitalopram (LEXAPRO) 5 MG tablet Take 6.5 mg by mouth daily.     . fexofenadine (ALLEGRA) 180 MG tablet Take 180 mg by mouth at bedtime.     . Glucosamine HCl (GLUCOSAMINE PO) Take 1,100 mg by mouth daily.    Marland Kitchen L-Lysine 1000 MG TABS Take 1,000 mg by mouth 2 (two) times daily.     . Melatonin 10 MG TABS Take 10 mg by mouth at bedtime.     . Multiple Vitamin (MULTIVITAMIN) tablet Take 1 tablet by mouth daily.    Earney Navy Bicarbonate (ZEGERID) 20-1100 MG CAPS capsule Take 1 capsule by mouth 2 (two) times daily.     . QUEtiapine (SEROQUEL) 25 MG tablet Take 75 mg by mouth at bedtime.   0  . Turmeric 500 MG CAPS Take 500 mg by mouth 2 (two)  times daily.     . valACYclovir (VALTREX) 500 MG tablet Take 500 mg by mouth 2 (two) times daily.     . AMBULATORY NON FORMULARY MEDICATION Take 2 tablets by mouth at bedtime. Serotrex    . AMBULATORY NON FORMULARY MEDICATION Take 1 tablet by mouth at bedtime. P5P    . clotrimazole-betamethasone (LOTRISONE) cream Apply 1 application topically 2 (two) times daily as needed (for psoriasis).    . metoprolol succinate (TOPROL XL) 50 MG 24 hr tablet Take 1 tablet (50 mg total) by mouth daily. Take with or immediately following a meal. (Patient not taking: Reported on 01/18/2018) 30 tablet 3  . nitroGLYCERIN (NITROSTAT) 0.4 MG SL tablet Place 1 tablet (0.4 mg total) under the tongue every 5 (five) minutes as needed for chest pain. 25  tablet 11  . Omega-3 Fatty Acids (FISH OIL) 1000 MG CAPS Take 2,000 mg by mouth 2 (two) times daily.     Marland Kitchen OVER THE COUNTER MEDICATION Take 100 mg by mouth daily. Pregnenolone supplement    . OVER THE COUNTER MEDICATION Take 200 mg by mouth daily. Sam-e l-methionine LBCore protocol George Hugh, [place]     . Probiotic Product (PROBIOTIC DAILY PO) Take 1 tablet by mouth daily as needed (gut health).     . Psyllium (FIBER) 0.52 G CAPS Take 4.16 g by mouth at bedtime.      No current facility-administered medications for this visit.     Medication Side Effects: None  Allergies:  Allergies  Allergen Reactions  . Dilaudid [Hydromorphone Hcl] Other (See Comments)    Unknown  . Cymbalta [Duloxetine Hcl] Other (See Comments)    drowsiness  . Ibuprofen Hives  . Penicillins Other (See Comments)    Unknown Has patient had a PCN reaction causing immediate rash, facial/tongue/throat swelling, SOB or lightheadedness with hypotension: Unknown Has patient had a PCN reaction causing severe rash involving mucus membranes or skin necrosis: Unknown Has patient had a PCN reaction that required hospitalization: Unknown Has patient had a PCN reaction occurring within the last 10 years: No If all of the above answers are "NO", then may proceed with Cephalosporin use.     Past Medical History:  Diagnosis Date  . Adenomatous colon polyp 2001  . Allergic rhinitis   . ALLERGIC RHINITIS 05/18/2007   Qualifier: Diagnosis of  By: Ronnald Ramp RN, CGRN, Sheri    . Allergy   . Anxiety   . Arthritis    arthritis-hands  . Asthma   . ASTHMA 05/18/2007   Qualifier: Diagnosis of  By: Ronnald Ramp RN, CGRN, Sheri    . Bipolar 1 disorder ([place])   . CAD (coronary artery disease) 10/08/2014  . Cancer ([place])    hx skin cancer  . Chest tightness   . Chronic fatigue syndrome   . Chronic headaches    d/t old neck injury  . Complication of anesthesia    x1 episode "panic attack" awakening-none in recent years  . Coronary  artery disease   . Depression   . DEPRESSION 05/18/2007   Qualifier: Diagnosis of  By: Ronnald Ramp RN, CGRN, Sheri    . Diverticulosis   . Dysrhythmia    was told benign by cardiologist  . GASTROESOPHAGEAL REFLUX DISEASE 05/18/2007   Qualifier: Diagnosis of  By: Ronnald Ramp RN, [place], [place]    . GERD (gastroesophageal reflux disease)   . HEADACHE, CHRONIC 05/18/2007   Qualifier: Diagnosis of  By: Ronnald Ramp RN, CGRN, Sheri    . Heart murmur  benign   . History of degenerative disc disease   . HOH (hard of hearing)    slight-bilateral  . Hypercholesterolemia   . Hyperlipidemia 05/18/2007   Qualifier: Diagnosis of  By: Ronnald Ramp RN, CGRN, Sheri    . IBS (irritable bowel syndrome)   . Internal and external hemorrhoids without complication   . Internal hemorrhoids with other complication 9/0/2409  . Irritable bowel syndrome 05/18/2007   Qualifier: Diagnosis of  By: Ronnald Ramp RN, CGRN, Sheri    . Lyme disease   . Personal history of adenomatous colonic polyps 05/18/2007   2001, 2004, 2006, 2009 (last with one small adenoma) Max 4 polyps in past with largest 7 mm (both in 2004)  09/11/2013 7 small polyps adenomas  02/2017 5 adeoimas max 10 mm repeat colon 03/2020    . PONV (postoperative nausea and vomiting)   . Prostatitis   . PROSTATITIS, CHRONIC 05/18/2007   Qualifier: Diagnosis of  By: Ronnald Ramp RN, CGRN, Sheri    . Radiculopathy of cervical spine   . Sleep apnea    better s/p UPP-no problems now  . Stiff neck   . Syncope    last episode 1 yr ago-evaluated by cardiologist-negative findings-?related to low B/p (hydration tends to help).  . Testicular hypofunction   . Vitamin D deficiency     Family History  Problem Relation Age of Onset  . Breast cancer Mother 40  . Heart failure Father 53  . Heart disease Unknown        grandfather/uncle  . Alcoholism Unknown        greatgrandfather/uncle    . Colon polyps Unknown        uncle  . CAD Brother   . CAD Brother   . Colon cancer Neg Hx   . Esophageal  cancer Neg Hx   . Rectal cancer Neg Hx   . Stomach cancer Neg Hx     Social History   Socioeconomic History  . Marital status: Married    Spouse name: Not on file  . Number of children: 2  . Years of education: Not on file  . Highest education level: Not on file  Occupational History  . Occupation: Designer, jewellery: [employer]  Social Needs  . Financial resource strain: Not on file  . Food insecurity:    Worry: Not on file    Inability: Not on file  . Transportation needs:    Medical: Not on file    Non-medical: Not on file  Tobacco Use  . Smoking status: Former Smoker    Packs/day: 0.50    Years: 7.00    Pack years: 3.50    Types: Cigarettes    Last attempt to quit: 07/06/1980    Years since quitting: 37.5  . Smokeless tobacco: Never Used  Substance and Sexual Activity  . Alcohol use: No  . Drug use: No  . Sexual activity: Yes  Lifestyle  . Physical activity:    Days per week: Not on file    Minutes per session: Not on file  . Stress: Not on file  Relationships  . Social connections:    Talks on phone: Not on file    Gets together: Not on file    Attends religious service: Not on file    Active member of club or organization: Not on file    Attends meetings of clubs or organizations: Not on file    Relationship status: Not on file  . Intimate  partner violence:    Fear of current or ex partner: Not on file    Emotionally abused: Not on file    Physically abused: Not on file    Forced sexual activity: Not on file  Other Topics Concern  . Not on file  Social History Narrative  . Not on file    Past Medical History, Surgical history, Social history, and Family history were reviewed and updated as appropriate.   Please see review of systems for further details on the patient's review from today.   Objective:   Physical Exam:  There were no vitals taken for this visit.  Physical Exam  Constitutional: He is oriented to person, place, and  time. He appears well-developed. No distress.  Musculoskeletal: He exhibits no deformity.  Neurological: He is alert and oriented to person, place, and time. He displays no tremor. Coordination and gait normal.  Psychiatric: His speech is normal and behavior is normal. Judgment and thought content normal. His mood appears anxious. His affect is not angry, not blunt, not labile and not inappropriate. Cognition and memory are normal. He does not exhibit a depressed mood. He expresses no homicidal and no suicidal ideation. He expresses no suicidal plans and no homicidal plans.  Insight intact. No auditory or visual hallucinations.  Somatic concerns and focus chronically.  Now on SOB.   Get's SOB walking from one building to the next.  Asks questions about this. Somatic focus.  Some neediness.  Lab Review:     Component Value Date/Time   NA 140 09/12/2017 0556   NA 140 09/11/2017 1425   K 4.0 09/12/2017 0556   CL 105 09/12/2017 0556   CO2 29 09/12/2017 0556   GLUCOSE 103 (H) 09/12/2017 0556   BUN 17 09/12/2017 0556   BUN 16 09/11/2017 1425   CREATININE 1.24 09/12/2017 0556   CALCIUM 9.8 09/12/2017 0556   PROT 6.9 09/26/2014 1543   ALBUMIN 3.8 09/26/2014 1543   AST 26 09/26/2014 1543   ALT 28 09/26/2014 1543   ALKPHOS 36 (L) 09/26/2014 1543   BILITOT 0.6 09/26/2014 1543   GFRNONAA >60 09/12/2017 0556   GFRAA >60 09/12/2017 0556       Component Value Date/Time   WBC 8.7 09/12/2017 0556   RBC 4.62 09/12/2017 0556   HGB 14.5 09/12/2017 0556   HGB 15.1 09/11/2017 1425   HCT 43.7 09/12/2017 0556   HCT 43.6 09/11/2017 1425   PLT 209 09/12/2017 0556   PLT 223 09/11/2017 1425   MCV 94.6 09/12/2017 0556   MCV 94 09/11/2017 1425   MCH 31.4 09/12/2017 0556   MCHC 33.2 09/12/2017 0556   RDW 13.0 09/12/2017 0556   RDW 14.1 09/11/2017 1425   LYMPHSABS 2.1 09/26/2014 1543   MONOABS 0.6 09/26/2014 1543   EOSABS 0.5 09/26/2014 1543   BASOSABS 0.0 09/26/2014 1543    No results found  for: POCLITH, LITHIUM   No results found for: PHENYTOIN, PHENOBARB, VALPROATE, CBMZ   .res Assessment: Plan:    PTSD (post-traumatic stress disorder)  Generalized anxiety disorder  Insomnia due to mental condition  Attention deficit hyperactivity disorder (ADHD), predominantly inattentive type   Greater than 50% of face to face time with patient was spent on counseling and coordination of care. We discussed the change and benefit from the increase in the Lexapro for the anxiety.  This is the maximum tolerated dosage at this point.  Suspect residual anxiety is still a portion of what he's dealing with, but he's  failed multiple meds for anxiety.  Disc the last visit and the recommendation that he discuss beta blockers with the cardiologist and their poss benefit for anxiety.  Disc the potential of propranolol to help anxiety more than the metoprolol but it could worsen asthma tendencies if that's what he has.  He plans to see a pulmonologist to evaluate and I agree and emphasize that's the next step.  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  Benefit from the Adderall.  He doesn't want changes in that.  Sleep benefit from the Seroquel is worth it.  This appt 30 mins  FU 3 mos  Lynder Parents, MD, DFAPA     Please see After Visit Summary for patient specific instructions.  No future appointments.  No orders of the defined types were placed in this encounter.     -------------------------------

## 2018-02-12 ENCOUNTER — Other Ambulatory Visit: Payer: Self-pay

## 2018-02-12 MED ORDER — QUETIAPINE FUMARATE 25 MG PO TABS: 75.0000 mg | ORAL_TABLET | Freq: Every day | ORAL | 0 refills | Status: DC

## 2018-02-19 ENCOUNTER — Other Ambulatory Visit: Payer: Self-pay | Admitting: Internal Medicine

## 2018-03-17 ENCOUNTER — Other Ambulatory Visit: Payer: Self-pay | Admitting: Cardiology

## 2018-04-09 ENCOUNTER — Other Ambulatory Visit: Payer: Self-pay | Admitting: Cardiology

## 2018-04-17 ENCOUNTER — Encounter: Payer: Self-pay | Admitting: Psychiatry

## 2018-04-17 ENCOUNTER — Ambulatory Visit (INDEPENDENT_AMBULATORY_CARE_PROVIDER_SITE_OTHER): Payer: PRIVATE HEALTH INSURANCE | Admitting: Psychiatry

## 2018-04-17 VITALS — BP 132/88 | HR 68

## 2018-04-17 DIAGNOSIS — F4310 Post-traumatic stress disorder, unspecified: Secondary | ICD-10-CM | POA: Diagnosis not present

## 2018-04-17 DIAGNOSIS — F411 Generalized anxiety disorder: Secondary | ICD-10-CM | POA: Diagnosis not present

## 2018-04-17 DIAGNOSIS — F5105 Insomnia due to other mental disorder: Secondary | ICD-10-CM | POA: Diagnosis not present

## 2018-04-17 DIAGNOSIS — F900 Attention-deficit hyperactivity disorder, predominantly inattentive type: Secondary | ICD-10-CM | POA: Diagnosis not present

## 2018-04-17 MED ORDER — AMPHETAMINE-DEXTROAMPHET ER 20 MG PO CP24: 20.0000 mg | ORAL_CAPSULE | Freq: Two times a day (BID) | ORAL | 0 refills | Status: DC

## 2018-04-17 NOTE — Progress Notes (Signed)
Aaron Stout [id number] 01/02/1956 63 y.o.  Subjective:   Patient ID:  Aaron Stout is a 63 y.o. (DOB 1956/02/26) male.  Chief Complaint:  Chief Complaint  Patient presents with  . Follow-up    Medication Management  . Anxiety   Last seen January 18, 2018 HPI Aaron Stout presents to the office today for follow-up of mood and anxiety and sleep.  At last visit had residual anxiety and it was suggested he disc with cardiologist the use of a beta blocker that could help anxiety DT multiple failed anxiety meds.  Has been generally good.    At  visit 8/22 Lexapro was increased from 5 to 7.5mg  dailty for anxiety which is chronic. Thinks the anxiety is a little better with the increase in the Lexapro.  I'm talking about anxiety as much.  Chronic anxiety and stress at work.  Evaluations pending at work.  Less obsessing about that.  Has tried 10mg  Lexapro and he feels it was too much.  Pt reports that mood is Anxious and describes anxiety as Moderate. Anxiety symptoms include: Excessive Worry,. Pt reports no sleep issues. Pt reports that appetite is good. Pt reports that energy is good and good. Concentration is good. Suicidal thoughts:  denied by patient  Mild hangover with Seroquel which helps the sleep..  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  He's aware and tries to catch it.  Past Psychiatric Medication Trials: Lexapro 10 SE, Adderall, seroquel low for sleep, perphenazine, Depakote, Zoloft Emsam, Vraylar 1.5, lithium 600, Trileptal, Buspar dizzy, lamotrigine, Belsomra, Ritalin , Concerta, Vyvanse, Welllbutrin   Review of Systems:  Review of Systems  Respiratory: Positive for chest tightness and shortness of breath.   Neurological: Negative for tremors and weakness.  Psychiatric/Behavioral: Negative for agitation, behavioral problems, confusion, decreased concentration, dysphoric mood, hallucinations, self-injury,  sleep disturbance and suicidal ideas. The patient is nervous/anxious. The patient is not hyperactive.     Medications: I have reviewed the patient's current medications.  Current Outpatient Medications  Medication Sig Dispense Refill  . albuterol (PROVENTIL HFA;VENTOLIN HFA) 108 (90 Base) MCG/ACT inhaler Inhale 2 puffs into the lungs 2 (two) times daily. 1 Inhaler 3  . AMBULATORY NON FORMULARY MEDICATION Take 2 tablets by mouth at bedtime. Serotrex    . amLODipine (NORVASC) 5 MG tablet TAKE 1 TABLET BY MOUTH EVERY DAY 90 tablet 1  . amphetamine-dextroamphetamine (ADDERALL XR) 20 MG 24 hr capsule Take 1 capsule (20 mg total) by mouth 2 (two) times daily. 60 capsule 0  . amphetamine-dextroamphetamine (ADDERALL XR) 20 MG 24 hr capsule Take 1 capsule (20 mg total) by mouth 2 (two) times daily. 60 capsule 0  . amphetamine-dextroamphetamine (ADDERALL XR) 20 MG 24 hr capsule Take 1 capsule (20 mg total) by mouth 2 (two) times daily. 60 capsule 0  . aspirin 81 MG tablet Take 81 mg by mouth at bedtime.     . Cholecalciferol (VITAMIN D) 2000 UNITS tablet Take 6,000 Units by mouth daily.     . clotrimazole-betamethasone (LOTRISONE) cream Apply 1 application topically 2 (two) times daily as needed (for psoriasis).    . Coenzyme Q10 (CO Q-10) 100 MG CAPS Take 100 mg by mouth daily.    . colestipol (COLESTID) 5 g granules TAKE 5 G BY MOUTH DAILY WITH SUPPER. 500 g 3  . Cyanocobalamin (VITAMIN B 12 PO) Take 5,000 mcg by mouth at bedtime.     Marland Kitchen  DHEA 25 MG CAPS Take 25 mg by mouth daily.     Marland Kitchen escitalopram (LEXAPRO) 5 MG tablet Take 6.5 mg by mouth daily.     . fexofenadine (ALLEGRA) 180 MG tablet Take 180 mg by mouth at bedtime.     . Glucosamine HCl (GLUCOSAMINE PO) Take 1,100 mg by mouth daily.    Marland Kitchen L-Lysine 1000 MG TABS Take 1,000 mg by mouth 2 (two) times daily.     . Melatonin 10 MG TABS Take 10 mg by mouth at bedtime.     . metoprolol succinate (TOPROL-XL) 50 MG 24 hr tablet TAKE 1 TABLET (50 MG  TOTAL) BY MOUTH DAILY. TAKE WITH OR IMMEDIATELY FOLLOWING A MEAL. 90 tablet 0  . Multiple Vitamin (MULTIVITAMIN) tablet Take 1 tablet by mouth daily.    . Omega-3 Fatty Acids (FISH OIL) 1000 MG CAPS Take 2,000 mg by mouth 2 (two) times daily.     Earney Navy Bicarbonate (ZEGERID) 20-1100 MG CAPS capsule Take 1 capsule by mouth 2 (two) times daily.     Marland Kitchen OVER THE COUNTER MEDICATION Take 100 mg by mouth daily. Pregnenolone supplement    . OVER THE COUNTER MEDICATION Take 200 mg by mouth daily. Sam-e l-methionine LBCore protocol George Hugh, [place]     . Psyllium (FIBER) 0.52 G CAPS Take 4.16 g by mouth at bedtime.     Marland Kitchen QUEtiapine (SEROQUEL) 25 MG tablet Take 3 tablets (75 mg total) by mouth at bedtime. 270 tablet 0  . Turmeric 500 MG CAPS Take 500 mg by mouth 2 (two) times daily.     . valACYclovir (VALTREX) 500 MG tablet Take 500 mg by mouth 2 (two) times daily.     . AMBULATORY NON FORMULARY MEDICATION Take 1 tablet by mouth at bedtime. P5P    . nitroGLYCERIN (NITROSTAT) 0.4 MG SL tablet Place 1 tablet (0.4 mg total) under the tongue every 5 (five) minutes as needed for chest pain. 25 tablet 11  . Probiotic Product (PROBIOTIC DAILY PO) Take 1 tablet by mouth daily as needed (gut health).      No current facility-administered medications for this visit.     Medication Side Effects: None  Allergies:  Allergies  Allergen Reactions  . Dilaudid [Hydromorphone Hcl] Other (See Comments)    Unknown  . Cymbalta [Duloxetine Hcl] Other (See Comments)    drowsiness  . Ibuprofen Hives  . Penicillins Other (See Comments)    Unknown Has patient had a PCN reaction causing immediate rash, facial/tongue/throat swelling, SOB or lightheadedness with hypotension: Unknown Has patient had a PCN reaction causing severe rash involving mucus membranes or skin necrosis: Unknown Has patient had a PCN reaction that required hospitalization: Unknown Has patient had a PCN reaction occurring within  the last 10 years: No If all of the above answers are "NO", then may proceed with Cephalosporin use.     Past Medical History:  Diagnosis Date  . Adenomatous colon polyp 2001  . Allergic rhinitis   . ALLERGIC RHINITIS 05/18/2007   Qualifier: Diagnosis of  By: Ronnald Ramp RN, CGRN, Sheri    . Allergy   . Anxiety   . Arthritis    arthritis-hands  . Asthma   . ASTHMA 05/18/2007   Qualifier: Diagnosis of  By: Ronnald Ramp RN, CGRN, Sheri    . Bipolar 1 disorder ([place])   . CAD (coronary artery disease) 10/08/2014  . Cancer ([place])    hx skin cancer  . Chest tightness   . Chronic fatigue  syndrome   . Chronic headaches    d/t old neck injury  . Complication of anesthesia    x1 episode "panic attack" awakening-none in recent years  . Coronary artery disease   . Depression   . DEPRESSION 05/18/2007   Qualifier: Diagnosis of  By: Ronnald Ramp RN, CGRN, Sheri    . Diverticulosis   . Dysrhythmia    was told benign by cardiologist  . GASTROESOPHAGEAL REFLUX DISEASE 05/18/2007   Qualifier: Diagnosis of  By: Ronnald Ramp RN, [place], [place]    . GERD (gastroesophageal reflux disease)   . HEADACHE, CHRONIC 05/18/2007   Qualifier: Diagnosis of  By: Ronnald Ramp RN, CGRN, Sheri    . Heart murmur    benign   . History of degenerative disc disease   . HOH (hard of hearing)    slight-bilateral  . Hypercholesterolemia   . Hyperlipidemia 05/18/2007   Qualifier: Diagnosis of  By: Ronnald Ramp RN, CGRN, Sheri    . IBS (irritable bowel syndrome)   . Internal and external hemorrhoids without complication   . Internal hemorrhoids with other complication 1/0/6269  . Irritable bowel syndrome 05/18/2007   Qualifier: Diagnosis of  By: Ronnald Ramp RN, CGRN, Sheri    . Lyme disease   . Personal history of adenomatous colonic polyps 05/18/2007   2001, 2004, 2006, 2009 (last with one small adenoma) Max 4 polyps in past with largest 7 mm (both in 2004)  09/11/2013 7 small polyps adenomas  02/2017 5 adeoimas max 10 mm repeat colon 03/2020    . PONV (postoperative  nausea and vomiting)   . Prostatitis   . PROSTATITIS, CHRONIC 05/18/2007   Qualifier: Diagnosis of  By: Ronnald Ramp RN, CGRN, Sheri    . Radiculopathy of cervical spine   . Sleep apnea    better s/p UPP-no problems now  . Stiff neck   . Syncope    last episode 1 yr ago-evaluated by cardiologist-negative findings-?related to low B/p (hydration tends to help).  . Testicular hypofunction   . Vitamin D deficiency     Family History  Problem Relation Age of Onset  . Breast cancer Mother 46  . Heart failure Father 51  . Heart disease Unknown        grandfather/uncle  . Alcoholism Unknown        greatgrandfather/uncle    . Colon polyps Unknown        uncle  . CAD Brother   . CAD Brother   . Colon cancer Neg Hx   . Esophageal cancer Neg Hx   . Rectal cancer Neg Hx   . Stomach cancer Neg Hx     Social History   Socioeconomic History  . Marital status: Married    Spouse name: Not on file  . Number of children: 2  . Years of education: Not on file  . Highest education level: Not on file  Occupational History  . Occupation: Designer, jewellery: [employer]  Social Needs  . Financial resource strain: Not on file  . Food insecurity:    Worry: Not on file    Inability: Not on file  . Transportation needs:    Medical: Not on file    Non-medical: Not on file  Tobacco Use  . Smoking status: Former Smoker    Packs/day: 0.50    Years: 7.00    Pack years: 3.50    Types: Cigarettes    Last attempt to quit: 07/06/1980    Years since quitting: 37.8  .  Smokeless tobacco: Never Used  Substance and Sexual Activity  . Alcohol use: No  . Drug use: No  . Sexual activity: Yes  Lifestyle  . Physical activity:    Days per week: Not on file    Minutes per session: Not on file  . Stress: Not on file  Relationships  . Social connections:    Talks on phone: Not on file    Gets together: Not on file    Attends religious service: Not on file    Active member of club or  organization: Not on file    Attends meetings of clubs or organizations: Not on file    Relationship status: Not on file  . Intimate partner violence:    Fear of current or ex partner: Not on file    Emotionally abused: Not on file    Physically abused: Not on file    Forced sexual activity: Not on file  Other Topics Concern  . Not on file  Social History Narrative  . Not on file    Past Medical History, Surgical history, Social history, and Family history were reviewed and updated as appropriate.   Jake dx bipolar on lithium with tremor.  Answered questions about this.  Please see review of systems for further details on the patient's review from today.   Objective:   Physical Exam:  BP 132/88   Pulse 68   Physical Exam Constitutional:      General: He is not in acute distress.    Appearance: He is well-developed.  Musculoskeletal:        General: No deformity.  Neurological:     Mental Status: He is alert and oriented to person, place, and time.     Motor: No tremor.     Coordination: Coordination normal.     Gait: Gait normal.  Psychiatric:        Mood and Affect: Mood is anxious. Mood is not depressed. Affect is not labile, blunt, angry or inappropriate.        Speech: Speech normal.        Behavior: Behavior normal.        Thought Content: Thought content normal. Thought content does not include homicidal or suicidal ideation. Thought content does not include homicidal or suicidal plan.        Judgment: Judgment normal.     Comments: Insight intact. No auditory or visual hallucinations.  Somatic concerns and focus chronically.  Now on SOB.    Get's SOB walking from one building to the next.  Asks questions about this. Somatic focus.  Some neediness.  Lab Review:     Component Value Date/Time   NA 140 09/12/2017 0556   NA 140 09/11/2017 1425   K 4.0 09/12/2017 0556   CL 105 09/12/2017 0556   CO2 29 09/12/2017 0556   GLUCOSE 103 (H) 09/12/2017 0556   BUN  17 09/12/2017 0556   BUN 16 09/11/2017 1425   CREATININE 1.24 09/12/2017 0556   CALCIUM 9.8 09/12/2017 0556   PROT 6.9 09/26/2014 1543   ALBUMIN 3.8 09/26/2014 1543   AST 26 09/26/2014 1543   ALT 28 09/26/2014 1543   ALKPHOS 36 (L) 09/26/2014 1543   BILITOT 0.6 09/26/2014 1543   GFRNONAA >60 09/12/2017 0556   GFRAA >60 09/12/2017 0556       Component Value Date/Time   WBC 8.7 09/12/2017 0556   RBC 4.62 09/12/2017 0556   HGB 14.5 09/12/2017 0556   HGB 15.1 09/11/2017  1425   HCT 43.7 09/12/2017 0556   HCT 43.6 09/11/2017 1425   PLT 209 09/12/2017 0556   PLT 223 09/11/2017 1425   MCV 94.6 09/12/2017 0556   MCV 94 09/11/2017 1425   MCH 31.4 09/12/2017 0556   MCHC 33.2 09/12/2017 0556   RDW 13.0 09/12/2017 0556   RDW 14.1 09/11/2017 1425   LYMPHSABS 2.1 09/26/2014 1543   MONOABS 0.6 09/26/2014 1543   EOSABS 0.5 09/26/2014 1543   BASOSABS 0.0 09/26/2014 1543    No results found for: POCLITH, LITHIUM   No results found for: PHENYTOIN, PHENOBARB, VALPROATE, CBMZ   .res Assessment: Plan:    PTSD (post-traumatic stress disorder)  Generalized anxiety disorder  Insomnia due to mental condition  Attention deficit hyperactivity disorder (ADHD), predominantly inattentive type   Greater than 50% of face to face time with patient was spent on counseling and coordination of care. We discussed the change and benefit from the increase in the Lexapro for the anxiety.  This is the maximum tolerated dosage at this point.  Suspect residual anxiety is still a portion of what he's dealing with, but he's failed multiple meds for anxiety.  Disc the last visit and the recommendation that he discuss beta blockers with the cardiologist and their poss benefit for anxiety.  Disc the potential of propranolol to help anxiety more than the metoprolol but it could worsen asthma tendencies if that's what he has.  He plans to see a pulmonologist to evaluate and I agree and emphasize that's the next  step.  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  Benefit from the Adderall.  He doesn't want changes in that.  Sleep benefit from the Seroquel is worth it.  This appt 30 mins  FU 3 mos  Lynder Parents, MD, DFAPA     Please see After Visit Summary for patient specific instructions.  No future appointments.  No orders of the defined types were placed in this encounter.     -------------------------------

## 2018-05-07 ENCOUNTER — Other Ambulatory Visit: Payer: Self-pay

## 2018-05-07 MED ORDER — ESCITALOPRAM OXALATE 5 MG PO TABS: 7.5000 mg | ORAL_TABLET | Freq: Every day | ORAL | 1 refills | Status: DC

## 2018-05-07 NOTE — Progress Notes (Unsigned)
Refill request from [facility] for escitalopram 5mg  1.5 tablets daily for 90 day.   Last office visit 04/17/2018 Next office visit 08/2018

## 2018-05-08 ENCOUNTER — Other Ambulatory Visit: Payer: Self-pay | Admitting: Psychiatry

## 2018-06-08 ENCOUNTER — Other Ambulatory Visit: Payer: Self-pay | Admitting: Cardiology

## 2018-07-06 DIAGNOSIS — I77810 Thoracic aortic ectasia: Secondary | ICD-10-CM

## 2018-07-06 HISTORY — DX: Thoracic aortic ectasia: I77.810

## 2018-07-11 ENCOUNTER — Telehealth: Payer: Self-pay | Admitting: Internal Medicine

## 2018-07-11 NOTE — Telephone Encounter (Signed)
Patient called said if he can get an increase for  colestipol (COLESTID) 5 g granules. Patient said he is currently out. Patient said to give him a call when it has been sent

## 2018-07-11 NOTE — Telephone Encounter (Signed)
Patient states he increased his colestipol to 7.5 g daily around a month ago and it has been working really well. He would like to get a refill for this amount. Informed patient he is due for a follow up visit. Dr. Carlean Purl, can we send a refill of colestipol for 7.5 g daily?

## 2018-07-12 ENCOUNTER — Other Ambulatory Visit: Payer: Self-pay | Admitting: Internal Medicine

## 2018-07-12 MED ORDER — COLESTIPOL HCL 5 G PO PACK: 7.5000 g | PACK | Freq: Every day | ORAL | 2 refills | Status: DC

## 2018-07-12 NOTE — Telephone Encounter (Signed)
I resent rx as Dr Carlean Purl approved yesterday for one and a half packets daily of his colestipol. The pharmacy needed [place] to clarify the amount.

## 2018-07-12 NOTE — Telephone Encounter (Signed)
Patient informed. 

## 2018-07-12 NOTE — Telephone Encounter (Signed)
I refilled - did 1.5 packets daily  Please verify that is correct   If he is doing well doesn't have to f/u now but if he wants more refills from me will need to have an appointment then  He can ask Dr. Brigitte Pulse PCP to Rx if desired as if doing well not really necessary to see me

## 2018-07-17 ENCOUNTER — Other Ambulatory Visit (HOSPITAL_COMMUNITY): Payer: Self-pay | Admitting: Respiratory Therapy

## 2018-07-17 DIAGNOSIS — R0600 Dyspnea, unspecified: Secondary | ICD-10-CM

## 2018-07-17 DIAGNOSIS — R0609 Other forms of dyspnea: Secondary | ICD-10-CM

## 2018-07-24 ENCOUNTER — Other Ambulatory Visit: Payer: Self-pay | Admitting: Internal Medicine

## 2018-07-24 ENCOUNTER — Telehealth: Payer: Self-pay

## 2018-07-24 DIAGNOSIS — R911 Solitary pulmonary nodule: Secondary | ICD-10-CM

## 2018-07-24 NOTE — Telephone Encounter (Signed)
Prior authorization submitted for Amphetamine-dextroamphetamine 20 mg ER through [employer] for 1 bid, denied for bid dosing approved for 1/day dosing. Denied last year 2019 as well. 1/day dosing approved through 07/2019

## 2018-07-25 ENCOUNTER — Telehealth: Payer: Self-pay | Admitting: Psychiatry

## 2018-07-25 ENCOUNTER — Other Ambulatory Visit: Payer: Self-pay

## 2018-07-25 DIAGNOSIS — F900 Attention-deficit hyperactivity disorder, predominantly inattentive type: Secondary | ICD-10-CM

## 2018-07-25 MED ORDER — AMPHETAMINE-DEXTROAMPHET ER 20 MG PO CP24: 20.0000 mg | ORAL_CAPSULE | Freq: Two times a day (BID) | ORAL | 0 refills | Status: DC

## 2018-07-25 NOTE — Telephone Encounter (Signed)
[name] called to request refill of his Adderall.  Next appt 08/14/18.  [facility] [address]

## 2018-07-25 NOTE — Telephone Encounter (Signed)
Pended for approval.

## 2018-07-26 ENCOUNTER — Other Ambulatory Visit: Payer: Self-pay | Admitting: Psychiatry

## 2018-07-26 ENCOUNTER — Telehealth: Payer: Self-pay | Admitting: Psychiatry

## 2018-07-26 DIAGNOSIS — F900 Attention-deficit hyperactivity disorder, predominantly inattentive type: Secondary | ICD-10-CM

## 2018-07-26 MED ORDER — AMPHETAMINE-DEXTROAMPHET ER 20 MG PO CP24: 20.0000 mg | ORAL_CAPSULE | Freq: Two times a day (BID) | ORAL | 0 refills | Status: DC

## 2018-07-26 NOTE — Telephone Encounter (Signed)
[facility] stated [facility]der[facility]. Pt requesting at least a 10day supply sent to [facility] until the approval.

## 2018-07-27 ENCOUNTER — Telehealth: Payer: Self-pay

## 2018-07-27 NOTE — Telephone Encounter (Signed)
Resubmitted through updated Insurance [employer] received approval

## 2018-07-27 NOTE — Telephone Encounter (Signed)
Prior authorization submitted for amphetamine-dextroamphetamine ER 20 mg bid dosing through updated insurance [employer] through [place] approved effective 07/27/2018-07/18/2019

## 2018-07-31 ENCOUNTER — Ambulatory Visit
Admission: RE | Admit: 2018-07-31 | Discharge: 2018-07-31 | Disposition: A | Discharge status: Home / Self Care | Payer: No Typology Code available for payment source | Source: Ambulatory Visit | Attending: Internal Medicine | Admitting: Internal Medicine

## 2018-07-31 DIAGNOSIS — R911 Solitary pulmonary nodule: Secondary | ICD-10-CM

## 2018-08-14 ENCOUNTER — Ambulatory Visit: Payer: PRIVATE HEALTH INSURANCE | Admitting: Psychiatry

## 2018-08-14 ENCOUNTER — Encounter: Payer: Self-pay | Admitting: Psychiatry

## 2018-08-14 ENCOUNTER — Other Ambulatory Visit: Payer: Self-pay

## 2018-08-14 DIAGNOSIS — F4310 Post-traumatic stress disorder, unspecified: Secondary | ICD-10-CM | POA: Diagnosis not present

## 2018-08-14 DIAGNOSIS — F900 Attention-deficit hyperactivity disorder, predominantly inattentive type: Secondary | ICD-10-CM

## 2018-08-14 DIAGNOSIS — F411 Generalized anxiety disorder: Secondary | ICD-10-CM | POA: Diagnosis not present

## 2018-08-14 DIAGNOSIS — F5105 Insomnia due to other mental disorder: Secondary | ICD-10-CM | POA: Diagnosis not present

## 2018-08-14 MED ORDER — AMPHETAMINE-DEXTROAMPHET ER 20 MG PO CP24: 20.0000 mg | ORAL_CAPSULE | Freq: Two times a day (BID) | ORAL | 0 refills | Status: DC

## 2018-08-14 NOTE — Progress Notes (Signed)
Aaron Stout [id number] 06-13-1955 63 y.o.  Subjective:   Patient ID:  Aaron Stout is a 63 y.o. (DOB 1955-06-22) male.  Chief Complaint:  Chief Complaint  Patient presents with  . Follow-up    Medication Management  . Depression    Medication Management  . Other    PTSD    Depression         Associated symptoms include no decreased concentration and no suicidal ideas.  Aaron Stout presents to the office today for follow-up of mood and anxiety and sleep.  Last visit February without med changes.    Pretty good overall.  Still a little anxiety hangs on.  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  Not horrible and able to function and keep on going.  Trust in God to provide.  Anticipates  In person classes in the fall.  At last visit had residual anxiety and it was suggested he disc with cardiologist the use of a beta blocker metoprolol and it helped BP and  anxiety DT multiple failed anxiety meds.  Has been generally good.    At  visit 8/22 Lexapro was increased from 5 to 7.5mg  dailty for anxiety which is chronic. Thinks the anxiety is a helpful.  I  Chronic anxiety and stress at work.  Evaluations pending at work.  Less obsessing about that.  Has tried 10mg  Lexapro and he feels it was too much.  Pt reports that mood is Anxious and describes anxiety as Moderate. Anxiety symptoms include: Excessive Worry,. Pt reports no sleep issues. Pt reports that appetite is good. Pt reports that energy is good and good. Concentration is good. Suicidal thoughts:  denied by patient  Mild hangover with Seroquel which helps the sleep. Makes him sleepy in 60-90 mins.  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  He's aware and tries to catch it.  Needs Adderall XR BID for adequate duration.  Past Psychiatric Medication Trials: Lexapro 10 SE, Adderall, seroquel low for sleep, perphenazine, Depakote, Zoloft Emsam, Vraylar 1.5, lithium 600, Trileptal, Buspar  dizzy, lamotrigine, Belsomra, Ritalin , Concerta, Vyvanse, Welllbutrin  Review of Systems:  Review of Systems  Constitutional:       Long history of sweating  Respiratory: Positive for chest tightness and shortness of breath.   Neurological: Negative for tremors and weakness.  Psychiatric/Behavioral: Positive for depression. Negative for agitation, behavioral problems, confusion, decreased concentration, dysphoric mood, hallucinations, self-injury, sleep disturbance and suicidal ideas. The patient is nervous/anxious. The patient is not hyperactive.     Medications: I have reviewed the patient's current medications.  Current Outpatient Medications  Medication Sig Dispense Refill  . albuterol (PROVENTIL HFA;VENTOLIN HFA) 108 (90 Base) MCG/ACT inhaler Inhale 2 puffs into the lungs 2 (two) times daily. 1 Inhaler 3  . AMBULATORY NON FORMULARY MEDICATION Take 2 tablets by mouth at bedtime. Serotrex    . amLODipine (NORVASC) 5 MG tablet TAKE 1 TABLET BY MOUTH EVERY DAY 90 tablet 1  . amphetamine-dextroamphetamine (ADDERALL XR) 20 MG 24 hr capsule Take 1 capsule (20 mg total) by mouth 2 (two) times daily. 60 capsule 0  . [START ON 09/11/2018] amphetamine-dextroamphetamine (ADDERALL XR) 20 MG 24 hr capsule Take 1 capsule (20 mg total) by mouth 2 (two) times daily. 60 capsule 0  . [START ON 10/09/2018] amphetamine-dextroamphetamine (ADDERALL XR) 20 MG 24 hr capsule Take 1 capsule (20 mg total) by mouth 2 (two) times daily. 60 capsule  0  . aspirin 81 MG tablet Take 81 mg by mouth at bedtime.     . Cholecalciferol (VITAMIN D) 2000 UNITS tablet Take 3,000 Units by mouth daily.     . clotrimazole-betamethasone (LOTRISONE) cream Apply 1 application topically 2 (two) times daily as needed (for psoriasis).    . Coenzyme Q10 (CO Q-10) 100 MG CAPS Take 100 mg by mouth daily.    . colestipol (COLESTID) 5 g packet Take 1 and a half packets daily 180 packet 2  . Cyanocobalamin (VITAMIN B 12 PO) Take 5,000 mcg by  mouth at bedtime.     Marland Kitchen DHEA 25 MG CAPS Take 25 mg by mouth daily.     Marland Kitchen escitalopram (LEXAPRO) 5 MG tablet Take 1.5 tablets (7.5 mg total) by mouth daily. 135 tablet 1  . fexofenadine (ALLEGRA) 180 MG tablet Take 180 mg by mouth at bedtime.     . Glucosamine HCl (GLUCOSAMINE PO) Take 1,100 mg by mouth daily.    Marland Kitchen L-Lysine 1000 MG TABS Take 1,000 mg by mouth 2 (two) times daily.     . Melatonin 10 MG TABS Take 10 mg by mouth at bedtime.     . metoprolol succinate (TOPROL-XL) 50 MG 24 hr tablet TAKE 1 TABLET (50 MG TOTAL) BY MOUTH DAILY. TAKE WITH OR IMMEDIATELY FOLLOWING A MEAL. 90 tablet 1  . Multiple Vitamin (MULTIVITAMIN) tablet Take 1 tablet by mouth daily.    . Omega-3 Fatty Acids (FISH OIL) 1000 MG CAPS Take 2,000 mg by mouth 2 (two) times daily.     Earney Navy Bicarbonate (ZEGERID) 20-1100 MG CAPS capsule Take 1 capsule by mouth 2 (two) times daily.     Marland Kitchen OVER THE COUNTER MEDICATION Take 100 mg by mouth daily. Pregnenolone supplement    . OVER THE COUNTER MEDICATION Take 200 mg by mouth daily. Sam-e l-methionine LBCore protocol George Hugh, [place]     . Psyllium (FIBER) 0.52 G CAPS Take 4.16 g by mouth at bedtime.     Marland Kitchen QUEtiapine (SEROQUEL) 25 MG tablet TAKE 3 TABLETS (75 MG TOTAL) BY MOUTH AT BEDTIME. 270 tablet 1  . Turmeric 500 MG CAPS Take 500 mg by mouth 2 (two) times daily.     . valACYclovir (VALTREX) 500 MG tablet Take 500 mg by mouth 2 (two) times daily.      No current facility-administered medications for this visit.     Medication Side Effects: None  Allergies:  Allergies  Allergen Reactions  . Dilaudid [Hydromorphone Hcl] Other (See Comments)    Unknown  . Cymbalta [Duloxetine Hcl] Other (See Comments)    drowsiness  . Ibuprofen Hives  . Penicillins Other (See Comments)    Unknown Has patient had a PCN reaction causing immediate rash, facial/tongue/throat swelling, SOB or lightheadedness with hypotension: Unknown Has patient had a PCN reaction  causing severe rash involving mucus membranes or skin necrosis: Unknown Has patient had a PCN reaction that required hospitalization: Unknown Has patient had a PCN reaction occurring within the last 10 years: No If all of the above answers are "NO", then may proceed with Cephalosporin use.     Past Medical History:  Diagnosis Date  . Adenomatous colon polyp 2001  . Allergic rhinitis   . ALLERGIC RHINITIS 05/18/2007   Qualifier: Diagnosis of  By: Ronnald Ramp RN, CGRN, Sheri    . Allergy   . Anxiety   . Arthritis    arthritis-hands  . Asthma   . ASTHMA 05/18/2007  Qualifier: Diagnosis of  By: Ronnald Ramp RN, [place], [place]    . Bipolar 1 disorder ([place])   . CAD (coronary artery disease) 10/08/2014  . Cancer ([place])    hx skin cancer  . Chest tightness   . Chronic fatigue syndrome   . Chronic headaches    d/t old neck injury  . Complication of anesthesia    x1 episode "panic attack" awakening-none in recent years  . Coronary artery disease   . Depression   . DEPRESSION 05/18/2007   Qualifier: Diagnosis of  By: Ronnald Ramp RN, CGRN, Sheri    . Diverticulosis   . Dysrhythmia    was told benign by cardiologist  . GASTROESOPHAGEAL REFLUX DISEASE 05/18/2007   Qualifier: Diagnosis of  By: Ronnald Ramp RN, [place], [place]    . GERD (gastroesophageal reflux disease)   . HEADACHE, CHRONIC 05/18/2007   Qualifier: Diagnosis of  By: Ronnald Ramp RN, CGRN, Sheri    . Heart murmur    benign   . History of degenerative disc disease   . HOH (hard of hearing)    slight-bilateral  . Hypercholesterolemia   . Hyperlipidemia 05/18/2007   Qualifier: Diagnosis of  By: Ronnald Ramp RN, CGRN, Sheri    . IBS (irritable bowel syndrome)   . Internal and external hemorrhoids without complication   . Internal hemorrhoids with other complication 05/09/3297  . Irritable bowel syndrome 05/18/2007   Qualifier: Diagnosis of  By: Ronnald Ramp RN, CGRN, Sheri    . Lyme disease   . Personal history of adenomatous colonic polyps 05/18/2007   2001, 2004, 2006, 2009  (last with one small adenoma) Max 4 polyps in past with largest 7 mm (both in 2004)  09/11/2013 7 small polyps adenomas  02/2017 5 adeoimas max 10 mm repeat colon 03/2020    . PONV (postoperative nausea and vomiting)   . Prostatitis   . PROSTATITIS, CHRONIC 05/18/2007   Qualifier: Diagnosis of  By: Ronnald Ramp RN, CGRN, Sheri    . Radiculopathy of cervical spine   . Sleep apnea    better s/p UPP-no problems now  . Stiff neck   . Syncope    last episode 1 yr ago-evaluated by cardiologist-negative findings-?related to low B/p (hydration tends to help).  . Testicular hypofunction   . Vitamin D deficiency     Family History  Problem Relation Age of Onset  . Breast cancer Mother 70  . Heart failure Father 83  . Heart disease Unknown        grandfather/uncle  . Alcoholism Unknown        greatgrandfather/uncle    . Colon polyps Unknown        uncle  . CAD Brother   . CAD Brother   . Colon cancer Neg Hx   . Esophageal cancer Neg Hx   . Rectal cancer Neg Hx   . Stomach cancer Neg Hx     Social History   Socioeconomic History  . Marital status: Married    Spouse name: Not on file  . Number of children: 2  . Years of education: Not on file  . Highest education level: Not on file  Occupational History  . Occupation: Designer, jewellery: [employer]  Social Needs  . Financial resource strain: Not on file  . Food insecurity:    Worry: Not on file    Inability: Not on file  . Transportation needs:    Medical: Not on file    Non-medical: Not on file  Tobacco  Use  . Smoking status: Former Smoker    Packs/day: 0.50    Years: 7.00    Pack years: 3.50    Types: Cigarettes    Last attempt to quit: 07/06/1980    Years since quitting: 38.1  . Smokeless tobacco: Never Used  Substance and Sexual Activity  . Alcohol use: No  . Drug use: No  . Sexual activity: Yes  Lifestyle  . Physical activity:    Days per week: Not on file    Minutes per session: Not on file  . Stress:  Not on file  Relationships  . Social connections:    Talks on phone: Not on file    Gets together: Not on file    Attends religious service: Not on file    Active member of club or organization: Not on file    Attends meetings of clubs or organizations: Not on file    Relationship status: Not on file  . Intimate partner violence:    Fear of current or ex partner: Not on file    Emotionally abused: Not on file    Physically abused: Not on file    Forced sexual activity: Not on file  Other Topics Concern  . Not on file  Social History Narrative  . Not on file    Past Medical History, Surgical history, Social history, and Family history were reviewed and updated as appropriate.   Aaron Stout dx bipolar on lithium with tremor.  Answered questions about this.  Please see review of systems for further details on the patient's review from today.   Objective:   Physical Exam:  There were no vitals taken for this visit.  Physical Exam Constitutional:      General: He is not in acute distress.    Appearance: He is well-developed.  Musculoskeletal:        General: No deformity.  Neurological:     Mental Status: He is alert and oriented to person, place, and time.     Motor: No tremor.     Coordination: Coordination normal.     Gait: Gait normal.  Psychiatric:        Mood and Affect: Mood is anxious. Mood is not depressed. Affect is not labile, blunt, angry or inappropriate.        Speech: Speech normal.        Behavior: Behavior normal.        Thought Content: Thought content normal. Thought content does not include homicidal or suicidal ideation. Thought content does not include homicidal or suicidal plan.        Judgment: Judgment normal.     Comments: Insight intact. No auditory or visual hallucinations.  Somatic concerns and focus chronically.  Now on SOB.    Get's SOB walking from one building to the next.  Asks questions about this. Somatic focus.  Some neediness.  Lab  Review:     Component Value Date/Time   NA 140 09/12/2017 0556   NA 140 09/11/2017 1425   K 4.0 09/12/2017 0556   CL 105 09/12/2017 0556   CO2 29 09/12/2017 0556   GLUCOSE 103 (H) 09/12/2017 0556   BUN 17 09/12/2017 0556   BUN 16 09/11/2017 1425   CREATININE 1.24 09/12/2017 0556   CALCIUM 9.8 09/12/2017 0556   PROT 6.9 09/26/2014 1543   ALBUMIN 3.8 09/26/2014 1543   AST 26 09/26/2014 1543   ALT 28 09/26/2014 1543   ALKPHOS 36 (L) 09/26/2014 1543   BILITOT 0.6  09/26/2014 1543   GFRNONAA >60 09/12/2017 0556   GFRAA >60 09/12/2017 0556       Component Value Date/Time   WBC 8.7 09/12/2017 0556   RBC 4.62 09/12/2017 0556   HGB 14.5 09/12/2017 0556   HGB 15.1 09/11/2017 1425   HCT 43.7 09/12/2017 0556   HCT 43.6 09/11/2017 1425   PLT 209 09/12/2017 0556   PLT 223 09/11/2017 1425   MCV 94.6 09/12/2017 0556   MCV 94 09/11/2017 1425   MCH 31.4 09/12/2017 0556   MCHC 33.2 09/12/2017 0556   RDW 13.0 09/12/2017 0556   RDW 14.1 09/11/2017 1425   LYMPHSABS 2.1 09/26/2014 1543   MONOABS 0.6 09/26/2014 1543   EOSABS 0.5 09/26/2014 1543   BASOSABS 0.0 09/26/2014 1543    No results found for: POCLITH, LITHIUM   No results found for: PHENYTOIN, PHENOBARB, VALPROATE, CBMZ   .res Assessment: Plan:    Generalized anxiety disorder  PTSD (post-traumatic stress disorder)  Attention deficit hyperactivity disorder (ADHD), predominantly inattentive type - Plan: amphetamine-dextroamphetamine (ADDERALL XR) 20 MG 24 hr capsule, amphetamine-dextroamphetamine (ADDERALL XR) 20 MG 24 hr capsule, amphetamine-dextroamphetamine (ADDERALL XR) 20 MG 24 hr capsule  Insomnia due to mental condition   Greater than 50% of face to face time with patient was spent on counseling and coordination of care. We discussed the change and benefit from the increase in the Lexapro for the anxiety.  This is the maximum tolerated dosage at this point.  Suspect residual anxiety is still a portion of what he's  dealing with, but he's failed multiple meds for anxiety.  Disc the last visit and the recommendation that he discuss beta blockers with the cardiologist and their poss benefit for anxiety.  Disc the potential of propranolol to help anxiety more than the metoprolol but it could worsen asthma tendencies if that's what he has.  He plans to see a pulmonologist to evaluate and I agree and emphasize that's the next step.  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  Benefit from the Adderall. Satisfied.   He doesn't want changes in that.  Doubt he would get adequate duration with once daily Adderall XR.    Sleep benefit from the Seroquel is worth it.  This appt 30 mins  FU 3 mos  Lynder Parents, MD, DFAPA     Please see After Visit Summary for patient specific instructions.  No future appointments.  No orders of the defined types were placed in this encounter.     -------------------------------

## 2018-09-11 ENCOUNTER — Telehealth: Payer: Self-pay | Admitting: Psychiatry

## 2018-09-11 ENCOUNTER — Other Ambulatory Visit: Payer: Self-pay

## 2018-09-11 MED ORDER — QUETIAPINE FUMARATE 25 MG PO TABS: 75.0000 mg | ORAL_TABLET | Freq: Every day | ORAL | 0 refills | Status: DC

## 2018-09-11 NOTE — Telephone Encounter (Signed)
Submitted for 3 day supply to [facility] at [facility]

## 2018-09-11 NOTE — Telephone Encounter (Signed)
[place] seroquel. He wants to know if he can get a few called in at [place]. They did give him a 3 day emergency supply, but he needs 3 more days.

## 2018-10-03 ENCOUNTER — Other Ambulatory Visit: Payer: Self-pay

## 2018-10-03 MED ORDER — AMLODIPINE BESYLATE 5 MG PO TABS: 5.0000 mg | ORAL_TABLET | Freq: Every day | ORAL | 0 refills | Status: DC

## 2018-10-03 NOTE — Telephone Encounter (Signed)
Rx for Amlodipine 5mg  one tablet daily sent to [facility] as patient was due to follow up in Feb 2020.

## 2018-10-17 ENCOUNTER — Telehealth: Payer: Self-pay | Admitting: Cardiology

## 2018-10-17 MED ORDER — AMLODIPINE BESYLATE 5 MG PO TABS: 5.0000 mg | ORAL_TABLET | Freq: Every day | ORAL | 0 refills | Status: DC

## 2018-10-17 MED ORDER — METOPROLOL SUCCINATE ER 50 MG PO TB24: 50.0000 mg | ORAL_TABLET | Freq: Every day | ORAL | 0 refills | Status: DC

## 2018-10-17 NOTE — Telephone Encounter (Signed)
30 day supply of amlodipine and metoprolol succinate sent to [facility] in [place] as patient is overdue for follow up.

## 2018-10-17 NOTE — Telephone Encounter (Signed)
Refill Amlodipine and Metoprolol

## 2018-10-25 ENCOUNTER — Other Ambulatory Visit: Payer: Self-pay | Admitting: Psychiatry

## 2018-11-28 ENCOUNTER — Other Ambulatory Visit: Payer: Self-pay | Admitting: Psychiatry

## 2018-11-30 ENCOUNTER — Other Ambulatory Visit: Payer: Self-pay

## 2018-11-30 ENCOUNTER — Telehealth: Payer: Self-pay | Admitting: Psychiatry

## 2018-11-30 DIAGNOSIS — F900 Attention-deficit hyperactivity disorder, predominantly inattentive type: Secondary | ICD-10-CM

## 2018-11-30 MED ORDER — AMPHETAMINE-DEXTROAMPHET ER 20 MG PO CP24: 20.0000 mg | ORAL_CAPSULE | Freq: Two times a day (BID) | ORAL | 0 refills | Status: DC

## 2018-11-30 NOTE — Telephone Encounter (Signed)
[name] called to request refill of his Adderall 20 #60.  Appt 10/13.  Send to [facility]

## 2018-11-30 NOTE — Telephone Encounter (Signed)
Pended for approval last refill 10/30/2018

## 2018-12-17 ENCOUNTER — Other Ambulatory Visit: Payer: Self-pay | Admitting: Cardiology

## 2018-12-18 ENCOUNTER — Encounter: Payer: Self-pay | Admitting: Psychiatry

## 2018-12-18 ENCOUNTER — Ambulatory Visit (INDEPENDENT_AMBULATORY_CARE_PROVIDER_SITE_OTHER): Payer: PRIVATE HEALTH INSURANCE | Admitting: Psychiatry

## 2018-12-18 ENCOUNTER — Other Ambulatory Visit: Payer: Self-pay

## 2018-12-18 VITALS — BP 124/74 | HR 67

## 2018-12-18 DIAGNOSIS — F5105 Insomnia due to other mental disorder: Secondary | ICD-10-CM

## 2018-12-18 DIAGNOSIS — F4310 Post-traumatic stress disorder, unspecified: Secondary | ICD-10-CM | POA: Diagnosis not present

## 2018-12-18 DIAGNOSIS — F900 Attention-deficit hyperactivity disorder, predominantly inattentive type: Secondary | ICD-10-CM | POA: Diagnosis not present

## 2018-12-18 DIAGNOSIS — F411 Generalized anxiety disorder: Secondary | ICD-10-CM | POA: Diagnosis not present

## 2018-12-18 MED ORDER — AMPHETAMINE-DEXTROAMPHET ER 20 MG PO CP24: 20.0000 mg | ORAL_CAPSULE | Freq: Two times a day (BID) | ORAL | 0 refills | Status: DC

## 2018-12-18 NOTE — Progress Notes (Signed)
CAYMAN HANTHORN [id number] 1955-07-13 63 y.o.  Subjective:   Patient ID:  Aaron Stout is a 63 y.o. (DOB 12-Mar-1955) male.  Chief Complaint:  Chief Complaint  Patient presents with  . Follow-up    Medication Management  . Anxiety    Medication Management  . Depression    Medication Management  . Other    PTSD    Depression        Associated symptoms include fatigue.  Associated symptoms include no decreased concentration and no suicidal ideas.  [name] presents to the office today for follow-up of mood and anxiety and sleep.  Last visit June 2020 without med changes.    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    Pretty good overall.  No major changes.  Same old chronic tiredness and easily OOB.  Still a little anxiety hangs on.  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  Not horrible and able to function and keep on going.  Trust in God to provide.   At last visit had residual anxiety and it was suggested he disc with cardiologist the use of a beta blocker metoprolol and it helped BP and  anxiety DT multiple failed anxiety meds.  Has been generally good.    At  visit 8/22 Lexapro was increased from 5 to 7.5mg  dailty for anxiety which is chronic. Thinks the anxiety is a helpful.  I  Chronic anxiety and stress at work.  Evaluations pending at work.  Less obsessing about that.  Has tried 10mg  Lexapro and he feels it was too much.  Pt reports that mood is Anxious and describes anxiety as Moderate. Anxiety symptoms include: Excessive Worry,. Pt reports no sleep issues. Pt reports that appetite is good. Pt reports that energy is good and good. Concentration is good. Suicidal thoughts:  denied by patient  Mild hangover with Seroquel which helps the sleep. Makes him sleepy in 60-90 mins.  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  He's aware and tries to catch it.    Past Psychiatric Medication Trials: Lexapro 10 SE, Adderall,  seroquel low for sleep, perphenazine, Depakote, Zoloft Emsam, Vraylar 1.5, lithium 600, Trileptal, Buspar dizzy, lamotrigine, Belsomra, Ritalin , Concerta, Vyvanse, Welllbutrin  Review of Systems:  Review of Systems  Constitutional: Positive for fatigue.       Long history of sweating  Respiratory: Positive for chest tightness and shortness of breath.   Neurological: Negative for tremors and weakness.  Psychiatric/Behavioral: Positive for depression. Negative for agitation, behavioral problems, confusion, decreased concentration, dysphoric mood, hallucinations, self-injury, sleep disturbance and suicidal ideas. The patient is nervous/anxious. The patient is not hyperactive.     Medications: I have reviewed the patient's current medications.  Current Outpatient Medications  Medication Sig Dispense Refill  . albuterol (PROVENTIL HFA;VENTOLIN HFA) 108 (90 Base) MCG/ACT inhaler Inhale 2 puffs into the lungs 2 (two) times daily. 1 Inhaler 3  . AMBULATORY NON FORMULARY MEDICATION Take 2 tablets by mouth at bedtime. Serotrex    . amLODipine (NORVASC) 5 MG tablet TAKE 1 TABLET BY MOUTH EVERY DAY 15 tablet 0  . amphetamine-dextroamphetamine (ADDERALL XR) 20 MG 24 hr capsule Take 1 capsule (20 mg total) by mouth 2 (two) times daily. 60 capsule 0  . amphetamine-dextroamphetamine (ADDERALL XR) 20 MG 24 hr capsule Take 1 capsule (20 mg total) by mouth 2 (two) times daily. 60 capsule 0  . amphetamine-dextroamphetamine (ADDERALL XR) 20  MG 24 hr capsule Take 1 capsule (20 mg total) by mouth 2 (two) times daily. 60 capsule 0  . aspirin 81 MG tablet Take 81 mg by mouth at bedtime.     . Cholecalciferol (VITAMIN D) 2000 UNITS tablet Take 3,000 Units by mouth daily.     . clotrimazole-betamethasone (LOTRISONE) cream Apply 1 application topically 2 (two) times daily as needed (for psoriasis).    . Coenzyme Q10 (CO Q-10) 100 MG CAPS Take 100 mg by mouth daily.    . colestipol (COLESTID) 5 g packet Take 1 and a  half packets daily 180 packet 2  . Cyanocobalamin (VITAMIN B 12 PO) Take 5,000 mcg by mouth at bedtime.     Marland Kitchen DHEA 25 MG CAPS Take 25 mg by mouth daily.     Marland Kitchen escitalopram (LEXAPRO) 5 MG tablet TAKE 1.5 TABLETS (7.5 MG TOTAL) BY MOUTH DAILY. 135 tablet 1  . fexofenadine (ALLEGRA) 180 MG tablet Take 180 mg by mouth at bedtime.     . Glucosamine HCl (GLUCOSAMINE PO) Take 1,100 mg by mouth daily.    Marland Kitchen L-Lysine 1000 MG TABS Take 1,000 mg by mouth 2 (two) times daily.     . Melatonin 10 MG TABS Take 10 mg by mouth at bedtime.     . metoprolol succinate (TOPROL-XL) 50 MG 24 hr tablet Take 1 tablet (50 mg total) by mouth daily. Take with or immediately following a meal. 90 tablet 0  . Multiple Vitamin (MULTIVITAMIN) tablet Take 1 tablet by mouth daily.    . Omega-3 Fatty Acids (FISH OIL) 1000 MG CAPS Take 2,000 mg by mouth 2 (two) times daily.     Earney Navy Bicarbonate (ZEGERID) 20-1100 MG CAPS capsule Take 1 capsule by mouth 2 (two) times daily.     Marland Kitchen OVER THE COUNTER MEDICATION Take 100 mg by mouth daily. Pregnenolone supplement    . OVER THE COUNTER MEDICATION Take 200 mg by mouth daily. Sam-e l-methionine LBCore protocol George Hugh, [place]     . Psyllium (FIBER) 0.52 G CAPS Take 4.16 g by mouth at bedtime.     Marland Kitchen QUEtiapine (SEROQUEL) 25 MG tablet TAKE 3 TABLETS (75 MG TOTAL) BY MOUTH AT BEDTIME. 270 tablet 1  . Turmeric 500 MG CAPS Take 500 mg by mouth 2 (two) times daily.     . valACYclovir (VALTREX) 500 MG tablet Take 500 mg by mouth 2 (two) times daily.      No current facility-administered medications for this visit.     Medication Side Effects: None ? sweating  Allergies:  Allergies  Allergen Reactions  . Dilaudid [Hydromorphone Hcl] Other (See Comments)    Unknown  . Cymbalta [Duloxetine Hcl] Other (See Comments)    drowsiness  . Ibuprofen Hives  . Penicillins Other (See Comments)    Unknown Has patient had a PCN reaction causing immediate rash,  facial/tongue/throat swelling, SOB or lightheadedness with hypotension: Unknown Has patient had a PCN reaction causing severe rash involving mucus membranes or skin necrosis: Unknown Has patient had a PCN reaction that required hospitalization: Unknown Has patient had a PCN reaction occurring within the last 10 years: No If all of the above answers are "NO", then may proceed with Cephalosporin use.     Past Medical History:  Diagnosis Date  . Adenomatous colon polyp 2001  . Allergic rhinitis   . ALLERGIC RHINITIS 05/18/2007   Qualifier: Diagnosis of  By: Ronnald Ramp RN, CGRN, Sheri    . Allergy   .  Anxiety   . Arthritis    arthritis-hands  . Asthma   . ASTHMA 05/18/2007   Qualifier: Diagnosis of  By: Ronnald Ramp RN, CGRN, Sheri    . Bipolar 1 disorder ([place])   . CAD (coronary artery disease) 10/08/2014  . Cancer ([place])    hx skin cancer  . Chest tightness   . Chronic fatigue syndrome   . Chronic headaches    d/t old neck injury  . Complication of anesthesia    x1 episode "panic attack" awakening-none in recent years  . Coronary artery disease   . Depression   . DEPRESSION 05/18/2007   Qualifier: Diagnosis of  By: Ronnald Ramp RN, CGRN, Sheri    . Diverticulosis   . Dysrhythmia    was told benign by cardiologist  . GASTROESOPHAGEAL REFLUX DISEASE 05/18/2007   Qualifier: Diagnosis of  By: Ronnald Ramp RN, [place], [place]    . GERD (gastroesophageal reflux disease)   . HEADACHE, CHRONIC 05/18/2007   Qualifier: Diagnosis of  By: Ronnald Ramp RN, CGRN, Sheri    . Heart murmur    benign   . History of degenerative disc disease   . HOH (hard of hearing)    slight-bilateral  . Hypercholesterolemia   . Hyperlipidemia 05/18/2007   Qualifier: Diagnosis of  By: Ronnald Ramp RN, CGRN, Sheri    . IBS (irritable bowel syndrome)   . Internal and external hemorrhoids without complication   . Internal hemorrhoids with other complication [id number]  . Irritable bowel syndrome 05/18/2007   Qualifier: Diagnosis of  By: Ronnald Ramp RN, CGRN,  Sheri    . Lyme disease   . Personal history of adenomatous colonic polyps 05/18/2007   2001, 2004, 2006, 2009 (last with one small adenoma) Max 4 polyps in past with largest 7 mm (both in 2004)  09/11/2013 7 small polyps adenomas  02/2017 5 adeoimas max 10 mm repeat colon 03/2020    . PONV (postoperative nausea and vomiting)   . Prostatitis   . PROSTATITIS, CHRONIC 05/18/2007   Qualifier: Diagnosis of  By: Ronnald Ramp RN, CGRN, Sheri    . Radiculopathy of cervical spine   . Sleep apnea    better s/p UPP-no problems now  . Stiff neck   . Syncope    last episode 1 yr ago-evaluated by cardiologist-negative findings-?related to low B/p (hydration tends to help).  . Testicular hypofunction   . Vitamin D deficiency     Family History  Problem Relation Age of Onset  . Breast cancer Mother 82  . Heart failure Father 82  . Heart disease Unknown        grandfather/uncle  . Alcoholism Unknown        greatgrandfather/uncle    . Colon polyps Unknown        uncle  . CAD Brother   . CAD Brother   . Colon cancer Neg Hx   . Esophageal cancer Neg Hx   . Rectal cancer Neg Hx   . Stomach cancer Neg Hx     Social History   Socioeconomic History  . Marital status: Married    Spouse name: Not on file  . Number of children: 2  . Years of education: Not on file  . Highest education level: Not on file  Occupational History  . Occupation: Designer, jewellery: [employer]  Social Needs  . Financial resource strain: Not on file  . Food insecurity    Worry: Not on file    Inability: Not on file  .  Transportation needs    Medical: Not on file    Non-medical: Not on file  Tobacco Use  . Smoking status: Former Smoker    Packs/day: 0.50    Years: 7.00    Pack years: 3.50    Types: Cigarettes    Quit date: 07/06/1980    Years since quitting: 38.4  . Smokeless tobacco: Never Used  Substance and Sexual Activity  . Alcohol use: No  . Drug use: No  . Sexual activity: Yes  Lifestyle  .  Physical activity    Days per week: Not on file    Minutes per session: Not on file  . Stress: Not on file  Relationships  . Social Herbalist on phone: Not on file    Gets together: Not on file    Attends religious service: Not on file    Active member of club or organization: Not on file    Attends meetings of clubs or organizations: Not on file    Relationship status: Not on file  . Intimate partner violence    Fear of current or ex partner: Not on file    Emotionally abused: Not on file    Physically abused: Not on file    Forced sexual activity: Not on file  Other Topics Concern  . Not on file  Social History Narrative  . Not on file    Past Medical History, Surgical history, Social history, and Family history were reviewed and updated as appropriate.   Jake dx bipolar on lithium with tremor.  Answered questions about this.  Please see review of systems for further details on the patient's review from today.   Objective:   Physical Exam:  BP 124/74   Pulse 67   Physical Exam Constitutional:      General: He is not in acute distress.    Appearance: He is well-developed.  Musculoskeletal:        General: No deformity.  Neurological:     Mental Status: He is alert and oriented to person, place, and time.     Motor: No tremor.     Coordination: Coordination normal.     Gait: Gait normal.  Psychiatric:        Attention and Perception: He is inattentive. He does not perceive auditory hallucinations.        Mood and Affect: Mood is anxious. Mood is not depressed. Affect is not labile, blunt, angry or inappropriate.        Speech: Speech normal.        Behavior: Behavior normal.        Thought Content: Thought content normal. Thought content does not include homicidal or suicidal ideation. Thought content does not include homicidal or suicidal plan.        Cognition and Memory: Cognition normal.        Judgment: Judgment normal.     Comments: Insight  intact. No auditory or visual hallucinations.  Somatic concerns and focus chronically.  Now on SOB.    Get's SOB walking from one building to the next.  Lab Review:     Component Value Date/Time   NA 140 09/12/2017 0556   NA 140 09/11/2017 1425   K 4.0 09/12/2017 0556   CL 105 09/12/2017 0556   CO2 29 09/12/2017 0556   GLUCOSE 103 (H) 09/12/2017 0556   BUN 17 09/12/2017 0556   BUN 16 09/11/2017 1425   CREATININE 1.24 09/12/2017 0556   CALCIUM 9.8 09/12/2017  0556   PROT 6.9 09/26/2014 1543   ALBUMIN 3.8 09/26/2014 1543   AST 26 09/26/2014 1543   ALT 28 09/26/2014 1543   ALKPHOS 36 (L) 09/26/2014 1543   BILITOT 0.6 09/26/2014 1543   GFRNONAA >60 09/12/2017 0556   GFRAA >60 09/12/2017 0556       Component Value Date/Time   WBC 8.7 09/12/2017 0556   RBC 4.62 09/12/2017 0556   HGB 14.5 09/12/2017 0556   HGB 15.1 09/11/2017 1425   HCT 43.7 09/12/2017 0556   HCT 43.6 09/11/2017 1425   PLT 209 09/12/2017 0556   PLT 223 09/11/2017 1425   MCV 94.6 09/12/2017 0556   MCV 94 09/11/2017 1425   MCH 31.4 09/12/2017 0556   MCHC 33.2 09/12/2017 0556   RDW 13.0 09/12/2017 0556   RDW 14.1 09/11/2017 1425   LYMPHSABS 2.1 09/26/2014 1543   MONOABS 0.6 09/26/2014 1543   EOSABS 0.5 09/26/2014 1543   BASOSABS 0.0 09/26/2014 1543    No results found for: POCLITH, LITHIUM   No results found for: PHENYTOIN, PHENOBARB, VALPROATE, CBMZ   .res Assessment: Plan:    Generalized anxiety disorder  Attention deficit hyperactivity disorder (ADHD), predominantly inattentive type  PTSD (post-traumatic stress disorder)  Insomnia due to mental condition   Greater than 50% of face to face time with patient was spent on counseling and coordination of care. We discussed the change and benefit from the increase in the Lexapro for the anxiety.  This is the maximum tolerated dosage at this point.  Suspect residual anxiety is still a portion of what he's dealing with, but he's failed multiple meds  for anxiety.  He's not sig depressed at this time.  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   Benefit from the Adderall. Satisfied.   He doesn't want changes in that.  Doubt he would get adequate duration with once daily Adderall XR.  Disc way to figure out if it contributes to sweating.  Sleep benefit from the Seroquel is worth it. Plenty of sleep  Disc concerns with son's recent mental health crisis.  This appt 30 mins  FU 4-6 mos  Lynder Parents, MD, DFAPA     Please see After Visit Summary for patient specific instructions.  No future appointments.  No orders of the defined types were placed in this encounter.     -------------------------------

## 2018-12-21 MED ORDER — AMLODIPINE BESYLATE 5 MG PO TABS: 5.0000 mg | ORAL_TABLET | Freq: Every day | ORAL | 0 refills | Status: DC

## 2018-12-21 NOTE — Telephone Encounter (Signed)
Patient called about amlodipine refill

## 2018-12-21 NOTE — Telephone Encounter (Signed)
Left message for patient to return call to office.  Will continue efforts.

## 2018-12-21 NOTE — Addendum Note (Signed)
Addended by: Stevan Born on: 12/21/2018 02:37 PM   Modules accepted: Orders

## 2018-12-21 NOTE — Telephone Encounter (Signed)
Rx for amlodipine sent to pharmacy and patient was scheduled for follow up appointment.

## 2018-12-27 NOTE — Progress Notes (Signed)
Office Visit    Patient Name: Aaron Stout Date of Encounter: 12/28/2018  Primary Care Provider:  Marton Redwood, MD Primary Cardiologist:  Shirlee More, MD Electrophysiologist:  None   Chief Complaint    Aaron Stout is a 63 y.o. male with a hx of nonobstructive CAD, asthma, exertional angina, RBBB, HTN, HLD presents today for annual follow up of cardiac conditions.   Past Medical History    Past Medical History:  Diagnosis Date   Adenomatous colon polyp 2001   Allergic rhinitis    ALLERGIC RHINITIS 05/18/2007   Qualifier: Diagnosis of  By: Ronnald Ramp RN, CGRN, Sheri     Allergy    Anxiety    Arthritis    arthritis-hands   Asthma    ASTHMA 05/18/2007   Qualifier: Diagnosis of  By: Ronnald Ramp RN, CGRN, Sheri     Bipolar 1 disorder ([place])    CAD (coronary artery disease) 10/08/2014   Cancer ([place])    hx skin cancer   Chest tightness    Chronic fatigue syndrome    Chronic headaches    d/t old neck injury   Complication of anesthesia    x1 episode "panic attack" awakening-none in recent years   Coronary artery disease    Depression    DEPRESSION 05/18/2007   Qualifier: Diagnosis of  By: Ronnald Ramp RN, CGRN, Sheri     Diverticulosis    Dysrhythmia    was told benign by cardiologist   GASTROESOPHAGEAL REFLUX DISEASE 05/18/2007   Qualifier: Diagnosis of  By: Ronnald Ramp RN, CGRN, Sheri     GERD (gastroesophageal reflux disease)    HEADACHE, CHRONIC 05/18/2007   Qualifier: Diagnosis of  By: Ronnald Ramp RN, CGRN, Sheri     Heart murmur    benign    History of degenerative disc disease    HOH (hard of hearing)    slight-bilateral   Hypercholesterolemia    Hyperlipidemia 05/18/2007   Qualifier: Diagnosis of  By: Ronnald Ramp RN, CGRN, Sheri     IBS (irritable bowel syndrome)    Internal and external hemorrhoids without complication    Internal hemorrhoids with other complication [id number]   Irritable bowel syndrome 05/18/2007   Qualifier: Diagnosis of  By: Ronnald Ramp RN,  CGRN, Sheri     Lyme disease    Personal history of adenomatous colonic polyps 05/18/2007   2001, 2004, 2006, 2009 (last with one small adenoma) Max 4 polyps in past with largest 7 mm (both in 2004)  09/11/2013 7 small polyps adenomas  02/2017 5 adeoimas max 10 mm repeat colon 03/2020     PONV (postoperative nausea and vomiting)    Prostatitis    PROSTATITIS, CHRONIC 05/18/2007   Qualifier: Diagnosis of  By: Ronnald Ramp RN, CGRN, Sheri     Radiculopathy of cervical spine    Sleep apnea    better s/p UPP-no problems now   Stiff neck    Syncope    last episode 1 yr ago-evaluated by cardiologist-negative findings-?related to low B/p (hydration tends to help).   Testicular hypofunction    Vitamin D deficiency    Past Surgical History:  Procedure Laterality Date   [place] CATHETERIZATION  05/2008   negative   CHOLECYSTECTOMY N/A 10/08/2014   Procedure: LAPAROSCOPIC CHOLECYSTECTOMY WITH INTRAOPERATIVE CHOLANGIOGRAM;  Surgeon: Erroll Luna, MD;  Location: [facility];  Service: General;  Laterality: N/A;   COLONOSCOPY     COLONOSCOPY W/ BIOPSIES  multiple   ESOPHAGOGASTRODUODENOSCOPY  multiple  GREEN LIGHT LASER TURP (TRANSURETHRAL RESECTION OF PROSTATE N/A 06/01/2012   Procedure: GREEN LIGHT LASER OF PROSTATE ;  Surgeon: Ailene Rud, MD;  Location: [facility];  Service: Urology;  Laterality: N/A;   HAND SURGERY  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   tendon transplantation(injury)   LEFT HEART CATH AND CORONARY ANGIOGRAPHY N/A 09/12/2017   Procedure: LEFT HEART CATH AND CORONARY ANGIOGRAPHY;  Surgeon: Leonie Man, MD;  Location: [facility];  Service: Cardiovascular;  Laterality: N/A;   NASAL POLYP EXCISION     POLYPECTOMY     TONSILLECTOMY     UPPER GASTROINTESTINAL ENDOSCOPY     UVULOPALATOPHARYNGOPLASTY  1990    Allergies  Allergies  Allergen Reactions   Duloxetine Hcl Other (See Comments)    drowsiness   Ibuprofen Hives   Penicillins Other (See  Comments)    Unknown Has patient had a PCN reaction causing immediate rash, facial/tongue/throat swelling, SOB or lightheadedness with hypotension: Unknown Has patient had a PCN reaction causing severe rash involving mucus membranes or skin necrosis: Unknown Has patient had a PCN reaction that required hospitalization: Unknown Has patient had a PCN reaction occurring within the last 10 years: No If all of the above answers are "NO", then may proceed with Cephalosporin use.  Childhood response    Dilaudid [Hydromorphone Hcl] Other (See Comments)    Unknown    History of Present Illness    Aaron Stout is a 63 y.o. male with a hx of nonobstructive CAD, exertional angina, RBBB, HTN, HLD, asthma last seen by Dr. Bettina Gavia 10/20/17. He has had a cardiac CTA suggestive of flow limiting stenosis with subsequent catheterization with nonobstructive CAD (prox to mid Cx lesion 40% stenosed).   He reports feeling well.  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  Does not check his blood pressure routinely at home but states it is always good when checked in physician offices.  He reports his dyspnea on exertion is stable.  His PCP has recommended him for PFT unfortunately this has been delayed due to COVID-19 he anticipates having it done when available.  He does utilize pursed lip breathing when he begins to get dyspneic on exertion.  Tells me a number of years ago he was diagnosed with hypersensitive airway and anticipates this is some of his dyspnea on exertion.   EKGs/Labs/Other Studies Reviewed:   The following studies were reviewed today: Cardiac cath 09/2017  The left ventricular systolic function is normal.  LV end diastolic pressure is normal.  The left ventricular ejection fraction is 55-65% by visual estimate.  Extensive external/extraluminal calcification noted but no significant stenoses noted in the  LAD or diagonal.  Prox Cx to Mid Cx lesion is 40% stenosed.   Angiographically no significant coronary artery disease with evidence of external calcification noted. Normal LVEF with normal LVEDP    EKG:  EKG is ordered today.  The ekg ordered today demonstrates sinus rhythm with incomplete RBBB no acute ST/T wave changes.  Recent Labs: No results found for requested labs within last 8760 hours.  Recent Lipid Panel    Component Value Date/Time   CHOL 237 (H) 07/18/2011 1506   TRIG 124.0 07/18/2011 1506   HDL 50.70 07/18/2011 1506   CHOLHDL 5 07/18/2011 1506   VLDL 24.8 07/18/2011 1506   LDLDIRECT 180.6 07/18/2011 1506    Home Medications   Current Meds  Medication Sig  albuterol (PROVENTIL HFA;VENTOLIN HFA) 108 (90 Base) MCG/ACT inhaler Inhale 2 puffs into the lungs 2 (two) times daily.   AMBULATORY NON FORMULARY MEDICATION Take 2 tablets by mouth at bedtime. Serotrex   amLODipine (NORVASC) 5 MG tablet Take 1 tablet (5 mg total) by mouth daily.   amphetamine-dextroamphetamine (ADDERALL XR) 20 MG 24 hr capsule Take 1 capsule (20 mg total) by mouth 2 (two) times daily.   aspirin 81 MG tablet Take 81 mg by mouth at bedtime.    azelastine (ASTELIN) 0.1 % nasal spray Place 1 spray into both nostrils as needed for rhinitis. Use in each nostril as directed   Cholecalciferol (VITAMIN D) 2000 UNITS tablet Take 3,000 Units by mouth daily.    clotrimazole-betamethasone (LOTRISONE) cream Apply 1 application topically 2 (two) times daily as needed (for psoriasis).   Coenzyme Q10 (CO Q-10) 100 MG CAPS Take 100 mg by mouth daily.   colestipol (COLESTID) 5 g packet Take 1 and a half packets daily   Cyanocobalamin (VITAMIN B 12 PO) Take 5,000 mcg by mouth at bedtime.    DHEA 25 MG CAPS Take 25 mg by mouth daily.    escitalopram (LEXAPRO) 5 MG tablet TAKE 1.5 TABLETS (7.5 MG TOTAL) BY MOUTH DAILY.   fexofenadine (ALLEGRA) 180 MG tablet Take 180 mg by mouth at bedtime.     fluticasone (FLONASE) 50 MCG/ACT nasal spray Place 1 spray into both nostrils as needed for allergies or rhinitis.   Glucosamine HCl (GLUCOSAMINE PO) Take 1,100 mg by mouth daily.   L-Lysine 1000 MG TABS Take 1,000 mg by mouth 2 (two) times daily.    Melatonin 10 MG TABS Take 10 mg by mouth at bedtime.    metoprolol succinate (TOPROL-XL) 50 MG 24 hr tablet Take 1 tablet (50 mg total) by mouth daily. Take with or immediately following a meal.   Multiple Vitamin (MULTIVITAMIN) tablet Take 1 tablet by mouth daily.   Omega-3 Fatty Acids (FISH OIL) 1000 MG CAPS Take 2,000 mg by mouth 2 (two) times daily.    Omeprazole-Sodium Bicarbonate (ZEGERID) 20-1100 MG CAPS capsule Take 1 capsule by mouth 2 (two) times daily.    OVER THE COUNTER MEDICATION Take 100 mg by mouth daily. Pregnenolone supplement   OVER THE COUNTER MEDICATION Take 200 mg by mouth daily. Sam-e l-methionine LBCore protocol George Hugh, [place]    Psyllium (FIBER) 0.52 G CAPS Take 4.16 g by mouth at bedtime.    QUEtiapine (SEROQUEL) 25 MG tablet TAKE 3 TABLETS (75 MG TOTAL) BY MOUTH AT BEDTIME.   Turmeric 500 MG CAPS Take 500 mg by mouth 2 (two) times daily.    valACYclovir (VALTREX) 500 MG tablet Take 500 mg by mouth 2 (two) times daily.    [DISCONTINUED] amLODipine (NORVASC) 5 MG tablet Take 1 tablet (5 mg total) by mouth daily.   [DISCONTINUED] metoprolol succinate (TOPROL-XL) 50 MG 24 hr tablet Take 1 tablet (50 mg total) by mouth daily. Take with or immediately following a meal.      Review of Systems    Review of Systems  Constitution: Negative for chills, fever and malaise/fatigue.  Cardiovascular: Positive for dyspnea on exertion (stable at baseline). Negative for chest pain, leg swelling, near-syncope, orthopnea, palpitations and syncope.  Respiratory: Negative for cough, shortness of breath and wheezing.   Gastrointestinal: Negative for nausea and vomiting.  Neurological: Negative for dizziness,  light-headedness and weakness.   All other systems reviewed and are otherwise negative except as noted above.  Physical Exam  VS:  BP 124/84 (BP Location: Right Arm, Patient Position: Sitting, Cuff Size: Large)    Pulse 66    Ht 6\' 2"  (1.88 m)    Wt 246 lb 1.9 oz (111.6 kg)    SpO2 98%    BMI 31.60 kg/m  , BMI Body mass index is 31.6 kg/m. GEN: Well nourished, well developed, in no acute distress. HEENT: normal. Neck: Supple, no JVD, carotid bruits, or masses. Cardiac: RRR, no murmurs, rubs, or gallops. No clubbing, cyanosis, edema.  Radials/DP/PT 2+ and equal bilaterally.  Respiratory:  Respirations regular and unlabored, clear to auscultation bilaterally. GI: Soft, nontender, nondistended, BS + x 4. MS: No deformity or atrophy. Skin: Warm and dry, no rash. Neuro:  Strength and sensation are intact. Psych: Normal affect.  Accessory Clinical Findings    ECG personally reviewed by me today -NSR with incomplete RBBB with no acute ST/T wave changes- no acute changes.  Assessment & Plan    1. Mild nonobstructive CAD -stable with no chest pain.  Continue guideline directed therapy aspirin, beta-blocker, statin.  Encouraged regular exercise exercise regimen.  Encourage low-sodium, heart healthy diet.  No indication for repeat ischemic evaluation at this time.  2. HLD, LDL goal <70 -continue to follow with PCP.  Would recommend LDL goal less than 70 in setting of mild nonobstructive CAD.  3. DOE- Stable at baseline.  Has been recommended for PFT by his PCP.  Diagnosed a number of years ago with "hypersensitive airway ".  Encouraged him to use his albuterol inhaler as he does not use on a regular basis.  4. RBBB - Known history. EKG today with incomplete RBBB. Education provided to patient regarding this diagnosis.   5. HTN -BP well controlled.  Continue present antihypertensive regimen.  Disposition: Follow up in 1 year(s) with Dr. Earney Navy, NP 12/28/2018, 3:06 PM

## 2018-12-28 ENCOUNTER — Other Ambulatory Visit: Payer: Self-pay

## 2018-12-28 ENCOUNTER — Ambulatory Visit (INDEPENDENT_AMBULATORY_CARE_PROVIDER_SITE_OTHER): Payer: No Typology Code available for payment source | Admitting: Family

## 2018-12-28 ENCOUNTER — Encounter: Payer: Self-pay | Admitting: Family

## 2018-12-28 VITALS — BP 124/84 | HR 66 | Ht 74.0 in | Wt 246.1 lb

## 2018-12-28 DIAGNOSIS — R0600 Dyspnea, unspecified: Secondary | ICD-10-CM | POA: Diagnosis not present

## 2018-12-28 DIAGNOSIS — I2510 Atherosclerotic heart disease of native coronary artery without angina pectoris: Secondary | ICD-10-CM

## 2018-12-28 DIAGNOSIS — I4510 Unspecified right bundle-branch block: Secondary | ICD-10-CM | POA: Diagnosis not present

## 2018-12-28 DIAGNOSIS — E782 Mixed hyperlipidemia: Secondary | ICD-10-CM

## 2018-12-28 DIAGNOSIS — R0609 Other forms of dyspnea: Secondary | ICD-10-CM

## 2018-12-28 DIAGNOSIS — I10 Essential (primary) hypertension: Secondary | ICD-10-CM

## 2018-12-28 MED ORDER — METOPROLOL SUCCINATE ER 50 MG PO TB24: 50.0000 mg | ORAL_TABLET | Freq: Every day | ORAL | 3 refills | Status: DC

## 2018-12-28 MED ORDER — AMLODIPINE BESYLATE 5 MG PO TABS: 5.0000 mg | ORAL_TABLET | Freq: Every day | ORAL | 3 refills | Status: DC

## 2018-12-28 NOTE — Patient Instructions (Signed)
Medication Instructions:  Your physician recommends that you continue on your current medications as directed. Please refer to the Current Medication list given to you today.  *If you need a refill on your cardiac medications before your next appointment, please call your pharmacy*  Lab Work: None Ordereed   Testing/Procedures: None Ordered  Follow-Up: At [facility], you and your health needs are our priority.  As part of our continuing mission to provide you with exceptional heart care, we have created designated Provider Care Teams.  These Care Teams include your primary Cardiologist (physician) and Advanced Practice Providers (APPs -  Physician Assistants and Nurse Practitioners) who all work together to provide you with the care you need, when you need it.  Your next appointment:   12 months  The format for your next appointment:   In Person  Provider:   You may see Dr. Bettina Gavia or the following Advanced Practice Provider on your designated Care Team:    Laurann Montana, FNP   Other Instructions  You have a known Right Bundle Branch Block.Marland KitchenMarland KitchenMarland KitchenReport new symptoms of syncope (fainting) or near syncope (feeling like you're going to faint).

## 2019-03-04 ENCOUNTER — Ambulatory Visit: Payer: Self-pay

## 2019-03-04 NOTE — Telephone Encounter (Addendum)
Incoming call from Patient with a complaint of toilet being filled with bright red color blood.  Patient states this firs time it happened.   Denies any Pain.  Would like to make appointment.  Patient request a return call to [phone].  Encouraged Patient to seek Urgent Care.  If occurs overnight.  Patient voiced understanding.   Placed call to Patient to assess where exactly Patient provider is located.  Also inquired how the status of Patient is.  Left voice message. For a return call to number provided.   Out going call to Patient to clarify PCP and location.  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  Related to Patient to call [phone] apologized  For in convience.  Patient stated that had not had any more bloody stools nor the commode filled with bloody water.Patient states that he will call that office.       Answer Assessment - Initial Assessment Questions 1. OBJECT: "What do you think it is?"          2. SIZE: "How large is it?"      *No Answer* 3. PAIN: "Are you having any pain?" If so, ask: "How bad is it?"  (Scale 1-10; or mild, moderate, severe)     *No Answer* 4. ONSET: "How long do you think the foreign body has been there?"      *No Answer* 5. MECHANISM: "Tell me how it happened."     *No Answer* 6. BLEEDING: "Has there been any rectal bleeding?"     *No Answer* 7. PREGNANCY: "Is there any chance you are pregnant?" "When was your last menstrual period?"     *No Answer*  Answer Assessment - Initial Assessment Questions 1. SYMPTOM:  "What's the main symptom you're concerned about?" (e.g., pain, itching, swelling, rash)  bright red blood in toilet stool 2. ONSET: "When did the yesterday  start?"     Last night 3. RECTAL PAIN: "Do you have any pain around your rectum?" "How bad is the pain?"  (Scale 1-10; or mild, moderate, severe)  - MILD (1-3): doesn't interfere with normal activities   - MODERATE (4-7): interferes with normal activities or awakens from  sleep, limping   - SEVERE (8-10): excruciating pain, unable to have a bowel movement     denies 4. RECTAL ITCHING: "Do you have any itching in this area?" "How bad is the itching?"  (Scale 1-10; or mild, moderate, severe)  - MILD - doesn't interfere with normal activities   - MODERATE-SEVERE: interferes with normal activities or awakens from sleep      5. CONSTIPATION: "Do you have constipation?" If so, "How bad is it?"     no 6. CAUSE: "What do you think is causing the anus symptoms?"     unknown 7. OTHER SYMPTOMS: "Do you have any other symptoms?"  (e.g., rectal bleeding, abdominal pain, vomiting, fever) rectal bleeding. 8. PREGNANCY: "Is there any chance you are pregnant?" "When was your last menstrual period?"  Protocols used: RECTUM - FOREIGN BODY-A-AH, RECTAL SYMPTOMS-A-AH

## 2019-03-05 NOTE — Telephone Encounter (Signed)
This is not a [facility] provider. Will send note to Dr Brigitte Pulse and Roselie Awkward RN; not sure what office Dr Brigitte Pulse is in. Also calling [phone] and spoke with Evelena Peat with [facility] and Mateo Flow is not in office yet but Evelena Peat will let Mateo Flow know to contact DrShaw's office or the pt.

## 2019-04-03 ENCOUNTER — Other Ambulatory Visit: Payer: Self-pay

## 2019-04-03 ENCOUNTER — Telehealth: Payer: Self-pay | Admitting: Psychiatry

## 2019-04-03 DIAGNOSIS — F900 Attention-deficit hyperactivity disorder, predominantly inattentive type: Secondary | ICD-10-CM

## 2019-04-03 MED ORDER — AMPHETAMINE-DEXTROAMPHET ER 20 MG PO CP24: 20.0000 mg | ORAL_CAPSULE | Freq: Two times a day (BID) | ORAL | 0 refills | Status: DC

## 2019-04-03 NOTE — Telephone Encounter (Signed)
Last refill 03/05/2019 Next apt in April Pended 3 Rx's for Dr. Clovis Pu

## 2019-04-03 NOTE — Telephone Encounter (Signed)
Patient called and said that he needs a refill on his adderall xr 20 mg to be sent to [facility] on [address]

## 2019-04-16 ENCOUNTER — Other Ambulatory Visit: Payer: Self-pay | Admitting: Psychiatry

## 2019-04-16 ENCOUNTER — Other Ambulatory Visit: Payer: Self-pay

## 2019-04-16 MED ORDER — COLESTIPOL HCL 5 G PO PACK: PACK | ORAL | 0 refills | Status: DC

## 2019-05-24 ENCOUNTER — Other Ambulatory Visit: Payer: Self-pay | Admitting: Psychiatry

## 2019-06-19 ENCOUNTER — Other Ambulatory Visit: Payer: Self-pay

## 2019-06-19 ENCOUNTER — Encounter: Payer: Self-pay | Admitting: Psychiatry

## 2019-06-19 ENCOUNTER — Ambulatory Visit (INDEPENDENT_AMBULATORY_CARE_PROVIDER_SITE_OTHER): Payer: PRIVATE HEALTH INSURANCE | Admitting: Psychiatry

## 2019-06-19 DIAGNOSIS — F4310 Post-traumatic stress disorder, unspecified: Secondary | ICD-10-CM

## 2019-06-19 DIAGNOSIS — F411 Generalized anxiety disorder: Secondary | ICD-10-CM

## 2019-06-19 DIAGNOSIS — F900 Attention-deficit hyperactivity disorder, predominantly inattentive type: Secondary | ICD-10-CM

## 2019-06-19 DIAGNOSIS — F5105 Insomnia due to other mental disorder: Secondary | ICD-10-CM | POA: Diagnosis not present

## 2019-06-19 MED ORDER — AMPHETAMINE-DEXTROAMPHET ER 20 MG PO CP24: 20.0000 mg | ORAL_CAPSULE | Freq: Two times a day (BID) | ORAL | 0 refills | Status: DC

## 2019-06-19 MED ORDER — QUETIAPINE FUMARATE 25 MG PO TABS: 75.0000 mg | ORAL_TABLET | Freq: Every day | ORAL | 1 refills | Status: DC

## 2019-06-19 MED ORDER — ESCITALOPRAM OXALATE 5 MG PO TABS: 7.5000 mg | ORAL_TABLET | Freq: Every day | ORAL | 1 refills | Status: DC

## 2019-06-19 NOTE — Progress Notes (Signed)
JENESIS HOT [id number] October 05, 1955 64 y.o.  Subjective:   Patient ID:  Aaron Stout is a 65 y.o. (DOB 07-08-1955) male.  Chief Complaint:  Chief Complaint  Patient presents with  . Follow-up    mood and meds  . Anxiety  . ADHD    Depression        Associated symptoms include fatigue.  Associated symptoms include no decreased concentration and no suicidal ideas.  Aaron Stout presents to the office today for follow-up of mood and anxiety and sleep.  At visit in 2020had residual anxiety and it was suggested he disc with cardiologist the use of a beta blocker metoprolol and it helped BP and  anxiety DT multiple failed anxiety meds.  Has been generally good.    At  visit 10/27/19  Lexapro was increased from 5 to 7.5mg  dailty for anxiety which is chronic. Thinks the anxiety is a helpful.  I  Chronic anxiety and stress at work.  Evaluations pending at work.  Less obsessing about that.  Has tried 10mg  Lexapro and he feels it was too much.  Last visit October 2020 without med changes.    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   Still CO sweating easily. Pretty good overall.  No major changes since here.  Same old chronic tiredness and easily OOB.  Still a little anxiety hangs on.  Not horrible and able to function and keep on going.    Pt reports that mood is occ irritable and Anxious and describes anxiety as better mild. Anxiety symptoms include: Excessive Worry,. Pt reports no sleep issues. Pt reports that appetite is good. Pt reports that energy is good and good. Concentration is good. Suicidal thoughts:  denied by patient  Mild hangover with Seroquel which helps the sleep. Makes him sleepy in 60-90 mins.  Chronic sweating and SOB.  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  He's aware and tries to catch it.    Past Psychiatric Medication Trials: Lexapro 10 SE, Zoloft,  Emsam,  Welllbutrin Adderall, Ritalin ,  Concerta, Vyvanse, seroquel low for sleep, perphenazine, Depakote,  Vraylar 1.5, lithium 600, Trileptal, Buspar dizzy, lamotrigine,  Belsomra,   Review of Systems:  Review of Systems  Constitutional: Positive for fatigue.       Long history of sweating  Respiratory: Positive for chest tightness and shortness of breath.   Neurological: Negative for tremors and weakness.  Psychiatric/Behavioral: Positive for depression. Negative for agitation, behavioral problems, confusion, decreased concentration, dysphoric mood, hallucinations, self-injury, sleep disturbance and suicidal ideas. The patient is nervous/anxious. The patient is not hyperactive.     Medications: I have reviewed the patient's current medications.  Current Outpatient Medications  Medication Sig Dispense Refill  . albuterol (PROVENTIL HFA;VENTOLIN HFA) 108 (90 Base) MCG/ACT inhaler Inhale 2 puffs into the lungs 2 (two) times daily. 1 Inhaler 3  . AMBULATORY NON FORMULARY MEDICATION Take 2 tablets by mouth at bedtime. Serotrex    . amLODipine (NORVASC) 5 MG tablet Take 1 tablet (5 mg total) by mouth daily. 90 tablet 3  . amphetamine-dextroamphetamine (ADDERALL XR) 20 MG 24 hr capsule Take 1 capsule (20 mg total) by mouth 2 (two) times daily. 60 capsule 0  . [START ON 07/17/2019] amphetamine-dextroamphetamine (ADDERALL XR) 20 MG 24 hr capsule Take 1 capsule (20 mg total) by mouth 2 (two) times daily. 60 capsule 0  . [START ON 08/14/2019] amphetamine-dextroamphetamine (ADDERALL XR) 20  MG 24 hr capsule Take 1 capsule (20 mg total) by mouth 2 (two) times daily. 60 capsule 0  . aspirin 81 MG tablet Take 81 mg by mouth at bedtime.     Marland Kitchen azelastine (ASTELIN) 0.1 % nasal spray Place 1 spray into both nostrils as needed for rhinitis. Use in each nostril as directed    . Cholecalciferol (VITAMIN D) 2000 UNITS tablet Take 3,000 Units by mouth daily.     . clotrimazole-betamethasone (LOTRISONE) cream Apply 1 application topically 2 (two) times  daily as needed (for psoriasis).    . Coenzyme Q10 (CO Q-10) 100 MG CAPS Take 100 mg by mouth daily.    . colestipol (COLESTID) 5 g packet Take 1 and a half packets daily 180 packet 0  . Cyanocobalamin (VITAMIN B 12 PO) Take 5,000 mcg by mouth at bedtime.     Marland Kitchen DHEA 25 MG CAPS Take 25 mg by mouth daily.     Marland Kitchen escitalopram (LEXAPRO) 5 MG tablet Take 1.5 tablets (7.5 mg total) by mouth daily. 135 tablet 1  . fexofenadine (ALLEGRA) 180 MG tablet Take 180 mg by mouth at bedtime.     . fluticasone (FLONASE) 50 MCG/ACT nasal spray Place 1 spray into both nostrils as needed for allergies or rhinitis.    . Glucosamine HCl (GLUCOSAMINE PO) Take 1,100 mg by mouth daily.    Marland Kitchen L-Lysine 1000 MG TABS Take 1,000 mg by mouth 2 (two) times daily.     . Melatonin 10 MG TABS Take 10 mg by mouth at bedtime.     . metoprolol succinate (TOPROL-XL) 50 MG 24 hr tablet Take 1 tablet (50 mg total) by mouth daily. Take with or immediately following a meal. 90 tablet 3  . Multiple Vitamin (MULTIVITAMIN) tablet Take 1 tablet by mouth daily.    . Omega-3 Fatty Acids (FISH OIL) 1000 MG CAPS Take 2,000 mg by mouth 2 (two) times daily.     Earney Navy Bicarbonate (ZEGERID) 20-1100 MG CAPS capsule Take 1 capsule by mouth 2 (two) times daily.     Marland Kitchen OVER THE COUNTER MEDICATION Take 100 mg by mouth daily. Pregnenolone supplement    . OVER THE COUNTER MEDICATION Take 200 mg by mouth daily. Sam-e l-methionine LBCore protocol George Hugh, [place]     . Psyllium (FIBER) 0.52 G CAPS Take 4.16 g by mouth at bedtime.     Marland Kitchen QUEtiapine (SEROQUEL) 25 MG tablet Take 3 tablets (75 mg total) by mouth at bedtime. 270 tablet 1  . Turmeric 500 MG CAPS Take 500 mg by mouth 2 (two) times daily.     . valACYclovir (VALTREX) 500 MG tablet Take 500 mg by mouth 2 (two) times daily.      No current facility-administered medications for this visit.    Medication Side Effects: None ? sweating  Allergies:  Allergies  Allergen  Reactions  . Duloxetine Hcl Other (See Comments)    drowsiness  . Ibuprofen Hives  . Penicillins Other (See Comments)    Unknown Has patient had a PCN reaction causing immediate rash, facial/tongue/throat swelling, SOB or lightheadedness with hypotension: Unknown Has patient had a PCN reaction causing severe rash involving mucus membranes or skin necrosis: Unknown Has patient had a PCN reaction that required hospitalization: Unknown Has patient had a PCN reaction occurring within the last 10 years: No If all of the above answers are "NO", then may proceed with Cephalosporin use.  Childhood response   . Dilaudid [Hydromorphone Hcl] Other (  See Comments)    Unknown    Past Medical History:  Diagnosis Date  . Adenomatous colon polyp 2001  . Allergic rhinitis   . ALLERGIC RHINITIS 05/18/2007   Qualifier: Diagnosis of  By: Ronnald Ramp RN, CGRN, Sheri    . Allergy   . Anxiety   . Arthritis    arthritis-hands  . Asthma   . ASTHMA 05/18/2007   Qualifier: Diagnosis of  By: Ronnald Ramp RN, CGRN, Sheri    . Bipolar 1 disorder ([place])   . CAD (coronary artery disease) 10/08/2014  . Cancer ([place])    hx skin cancer  . Chest tightness   . Chronic fatigue syndrome   . Chronic headaches    d/t old neck injury  . Complication of anesthesia    x1 episode "panic attack" awakening-none in recent years  . Coronary artery disease   . Depression   . DEPRESSION 05/18/2007   Qualifier: Diagnosis of  By: Ronnald Ramp RN, CGRN, Sheri    . Diverticulosis   . Dysrhythmia    was told benign by cardiologist  . GASTROESOPHAGEAL REFLUX DISEASE 05/18/2007   Qualifier: Diagnosis of  By: Ronnald Ramp RN, [place], [place]    . GERD (gastroesophageal reflux disease)   . HEADACHE, CHRONIC 05/18/2007   Qualifier: Diagnosis of  By: Ronnald Ramp RN, CGRN, Sheri    . Heart murmur    benign   . History of degenerative disc disease   . HOH (hard of hearing)    slight-bilateral  . Hypercholesterolemia   . Hyperlipidemia 05/18/2007   Qualifier:  Diagnosis of  By: Ronnald Ramp RN, CGRN, Sheri    . IBS (irritable bowel syndrome)   . Internal and external hemorrhoids without complication   . Internal hemorrhoids with other complication [id number]  . Irritable bowel syndrome 05/18/2007   Qualifier: Diagnosis of  By: Ronnald Ramp RN, CGRN, Sheri    . Lyme disease   . Personal history of adenomatous colonic polyps 05/18/2007   2001, 2004, 2006, 2009 (last with one small adenoma) Max 4 polyps in past with largest 7 mm (both in 2004)  09/11/2013 7 small polyps adenomas  02/2017 5 adeoimas max 10 mm repeat colon 03/2020    . PONV (postoperative nausea and vomiting)   . Prostatitis   . PROSTATITIS, CHRONIC 05/18/2007   Qualifier: Diagnosis of  By: Ronnald Ramp RN, CGRN, Sheri    . Radiculopathy of cervical spine   . Sleep apnea    better s/p UPP-no problems now  . Stiff neck   . Syncope    last episode 1 yr ago-evaluated by cardiologist-negative findings-?related to low B/p (hydration tends to help).  . Testicular hypofunction   . Vitamin D deficiency     Family History  Problem Relation Age of Onset  . Breast cancer Mother 18  . Heart failure Father 38  . Heart disease Other        grandfather/uncle  . Alcoholism Other        greatgrandfather/uncle    . Colon polyps Other        uncle  . CAD Brother   . CAD Brother   . Colon cancer Neg Hx   . Esophageal cancer Neg Hx   . Rectal cancer Neg Hx   . Stomach cancer Neg Hx     Social History   Socioeconomic History  . Marital status: Married    Spouse name: Not on file  . Number of children: 2  . Years of education: Not on file  .  Highest education level: Not on file  Occupational History  . Occupation: Designer, jewellery: [employer]  Tobacco Use  . Smoking status: Former Smoker    Packs/day: 0.50    Years: 7.00    Pack years: 3.50    Types: Cigarettes    Quit date: 07/06/1980    Years since quitting: 38.9  . Smokeless tobacco: Never Used  Substance and Sexual Activity  .  Alcohol use: No  . Drug use: No  . Sexual activity: Yes  Other Topics Concern  . Not on file  Social History Narrative  . Not on file   Social Determinants of Health   Financial Resource Strain:   . Difficulty of Paying Living Expenses:   Food Insecurity:   . Worried About Charity fundraiser in the Last Year:   . Arboriculturist in the Last Year:   Transportation Needs:   . Film/video editor (Medical):   Marland Kitchen Lack of Transportation (Non-Medical):   Physical Activity:   . Days of Exercise per Week:   . Minutes of Exercise per Session:   Stress:   . Feeling of Stress :   Social Connections:   . Frequency of Communication with Friends and Family:   . Frequency of Social Gatherings with Friends and Family:   . Attends Religious Services:   . Active Member of Clubs or Organizations:   . Attends Archivist Meetings:   Marland Kitchen Marital Status:   Intimate Partner Violence:   . Fear of Current or Ex-Partner:   . Emotionally Abused:   Marland Kitchen Physically Abused:   . Sexually Abused:     Past Medical History, Surgical history, Social history, and Family history were reviewed and updated as appropriate.   Jake dx bipolar on lithium with tremor.  Answered questions about this.  Please see review of systems for further details on the patient's review from today.   Objective:   Physical Exam:  There were no vitals taken for this visit.  Physical Exam Constitutional:      General: He is not in acute distress.    Appearance: He is well-developed.  Musculoskeletal:        General: No deformity.  Neurological:     Mental Status: He is alert and oriented to person, place, and time.     Motor: No tremor.     Coordination: Coordination normal.     Gait: Gait normal.  Psychiatric:        Attention and Perception: He is inattentive. He does not perceive auditory hallucinations.        Mood and Affect: Mood is anxious. Mood is not depressed. Affect is not labile, blunt, angry or  inappropriate.        Speech: Speech normal.        Behavior: Behavior normal.        Thought Content: Thought content normal. Thought content does not include homicidal or suicidal ideation. Thought content does not include homicidal or suicidal plan.        Cognition and Memory: Cognition normal.        Judgment: Judgment normal.     Comments: Insight intact. No auditory or visual hallucinations.     Get's SOB walking from one building to the next.  Lab Review:     Component Value Date/Time   NA 140 09/12/2017 0556   NA 140 09/11/2017 1425   K 4.0 09/12/2017 0556   CL 105 09/12/2017  0556   CO2 29 09/12/2017 0556   GLUCOSE 103 (H) 09/12/2017 0556   BUN 17 09/12/2017 0556   BUN 16 09/11/2017 1425   CREATININE 1.24 09/12/2017 0556   CALCIUM 9.8 09/12/2017 0556   PROT 6.9 09/26/2014 1543   ALBUMIN 3.8 09/26/2014 1543   AST 26 09/26/2014 1543   ALT 28 09/26/2014 1543   ALKPHOS 36 (L) 09/26/2014 1543   BILITOT 0.6 09/26/2014 1543   GFRNONAA >60 09/12/2017 0556   GFRAA >60 09/12/2017 0556       Component Value Date/Time   WBC 8.7 09/12/2017 0556   RBC 4.62 09/12/2017 0556   HGB 14.5 09/12/2017 0556   HGB 15.1 09/11/2017 1425   HCT 43.7 09/12/2017 0556   HCT 43.6 09/11/2017 1425   PLT 209 09/12/2017 0556   PLT 223 09/11/2017 1425   MCV 94.6 09/12/2017 0556   MCV 94 09/11/2017 1425   MCH 31.4 09/12/2017 0556   MCHC 33.2 09/12/2017 0556   RDW 13.0 09/12/2017 0556   RDW 14.1 09/11/2017 1425   LYMPHSABS 2.1 09/26/2014 1543   MONOABS 0.6 09/26/2014 1543   EOSABS 0.5 09/26/2014 1543   BASOSABS 0.0 09/26/2014 1543    No results found for: POCLITH, LITHIUM   No results found for: PHENYTOIN, PHENOBARB, VALPROATE, CBMZ   .res Assessment: Plan:    Generalized anxiety disorder - Plan: escitalopram (LEXAPRO) 5 MG tablet  Attention deficit hyperactivity disorder (ADHD), predominantly inattentive type - Plan: amphetamine-dextroamphetamine (ADDERALL XR) 20 MG 24 hr capsule,  amphetamine-dextroamphetamine (ADDERALL XR) 20 MG 24 hr capsule, amphetamine-dextroamphetamine (ADDERALL XR) 20 MG 24 hr capsule  PTSD (post-traumatic stress disorder) - Plan: escitalopram (LEXAPRO) 5 MG tablet, QUEtiapine (SEROQUEL) 25 MG tablet  Insomnia due to mental condition - Plan: QUEtiapine (SEROQUEL) 25 MG tablet   Greater than 50% of face to face time with patient was spent on counseling and coordination of care. We discussed the change and benefit from the increase in the Lexapro for the anxiety to 7.5 mg daily.  This is the maximum tolerated dosage at this point.  Suspect residual anxiety is still a portion of what he's dealing with, but he's failed multiple meds for anxiety.  He's not sig depressed at this time.  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   Benefit from the Adderall. Satisfied with current dose   He doesn't want changes in that.  Doubt he would get adequate duration with once daily Adderall XR.  Disc way to figure out if it contributes to sweating.  Sleep benefit from the Seroquel is worth it. Plenty of sleep  Disc concerns with son's recent mental health crisis.  This appt 30 mins  FU 6 mos  Lynder Parents, MD, DFAPA     Please see After Visit Summary for patient specific instructions.  No future appointments.  No orders of the defined types were placed in this encounter.     -------------------------------

## 2019-07-05 ENCOUNTER — Other Ambulatory Visit: Payer: Self-pay | Admitting: Internal Medicine

## 2019-07-15 ENCOUNTER — Encounter: Payer: Self-pay | Admitting: Cardiovascular Disease

## 2019-07-30 ENCOUNTER — Other Ambulatory Visit (HOSPITAL_COMMUNITY): Payer: Self-pay | Admitting: Respiratory Therapy

## 2019-07-30 DIAGNOSIS — R0600 Dyspnea, unspecified: Secondary | ICD-10-CM

## 2019-07-30 DIAGNOSIS — R0609 Other forms of dyspnea: Secondary | ICD-10-CM

## 2019-08-01 ENCOUNTER — Other Ambulatory Visit (HOSPITAL_COMMUNITY)
Admission: RE | Admit: 2019-08-01 | Discharge: 2019-08-01 | Disposition: A | Discharge status: Home / Self Care | Payer: No Typology Code available for payment source | Source: Ambulatory Visit | Attending: Internal Medicine | Admitting: Internal Medicine

## 2019-08-01 DIAGNOSIS — Z20822 Contact with and (suspected) exposure to covid-19: Secondary | ICD-10-CM | POA: Insufficient documentation

## 2019-08-01 DIAGNOSIS — Z01812 Encounter for preprocedural laboratory examination: Secondary | ICD-10-CM | POA: Diagnosis present

## 2019-08-01 LAB — SARS CORONAVIRUS 2 (TAT 6-24 HRS): SARS Coronavirus 2: NEGATIVE

## 2019-08-02 ENCOUNTER — Ambulatory Visit (HOSPITAL_COMMUNITY)
Admission: RE | Admit: 2019-08-02 | Discharge: 2019-08-02 | Disposition: A | Discharge status: Home / Self Care | Payer: PRIVATE HEALTH INSURANCE | Source: Ambulatory Visit | Attending: Internal Medicine | Admitting: Internal Medicine

## 2019-08-02 ENCOUNTER — Other Ambulatory Visit: Payer: Self-pay

## 2019-08-02 DIAGNOSIS — R0600 Dyspnea, unspecified: Secondary | ICD-10-CM | POA: Insufficient documentation

## 2019-08-02 LAB — PULMONARY FUNCTION TEST
DL/VA % pred: 113 %
DL/VA: 4.66 ml/min/mmHg/L
DLCO unc % pred: 106 %
DLCO unc: 31.4 ml/min/mmHg
FEF 25-75 Post: 5.48 L/sec
FEF 25-75 Pre: 3.76 L/sec
FEF2575-%Change-Post: 46 %
FEF2575-%Pred-Post: 179 %
FEF2575-%Pred-Pre: 122 %
FEV1-%Change-Post: 10 %
FEV1-%Pred-Post: 104 %
FEV1-%Pred-Pre: 94 %
FEV1-Post: 4.02 L
FEV1-Pre: 3.64 L
FEV1FVC-%Change-Post: 5 %
FEV1FVC-%Pred-Pre: 107 %
FEV6-%Change-Post: 5 %
FEV6-%Pred-Post: 96 %
FEV6-%Pred-Pre: 91 %
FEV6-Post: 4.76 L
FEV6-Pre: 4.52 L
FEV6FVC-%Change-Post: 0 %
FEV6FVC-%Pred-Post: 104 %
FEV6FVC-%Pred-Pre: 104 %
FVC-%Change-Post: 5 %
FVC-%Pred-Post: 92 %
FVC-%Pred-Pre: 88 %
FVC-Post: 4.77 L
FVC-Pre: 4.54 L
Post FEV1/FVC ratio: 84 %
Post FEV6/FVC ratio: 100 %
Pre FEV1/FVC ratio: 80 %
Pre FEV6/FVC Ratio: 99 %
RV % pred: 96 %
RV: 2.41 L
TLC % pred: 94 %
TLC: 7.18 L

## 2019-08-02 MED ORDER — ALBUTEROL SULFATE (2.5 MG/3ML) 0.083% IN NEBU
2.5000 mg | INHALATION_SOLUTION | Freq: Once | RESPIRATORY_TRACT | Status: AC
  Administered 2019-08-02: 2.5 mg via RESPIRATORY_TRACT

## 2019-08-06 ENCOUNTER — Telehealth: Payer: Self-pay | Admitting: Psychiatry

## 2019-08-06 NOTE — Telephone Encounter (Signed)
Aaron Stout is out of Adderall after today. He is stating that he needs a PA updated to fill - [place]. Not sure what to do in the meantime. Can he get an RX to pick up at [facility] in the meantime? Can call him at [phone]

## 2019-08-06 NOTE — Telephone Encounter (Signed)
Prior authorization approved for AMPHETAMINE-DEXTROAMPHETAMINE ER 20 MG #60 through [facility] effective for 12 months, 08/05/2020. PA# [id number] ID# [id number]

## 2019-08-06 NOTE — Telephone Encounter (Signed)
Patient's PA was submitted today, should receive response today

## 2019-10-07 ENCOUNTER — Other Ambulatory Visit: Payer: Self-pay

## 2019-10-07 ENCOUNTER — Telehealth: Payer: Self-pay | Admitting: Psychiatry

## 2019-10-07 DIAGNOSIS — F900 Attention-deficit hyperactivity disorder, predominantly inattentive type: Secondary | ICD-10-CM

## 2019-10-07 MED ORDER — AMPHETAMINE-DEXTROAMPHET ER 20 MG PO CP24: 20.0000 mg | ORAL_CAPSULE | Freq: Two times a day (BID) | ORAL | 0 refills | Status: DC

## 2019-10-07 NOTE — Telephone Encounter (Signed)
Pt called and said  that he needs a refill on his adderall xr 20 mg to be sent to [facility] on [address]. He woul;d like a 3 month supply

## 2019-10-07 NOTE — Telephone Encounter (Signed)
Last refill 09/04/2019, pended for Dr. Clovis Pu to review and send 3 separate Rx's as requested for Adderall XR 20 mg. Next apt is 12/2019

## 2019-10-15 ENCOUNTER — Other Ambulatory Visit: Payer: Self-pay | Admitting: Internal Medicine

## 2019-12-19 ENCOUNTER — Ambulatory Visit (INDEPENDENT_AMBULATORY_CARE_PROVIDER_SITE_OTHER): Payer: PRIVATE HEALTH INSURANCE | Admitting: Psychiatry

## 2019-12-19 ENCOUNTER — Other Ambulatory Visit: Payer: Self-pay

## 2019-12-19 ENCOUNTER — Encounter: Payer: Self-pay | Admitting: Psychiatry

## 2019-12-19 DIAGNOSIS — F900 Attention-deficit hyperactivity disorder, predominantly inattentive type: Secondary | ICD-10-CM

## 2019-12-19 DIAGNOSIS — F4310 Post-traumatic stress disorder, unspecified: Secondary | ICD-10-CM | POA: Diagnosis not present

## 2019-12-19 DIAGNOSIS — F411 Generalized anxiety disorder: Secondary | ICD-10-CM

## 2019-12-19 DIAGNOSIS — F5105 Insomnia due to other mental disorder: Secondary | ICD-10-CM | POA: Diagnosis not present

## 2019-12-19 MED ORDER — QUETIAPINE FUMARATE 25 MG PO TABS: 75.0000 mg | ORAL_TABLET | Freq: Every day | ORAL | 1 refills | Status: DC

## 2019-12-19 MED ORDER — ESCITALOPRAM OXALATE 5 MG PO TABS: 7.5000 mg | ORAL_TABLET | Freq: Every day | ORAL | 1 refills | Status: DC

## 2019-12-19 MED ORDER — AMPHETAMINE-DEXTROAMPHET ER 20 MG PO CP24: 20.0000 mg | ORAL_CAPSULE | Freq: Two times a day (BID) | ORAL | 0 refills | Status: DC

## 2019-12-19 NOTE — Patient Instructions (Signed)
Increase Vitamin D to 5000 units daily

## 2019-12-19 NOTE — Progress Notes (Signed)
Aaron Stout [id number] [birth year] 64 y.o.  Subjective:   Patient ID:  Aaron Stout is a 64 y.o. (DOB 1955-12-30) male.  Chief Complaint:  Chief Complaint  Patient presents with  . Follow-up  . Anxiety  . ADHD    Depression        Associated symptoms include fatigue.  Associated symptoms include no decreased concentration and no suicidal ideas.  Aaron Stout presents to the office today for follow-up of mood and anxiety and sleep.  At visit in 2020had residual anxiety and it was suggested he disc with cardiologist the use of a beta blocker metoprolol and it helped BP and  anxiety DT multiple failed anxiety meds.  Has been generally good.    At  visit 10/27/19  Lexapro was increased from 5 to 7.5mg  dailty for anxiety which is chronic. Thinks the anxiety is a helpful.  I  Chronic anxiety and stress at work.  Evaluations pending at work.  Less obsessing about that.  Has tried 10mg  Lexapro and he feels it was too much.   visit October 2020 without med changes.    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   Still CO sweating easily. Pretty good overall.  No major changes since here.  Same old chronic tiredness and easily OOB.  Still a little anxiety hangs on.  Not horrible and able to function and keep on going.    12/19/2019 appointment with the following noted: Fine overall.  Generally steady with mood and other factors. Chronic tiredness without change.  Trying to manage stress as much as possible. Stress Hudson back in the house and some lability continues with anger outbursts.  He always finds someone at work he doesn't like and starts ruminating on it. SE mild hangover.  Pt reports that mood is occ irritable and Anxious and describes anxiety as mild-moderate like if too many things coming at him at once.   Anxiety symptoms include: Excessive Worry,. Pt reports no sleep issues. Pt reports that appetite is good.  Pt reports that energy is good and good. Concentration is good. Suicidal thoughts:  denied by patient  Mild hangover with Seroquel which helps the sleep. Makes him sleepy in 60-90 mins.  Chronic sweating and SOB.  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  He's aware and tries to catch it.    Past Psychiatric Medication Trials: Lexapro 10 SE, Zoloft,  Emsam,  Welllbutrin Adderall, Ritalin , Concerta, Vyvanse, seroquel 75 for sleep, perphenazine, Depakote,  Vraylar 1.5, lithium 600, Trileptal, Buspar dizzy, lamotrigine,  Belsomra,   Review of Systems:  Review of Systems  Constitutional: Positive for fatigue.       Long history of sweating  Respiratory: Positive for chest tightness and shortness of breath.   Neurological: Negative for dizziness, tremors and weakness.  Psychiatric/Behavioral: Positive for depression. Negative for agitation, behavioral problems, confusion, decreased concentration, dysphoric mood, hallucinations, self-injury, sleep disturbance and suicidal ideas. The patient is nervous/anxious. The patient is not hyperactive.     Medications: I have reviewed the patient's current medications.  Current Outpatient Medications  Medication Sig Dispense Refill  . albuterol (PROVENTIL HFA;VENTOLIN HFA) 108 (90 Base) MCG/ACT inhaler Inhale 2 puffs into the lungs 2 (two) times daily. 1 Inhaler 3  . AMBULATORY NON FORMULARY MEDICATION Take 2 tablets by mouth at bedtime. Serotrex    . amLODipine (NORVASC)  5 MG tablet Take 1 tablet (5 mg total) by mouth daily. 90 tablet 3  . [START ON 02/13/2020] amphetamine-dextroamphetamine (ADDERALL XR) 20 MG 24 hr capsule Take 1 capsule (20 mg total) by mouth 2 (two) times daily. 60 capsule 0  . [START ON 01/16/2020] amphetamine-dextroamphetamine (ADDERALL XR) 20 MG 24 hr capsule Take 1 capsule (20 mg total) by mouth 2 (two) times daily. 60 capsule 0  . amphetamine-dextroamphetamine  (ADDERALL XR) 20 MG 24 hr capsule Take 1 capsule (20 mg total) by mouth 2 (two) times daily. 60 capsule 0  . aspirin 81 MG tablet Take 81 mg by mouth at bedtime.     Marland Kitchen azelastine (ASTELIN) 0.1 % nasal spray Place 1 spray into both nostrils as needed for rhinitis. Use in each nostril as directed    . Cholecalciferol (VITAMIN D) 2000 UNITS tablet Take 3,000 Units by mouth daily.     . clotrimazole-betamethasone (LOTRISONE) cream Apply 1 application topically 2 (two) times daily as needed (for psoriasis).    . Coenzyme Q10 (CO Q-10) 100 MG CAPS Take 100 mg by mouth daily.    . colestipol (COLESTID) 5 g packet TAKE 1 AND A HALF PACKETS DAILY 135 packet 0  . Cyanocobalamin (VITAMIN B 12 PO) Take 5,000 mcg by mouth at bedtime.     Marland Kitchen DHEA 25 MG CAPS Take 25 mg by mouth daily.     Marland Kitchen escitalopram (LEXAPRO) 5 MG tablet Take 1.5 tablets (7.5 mg total) by mouth daily. 135 tablet 1  . fexofenadine (ALLEGRA) 180 MG tablet Take 180 mg by mouth at bedtime.     . fluticasone (FLONASE) 50 MCG/ACT nasal spray Place 1 spray into both nostrils as needed for allergies or rhinitis.    . Glucosamine HCl (GLUCOSAMINE PO) Take 1,100 mg by mouth daily.    Marland Kitchen L-Lysine 1000 MG TABS Take 1,000 mg by mouth 2 (two) times daily.     . Melatonin 10 MG TABS Take 10 mg by mouth at bedtime.     . metoprolol succinate (TOPROL-XL) 50 MG 24 hr tablet Take 1 tablet (50 mg total) by mouth daily. Take with or immediately following a meal. 90 tablet 3  . Multiple Vitamin (MULTIVITAMIN) tablet Take 1 tablet by mouth daily.    . Omega-3 Fatty Acids (FISH OIL) 1000 MG CAPS Take 2,000 mg by mouth 2 (two) times daily.     Earney Navy Bicarbonate (ZEGERID) 20-1100 MG CAPS capsule Take 1 capsule by mouth 2 (two) times daily.     Marland Kitchen OVER THE COUNTER MEDICATION Take 100 mg by mouth daily. Pregnenolone supplement    . OVER THE COUNTER MEDICATION Take 200 mg by mouth daily. Sam-e l-methionine LBCore protocol George Hugh, [place]     .  Psyllium (FIBER) 0.52 G CAPS Take 4.16 g by mouth at bedtime.     Marland Kitchen QUEtiapine (SEROQUEL) 25 MG tablet Take 3 tablets (75 mg total) by mouth at bedtime. 270 tablet 1  . Turmeric 500 MG CAPS Take 500 mg by mouth 2 (two) times daily.     . valACYclovir (VALTREX) 500 MG tablet Take 500 mg by mouth 2 (two) times daily.      No current facility-administered medications for this visit.    Medication Side Effects: None ? sweating  Allergies:  Allergies  Allergen Reactions  . Duloxetine Hcl Other (See Comments)    drowsiness  . Ibuprofen Hives  . Penicillins Other (See Comments)    Unknown Has patient had  a PCN reaction causing immediate rash, facial/tongue/throat swelling, SOB or lightheadedness with hypotension: Unknown Has patient had a PCN reaction causing severe rash involving mucus membranes or skin necrosis: Unknown Has patient had a PCN reaction that required hospitalization: Unknown Has patient had a PCN reaction occurring within the last 10 years: No If all of the above answers are "NO", then may proceed with Cephalosporin use.  Childhood response   . Dilaudid [Hydromorphone Hcl] Other (See Comments)    Unknown    Past Medical History:  Diagnosis Date  . Adenomatous colon polyp 2001  . Allergic rhinitis   . ALLERGIC RHINITIS 05/18/2007   Qualifier: Diagnosis of  By: Ronnald Ramp RN, CGRN, Sheri    . Allergy   . Anxiety   . Arthritis    arthritis-hands  . Asthma   . ASTHMA 05/18/2007   Qualifier: Diagnosis of  By: Ronnald Ramp RN, CGRN, Sheri    . Bipolar 1 disorder ([place])   . CAD (coronary artery disease) 10/08/2014  . Cancer ([place])    hx skin cancer  . Chest tightness   . Chronic fatigue syndrome   . Chronic headaches    d/t old neck injury  . Complication of anesthesia    x1 episode "panic attack" awakening-none in recent years  . Coronary artery disease   . Depression   . DEPRESSION 05/18/2007   Qualifier: Diagnosis of  By: Ronnald Ramp RN, CGRN, Sheri    . Diverticulosis   .  Dysrhythmia    was told benign by cardiologist  . GASTROESOPHAGEAL REFLUX DISEASE 05/18/2007   Qualifier: Diagnosis of  By: Ronnald Ramp RN, [place], [place]    . GERD (gastroesophageal reflux disease)   . HEADACHE, CHRONIC 05/18/2007   Qualifier: Diagnosis of  By: Ronnald Ramp RN, CGRN, Sheri    . Heart murmur    benign   . History of degenerative disc disease   . HOH (hard of hearing)    slight-bilateral  . Hypercholesterolemia   . Hyperlipidemia 05/18/2007   Qualifier: Diagnosis of  By: Ronnald Ramp RN, CGRN, Sheri    . IBS (irritable bowel syndrome)   . Internal and external hemorrhoids without complication   . Internal hemorrhoids with other complication 06/10/386  . Irritable bowel syndrome 05/18/2007   Qualifier: Diagnosis of  By: Ronnald Ramp RN, CGRN, Sheri    . Lyme disease   . Personal history of adenomatous colonic polyps 05/18/2007   2001, 2004, 2006, 2009 (last with one small adenoma) Max 4 polyps in past with largest 7 mm (both in 2004)  09/11/2013 7 small polyps adenomas  02/2017 5 adeoimas max 10 mm repeat colon 03/2020    . PONV (postoperative nausea and vomiting)   . Prostatitis   . PROSTATITIS, CHRONIC 05/18/2007   Qualifier: Diagnosis of  By: Ronnald Ramp RN, CGRN, Sheri    . Radiculopathy of cervical spine   . Sleep apnea    better s/p UPP-no problems now  . Stiff neck   . Syncope    last episode 1 yr ago-evaluated by cardiologist-negative findings-?related to low B/p (hydration tends to help).  . Testicular hypofunction   . Vitamin D deficiency     Family History  Problem Relation Age of Onset  . Breast cancer Mother 90  . Heart failure Father 68  . Heart disease Other        grandfather/uncle  . Alcoholism Other        greatgrandfather/uncle    . Colon polyps Other  uncle  . CAD Brother   . CAD Brother   . Colon cancer Neg Hx   . Esophageal cancer Neg Hx   . Rectal cancer Neg Hx   . Stomach cancer Neg Hx     Social History   Socioeconomic History  . Marital status: Married     Spouse name: Not on file  . Number of children: 2  . Years of education: Not on file  . Highest education level: Not on file  Occupational History  . Occupation: Designer, jewellery: [employer]  Tobacco Use  . Smoking status: Former Smoker    Packs/day: 0.50    Years: 7.00    Pack years: 3.50    Types: Cigarettes    Quit date: 07/06/1980    Years since quitting: 39.4  . Smokeless tobacco: Never Used  Vaping Use  . Vaping Use: Never used  Substance and Sexual Activity  . Alcohol use: No  . Drug use: No  . Sexual activity: Yes  Other Topics Concern  . Not on file  Social History Narrative  . Not on file   Social Determinants of Health   Financial Resource Strain:   . Difficulty of Paying Living Expenses: Not on file  Food Insecurity:   . Worried About Charity fundraiser in the Last Year: Not on file  . Ran Out of Food in the Last Year: Not on file  Transportation Needs:   . Lack of Transportation (Medical): Not on file  . Lack of Transportation (Non-Medical): Not on file  Physical Activity:   . Days of Exercise per Week: Not on file  . Minutes of Exercise per Session: Not on file  Stress:   . Feeling of Stress : Not on file  Social Connections:   . Frequency of Communication with Friends and Family: Not on file  . Frequency of Social Gatherings with Friends and Family: Not on file  . Attends Religious Services: Not on file  . Active Member of Clubs or Organizations: Not on file  . Attends Archivist Meetings: Not on file  . Marital Status: Not on file  Intimate Partner Violence:   . Fear of Current or Ex-Partner: Not on file  . Emotionally Abused: Not on file  . Physically Abused: Not on file  . Sexually Abused: Not on file    Past Medical History, Surgical history, Social history, and Family history were reviewed and updated as appropriate.   Aaron Stout dx bipolar on lithium with tremor.  Answered questions about this.  Please see review of  systems for further details on the patient's review from today.   Objective:   Physical Exam:  There were no vitals taken for this visit.  Physical Exam Constitutional:      General: He is not in acute distress.    Appearance: He is well-developed.  Musculoskeletal:        General: No deformity.  Neurological:     Mental Status: He is alert and oriented to person, place, and time.     Motor: No tremor.     Coordination: Coordination normal.     Gait: Gait normal.  Psychiatric:        Attention and Perception: He is inattentive. He does not perceive auditory hallucinations.        Mood and Affect: Mood is anxious. Mood is not depressed. Affect is not labile, blunt, angry, tearful or inappropriate.  Speech: Speech normal.        Behavior: Behavior normal.        Thought Content: Thought content normal. Thought content does not include homicidal or suicidal ideation. Thought content does not include homicidal or suicidal plan.        Cognition and Memory: Cognition normal.        Judgment: Judgment normal.     Comments: Insight intact. No auditory or visual hallucinations.     Get's SOB walking from one building to the next.  Lab Review:     Component Value Date/Time   NA 140 09/12/2017 0556   NA 140 09/11/2017 1425   K 4.0 09/12/2017 0556   CL 105 09/12/2017 0556   CO2 29 09/12/2017 0556   GLUCOSE 103 (H) 09/12/2017 0556   BUN 17 09/12/2017 0556   BUN 16 09/11/2017 1425   CREATININE 1.24 09/12/2017 0556   CALCIUM 9.8 09/12/2017 0556   PROT 6.9 09/26/2014 1543   ALBUMIN 3.8 09/26/2014 1543   AST 26 09/26/2014 1543   ALT 28 09/26/2014 1543   ALKPHOS 36 (L) 09/26/2014 1543   BILITOT 0.6 09/26/2014 1543   GFRNONAA >60 09/12/2017 0556   GFRAA >60 09/12/2017 0556       Component Value Date/Time   WBC 8.7 09/12/2017 0556   RBC 4.62 09/12/2017 0556   HGB 14.5 09/12/2017 0556   HGB 15.1 09/11/2017 1425   HCT 43.7 09/12/2017 0556   HCT 43.6 09/11/2017 1425    PLT 209 09/12/2017 0556   PLT 223 09/11/2017 1425   MCV 94.6 09/12/2017 0556   MCV 94 09/11/2017 1425   MCH 31.4 09/12/2017 0556   MCHC 33.2 09/12/2017 0556   RDW 13.0 09/12/2017 0556   RDW 14.1 09/11/2017 1425   LYMPHSABS 2.1 09/26/2014 1543   MONOABS 0.6 09/26/2014 1543   EOSABS 0.5 09/26/2014 1543   BASOSABS 0.0 09/26/2014 1543    No results found for: POCLITH, LITHIUM   No results found for: PHENYTOIN, PHENOBARB, VALPROATE, CBMZ   .res Assessment: Plan:    Generalized anxiety disorder - Plan: escitalopram (LEXAPRO) 5 MG tablet  PTSD (post-traumatic stress disorder) - Plan: escitalopram (LEXAPRO) 5 MG tablet, QUEtiapine (SEROQUEL) 25 MG tablet  Attention deficit hyperactivity disorder (ADHD), predominantly inattentive type - Plan: amphetamine-dextroamphetamine (ADDERALL XR) 20 MG 24 hr capsule, amphetamine-dextroamphetamine (ADDERALL XR) 20 MG 24 hr capsule, amphetamine-dextroamphetamine (ADDERALL XR) 20 MG 24 hr capsule  Insomnia due to mental condition - Plan: QUEtiapine (SEROQUEL) 25 MG tablet   Greater than 50% of face to face time with patient was spent on counseling and coordination of care. We discussed the change and benefit from the increase in the Lexapro for the anxiety to 7.5 mg daily.  This is the maximum tolerated dosage at this point.  Suspect residual anxiety is still a portion of what he's dealing with, but he's failed multiple meds for anxiety.  He's not sig depressed at this time.  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   Benefit from the Adderall. Satisfied with current dose   He doesn't want changes in that.  Doubt he would get adequate duration with once daily Adderall XR.  Disc way to figure out if it contributes to sweating.  Sleep benefit from the Seroquel is worth it. Plenty of sleep  Disc concerns with son's recent mental health crisis.  Disc value of vitamin D for  mental  health and suggest trial of  5000U daily for the winter to see energy is a little better.  This appt 30 mins  FU 6 mos  Lynder Parents, MD, DFAPA     Please see After Visit Summary for patient specific instructions.  No future appointments.  No orders of the defined types were placed in this encounter.     -------------------------------

## 2019-12-24 ENCOUNTER — Other Ambulatory Visit: Payer: Self-pay | Admitting: Internal Medicine

## 2020-02-01 ENCOUNTER — Other Ambulatory Visit: Payer: Self-pay | Admitting: Internal Medicine

## 2020-02-04 ENCOUNTER — Other Ambulatory Visit: Payer: Self-pay | Admitting: Internal Medicine

## 2020-02-05 ENCOUNTER — Telehealth: Payer: Self-pay | Admitting: Internal Medicine

## 2020-02-05 MED ORDER — COLESTIPOL HCL 5 G PO PACK: PACK | ORAL | 0 refills | Status: DC

## 2020-02-05 NOTE — Telephone Encounter (Signed)
Pt is requesting a refill on his COLESTID, pt has scheduled an appt for 02/25/20 but pt wants to know what he can do in the meantime.  [address]

## 2020-02-05 NOTE — Telephone Encounter (Signed)
Patient informed that I refilled his medicine and he said thank you.

## 2020-02-06 ENCOUNTER — Telehealth: Payer: Self-pay | Admitting: Psychiatry

## 2020-02-06 ENCOUNTER — Other Ambulatory Visit: Payer: Self-pay | Admitting: Psychiatry

## 2020-02-06 DIAGNOSIS — F900 Attention-deficit hyperactivity disorder, predominantly inattentive type: Secondary | ICD-10-CM

## 2020-02-06 MED ORDER — AMPHETAMINE-DEXTROAMPHET ER 20 MG PO CP24: 20.0000 mg | ORAL_CAPSULE | Freq: Two times a day (BID) | ORAL | 0 refills | Status: DC

## 2020-02-06 NOTE — Telephone Encounter (Signed)
Pt called and said that his [facility] is out of his adderall so he needs the November script to be cancelled there and sent to [facility] on [address] in [place]. Pt is completely out.

## 2020-02-25 ENCOUNTER — Ambulatory Visit (INDEPENDENT_AMBULATORY_CARE_PROVIDER_SITE_OTHER): Payer: PRIVATE HEALTH INSURANCE | Admitting: Physician Assistant

## 2020-02-25 ENCOUNTER — Telehealth: Payer: Self-pay

## 2020-02-25 ENCOUNTER — Encounter: Payer: Self-pay | Admitting: Physician Assistant

## 2020-02-25 VITALS — BP 130/80 | HR 80 | Ht 73.0 in | Wt 249.4 lb

## 2020-02-25 DIAGNOSIS — K529 Noninfective gastroenteritis and colitis, unspecified: Secondary | ICD-10-CM

## 2020-02-25 DIAGNOSIS — K648 Other hemorrhoids: Secondary | ICD-10-CM

## 2020-02-25 DIAGNOSIS — K219 Gastro-esophageal reflux disease without esophagitis: Secondary | ICD-10-CM

## 2020-02-25 DIAGNOSIS — Z86010 Personal history of colonic polyps: Secondary | ICD-10-CM | POA: Diagnosis not present

## 2020-02-25 DIAGNOSIS — K9089 Other intestinal malabsorption: Secondary | ICD-10-CM

## 2020-02-25 MED ORDER — COLESTIPOL HCL 5 G PO PACK: PACK | ORAL | 3 refills | Status: DC

## 2020-02-25 NOTE — Telephone Encounter (Signed)
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  Also once colonoscopy scheduled then get him an office appt for banding with Dr Carlean Purl later in March or April   thanks

## 2020-02-25 NOTE — Progress Notes (Signed)
Subjective:    Patient ID: Aaron Stout, male    DOB: 01-01-1956, 64 y.o.   MRN: [id number]  HPI Aaron Stout is a pleasant 64 year old white male, established with Dr. Carlean Purl who comes in today for med refills.  He was last seen in the office in 2017.  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 He generally takes 7.5 g daily with good control of symptoms. He has no current complaints of abdominal pain today, he occasionally will have some small-volume hematochezia secondary to hemorrhoids.  He is interested in pursuing hemorrhoidal banding. Patient also has history of chronic GERD and is currently on OTC Zegerid 20/1100 daily again with fairly good control of symptoms.  No dysphagia or odynophagia. Prior EGD in 2017 showed patchy gastritis and small sessile polyps. Last colonoscopy December 2018 was done for history of adenomatous polyps.  He had a 10 mm polyp in the descending colon and 3 sessile polyps in the left colon as well as 1 to millimeter polyp.  Also noted multiple diverticuli.  Path was consistent with 5 tubular adenomatous polyps and follow-up plan for 2022. Other medical issues include coronary artery disease, hypertension, sleep apnea, chronic prostatitis, bipolar disorder, PTSD and chronic anxiety. Patient relates that he just had a partial knee replacement on the right about 2 weeks ago and is doing well though still in pain and is undergoing physical therapy.  Review of Systems Pertinent positive and negative review of systems were noted in the above HPI section.  All other review of systems was otherwise negative.  Outpatient Encounter Medications as of 02/25/2020  Medication Sig  . albuterol (PROVENTIL HFA;VENTOLIN HFA) 108 (90 Base) MCG/ACT inhaler Inhale 2 puffs into the lungs 2 (two) times daily.  . AMBULATORY NON FORMULARY MEDICATION Take 2 tablets by mouth at  bedtime. Serotrex  . amLODipine (NORVASC) 5 MG tablet Take 1 tablet (5 mg total) by mouth daily.  Marland Kitchen amphetamine-dextroamphetamine (ADDERALL XR) 20 MG 24 hr capsule Take 1 capsule (20 mg total) by mouth 2 (two) times daily. (Patient taking differently: Take 40 mg by mouth daily.)  . Ascorbic Acid (VITAMIN C) 1000 MG tablet Take 1,000 mg by mouth 2 (two) times daily.  Marland Kitchen aspirin 81 MG tablet Take 81 mg by mouth at bedtime.   Marland Kitchen azelastine (ASTELIN) 0.1 % nasal spray Place 1 spray into both nostrils as needed for rhinitis. Use in each nostril as directed  . Cholecalciferol (VITAMIN D) 2000 UNITS tablet Take 3,000 Units by mouth daily.   . clotrimazole-betamethasone (LOTRISONE) cream Apply 1 application topically 2 (two) times daily as needed (for psoriasis).  . Coenzyme Q10 (CO Q-10) 100 MG CAPS Take 100 mg by mouth daily.  . Cyanocobalamin (VITAMIN B 12 PO) Take 5,000 mcg by mouth at bedtime.   Marland Kitchen DHEA 25 MG CAPS Take 25 mg by mouth daily.   Marland Kitchen escitalopram (LEXAPRO) 5 MG tablet Take 1.5 tablets (7.5 mg total) by mouth daily.  . fexofenadine (ALLEGRA) 180 MG tablet Take 180 mg by mouth at bedtime.  . fluticasone (FLONASE) 50 MCG/ACT nasal spray Place 1 spray into both nostrils as needed for allergies or rhinitis.  Marland Kitchen L-Lysine 1000 MG TABS Take 1,000 mg by mouth 2 (two) times daily.   . Melatonin 10 MG TABS Take 10 mg by mouth at bedtime.   . metoprolol succinate (TOPROL-XL) 50 MG 24 hr tablet Take 1  tablet (50 mg total) by mouth daily. Take with or immediately following a meal.  . Multiple Vitamin (MULTIVITAMIN) tablet Take 1 tablet by mouth daily.  . Omega-3 Fatty Acids (FISH OIL) 1000 MG CAPS Take 2,000 mg by mouth 2 (two) times daily.   Earney Navy Bicarbonate (ZEGERID) 20-1100 MG CAPS capsule Take 1 capsule by mouth 2 (two) times daily.   Marland Kitchen OVER THE COUNTER MEDICATION Take 100 mg by mouth daily. Pregnenolone supplement  . Psyllium (FIBER) 0.52 G CAPS Take 4.16 g by mouth at bedtime.   .  Quercetin 250 MG TABS Take 2 tablets by mouth 2 (two) times daily.  . QUEtiapine (SEROQUEL) 25 MG tablet Take 3 tablets (75 mg total) by mouth at bedtime.  . Turmeric 500 MG CAPS Take 500 mg by mouth 2 (two) times daily.   . valACYclovir (VALTREX) 500 MG tablet Take 500 mg by mouth 2 (two) times daily.   . [DISCONTINUED] colestipol (COLESTID) 5 g packet Take one and a half packets daily  . colestipol (COLESTID) 5 g packet Take one and a half packets daily  . [DISCONTINUED] amphetamine-dextroamphetamine (ADDERALL XR) 20 MG 24 hr capsule Take 1 capsule (20 mg total) by mouth 2 (two) times daily.  . [DISCONTINUED] amphetamine-dextroamphetamine (ADDERALL XR) 20 MG 24 hr capsule Take 1 capsule (20 mg total) by mouth 2 (two) times daily.  . [DISCONTINUED] Glucosamine HCl (GLUCOSAMINE PO) Take 1,100 mg by mouth daily.  . [DISCONTINUED] OVER THE COUNTER MEDICATION Take 200 mg by mouth daily. Sam-e l-methionine LBCore protocol George Hugh, [place]    No facility-administered encounter medications on file as of 02/25/2020.   Allergies  Allergen Reactions  . Duloxetine Hcl Other (See Comments)    drowsiness  . Ibuprofen Hives  . Penicillins Other (See Comments)    Unknown Has patient had a PCN reaction causing immediate rash, facial/tongue/throat swelling, SOB or lightheadedness with hypotension: Unknown Has patient had a PCN reaction causing severe rash involving mucus membranes or skin necrosis: Unknown Has patient had a PCN reaction that required hospitalization: Unknown Has patient had a PCN reaction occurring within the last 10 years: No If all of the above answers are "NO", then may proceed with Cephalosporin use.  Childhood response   . Dilaudid [Hydromorphone Hcl] Nausea And Vomiting   Patient Active Problem List   Diagnosis Date Noted  . Bile salt-induced diarrhea 02/25/2020  . PTSD (post-traumatic stress disorder) 01/03/2018  . Angina pectoris ([place]) 09/07/2017  . Abnormal  cardiac CT angiography 09/07/2017  . Essential hypertension 06/15/2017  . Palpitation 06/15/2017  . RBBB 06/14/2017  . Coronary artery disease, non-occlusive 10/08/2014  . Internal hemorrhoids with other complication 17/49/4496  . Syncope   . Chest tightness   . Bipolar 1 disorder ([place])   . Sleep apnea   . Hypercholesterolemia   . Personal history of adenomatous colonic polyps 05/18/2007  . Hyperlipidemia 05/18/2007  . DEPRESSION 05/18/2007  . ALLERGIC RHINITIS 05/18/2007  . ASTHMA 05/18/2007  . GASTROESOPHAGEAL REFLUX DISEASE 05/18/2007  . IRRITABLE BOWEL SYNDROME 05/18/2007  . PROSTATITIS, CHRONIC 05/18/2007  . HEADACHE, CHRONIC 05/18/2007   Social History   Socioeconomic History  . Marital status: Married    Spouse name: Not on file  . Number of children: 2  . Years of education: Not on file  . Highest education level: Not on file  Occupational History  . Occupation: Designer, jewellery: [employer]  Tobacco Use  . Smoking status: Former Smoker  Packs/day: 0.50    Years: 7.00    Pack years: 3.50    Types: Cigarettes    Quit date: 07/06/1980    Years since quitting: 39.6  . Smokeless tobacco: Never Used  Vaping Use  . Vaping Use: Never used  Substance and Sexual Activity  . Alcohol use: No  . Drug use: No  . Sexual activity: Yes  Other Topics Concern  . Not on file  Social History Narrative  . Not on file   Social Determinants of Health   Financial Resource Strain: Not on file  Food Insecurity: Not on file  Transportation Needs: Not on file  Physical Activity: Not on file  Stress: Not on file  Social Connections: Not on file  Intimate Partner Violence: Not on file    Aaron Stout's family history includes Alcoholism in an other family member; Breast cancer (age of onset: 41) in his mother; CAD in his brother and brother; Colon polyps in an other family member; Heart disease in an other family member; Heart failure (age of onset: 24) in his  father.      Objective:    Vitals:   02/25/20 1141  BP: 130/80  Pulse: 80    Physical Exam Well-developed well-nourished older WM /male in no acute distress.  Height, DHRCBU,384  BMI32  HEENT; nontraumatic normocephalic, EOMI, PE R LA, sclera anicteric. Oropharynx;not examined Neck; supple, no JVD Cardiovascular; regular rate and rhythm with S1-S2, no murmur rub or gallop Pulmonary; Clear bilaterally Abdomen; soft, nontender, nondistended, no palpable mass or hepatosplenomegaly, bowel sounds are active Rectal;not done Skin; benign exam, no jaundice rash or appreciable lesions Extremities; no clubbing cyanosis or edema skin warm and dry Neuro/Psych; alert and oriented x4, grossly nonfocal mood and affect appropriate       Assessment & Plan:   #89 64 year old white male with bile salt induced diarrhea postcholecystectomy here for med refill.  He is on Colestid 7.5 g daily with good control of symptoms #2 GERD chronic-stable on OTC Zegerid twice daily #3 history of adenomatous colon polyps-last colonoscopy 2018 with 5 tubular adenomas indicated for follow-up 2022  #4 diverticulosis  #5 CAD  #6 OSA   #7 anxiety/ PTSD/ Bipolar  #8 intermittent hemorrhoidal; sxs, low volume hematochezia, occas discomfort - previously  documented internal hemorrhoids though not commented on at last Colonoscopy - pt interested in hemorroidal banding  #9 recent partial Knee replacement Right   Plan; Refill Colestid 5gm pkt - take 1.5 packett daily -refill x one year  Continue OTC Zegerid BID  Pt would like to schedule Colonoscopy for early 2022 as has met deductibles - will plan for march 2022 while he is on Spring break.Will get scheduled for pre-visit when March schedule out  He also wants to  plan for hemorrhoidal banding - will plan to schedule after Colonoscopy   Cejay Cambre S Macdonald Rigor PA-C 02/25/2020   Cc: Marton Redwood, MD

## 2020-02-25 NOTE — Telephone Encounter (Signed)
Staff message reminder done for the Colonoscopy and Banding

## 2020-02-25 NOTE — Patient Instructions (Addendum)
If you are age 64 or older, your body mass index should be between 23-30. Your Body mass index is 32.9 kg/m. If this is out of the aforementioned range listed, please consider follow up with your Primary Care Provider.  If you are age 79 or younger, your body mass index should be between 19-25. Your Body mass index is 32.9 kg/m. If this is out of the aformentioned range listed, please consider follow up with your Primary Care Provider.   We have sent refills Colestipol to your pharmacy.  It has been recommended to you by your physician that you have a(n) Colonoscopy completed. Per your request, we did not schedule the procedure(s) today. Please contact our office at [phone] should you decide to have the procedure completed. You will be scheduled for a pre-visit and procedure at that time.  Thank you for entrusting me with your care and choosing [facility].  Dr Ardis Hughs

## 2020-03-25 NOTE — Telephone Encounter (Signed)
Message to the patient asking he call us soon for scheduling.

## 2020-04-30 ENCOUNTER — Other Ambulatory Visit: Payer: Self-pay

## 2020-04-30 ENCOUNTER — Encounter: Payer: Self-pay | Admitting: Internal Medicine

## 2020-04-30 ENCOUNTER — Ambulatory Visit (AMBULATORY_SURGERY_CENTER): Payer: Self-pay | Admitting: *Deleted

## 2020-04-30 VITALS — Ht 73.0 in | Wt 251.0 lb

## 2020-04-30 DIAGNOSIS — Z86010 Personal history of colonic polyps: Secondary | ICD-10-CM

## 2020-04-30 NOTE — Progress Notes (Signed)
No egg or soy allergy known to patient  issues with past sedation with any surgeries or procedures of PONv-  No intubation problems in the past  No FH of Malignant Hyperthermia No diet pills per patient No home 02 use per patient  No blood thinners per patient  Pt denies issues with constipation  No A fib or A flutter  EMMI video to pt or via [place] 19 guidelines implemented in PV today with Pt and RN  Pt is fully vaccinated  for Covid   Due to the COVID-19 pandemic we are asking patients to follow certain guidelines.  Pt aware of COVID protocols and [facility] guidelines

## 2020-05-06 ENCOUNTER — Other Ambulatory Visit: Payer: Self-pay | Admitting: Psychiatry

## 2020-05-06 DIAGNOSIS — F4310 Post-traumatic stress disorder, unspecified: Secondary | ICD-10-CM

## 2020-05-06 DIAGNOSIS — F411 Generalized anxiety disorder: Secondary | ICD-10-CM

## 2020-05-13 ENCOUNTER — Telehealth: Payer: Self-pay | Admitting: Psychiatry

## 2020-05-13 NOTE — Telephone Encounter (Signed)
Patient lm requesting a refill on his Adderall 20 mg. A follow up appt is scheduled for 4/14. Fill at the [facility] on [address].

## 2020-05-14 ENCOUNTER — Other Ambulatory Visit: Payer: Self-pay

## 2020-05-14 DIAGNOSIS — F900 Attention-deficit hyperactivity disorder, predominantly inattentive type: Secondary | ICD-10-CM

## 2020-05-14 MED ORDER — AMPHETAMINE-DEXTROAMPHET ER 20 MG PO CP24: 20.0000 mg | ORAL_CAPSULE | Freq: Two times a day (BID) | ORAL | 0 refills | Status: DC

## 2020-05-14 NOTE — Telephone Encounter (Signed)
Last refill 04/09/20 Next apt 06/18/20  Pended for Dr. Clovis Pu to send

## 2020-05-15 ENCOUNTER — Ambulatory Visit (AMBULATORY_SURGERY_CENTER): Payer: PRIVATE HEALTH INSURANCE | Admitting: Internal Medicine

## 2020-05-15 ENCOUNTER — Other Ambulatory Visit: Payer: Self-pay | Admitting: Internal Medicine

## 2020-05-15 ENCOUNTER — Other Ambulatory Visit: Payer: Self-pay

## 2020-05-15 ENCOUNTER — Encounter: Payer: Self-pay | Admitting: Internal Medicine

## 2020-05-15 VITALS — BP 125/65 | HR 64 | Temp 97.3°F | Resp 10 | Ht 73.0 in | Wt 251.0 lb

## 2020-05-15 DIAGNOSIS — D122 Benign neoplasm of ascending colon: Secondary | ICD-10-CM

## 2020-05-15 DIAGNOSIS — Z86010 Personal history of colonic polyps: Secondary | ICD-10-CM

## 2020-05-15 HISTORY — PX: COLONOSCOPY: SHX174

## 2020-05-15 MED ORDER — SODIUM CHLORIDE 0.9 % IV SOLN
500.0000 mL | Freq: Once | INTRAVENOUS | Status: DC
Start: 2020-05-15 — End: 2020-05-15

## 2020-05-15 NOTE — Patient Instructions (Addendum)
I found and removed one tiny polyp.  My office will arrange an appointment to treat the hemorrhoids.  Should be at least 5 years to repeat a colonoscopy.  I appreciate the opportunity to care for you. [name], MD, [facility]   Polyp, diverticulosis, and hemorrhoid handouts given to patient.  YOU HAD AN ENDOSCOPIC PROCEDURE TODAY AT [facility]:   Refer to the procedure report that was given to you for any specific questions about what was found during the examination.  If the procedure report does not answer your questions, please call your gastroenterologist to clarify.  If you requested that your care partner not be given the details of your procedure findings, then the procedure report has been included in a sealed envelope for you to review at your convenience later.  YOU SHOULD EXPECT: Some feelings of bloating in the abdomen. Passage of more gas than usual.  Walking can help get rid of the air that was put into your GI tract during the procedure and reduce the bloating. If you had a lower endoscopy (such as a colonoscopy or flexible sigmoidoscopy) you may notice spotting of blood in your stool or on the toilet paper. If you underwent a bowel prep for your procedure, you may not have a normal bowel movement for a few days.  Please Note:  You might notice some irritation and congestion in your nose or some drainage.  This is from the oxygen used during your procedure.  There is no need for concern and it should clear up in a day or so.  SYMPTOMS TO REPORT IMMEDIATELY:   Following lower endoscopy (colonoscopy or flexible sigmoidoscopy):  Excessive amounts of blood in the stool  Significant tenderness or worsening of abdominal pains  Swelling of the abdomen that is new, acute  Fever of 100F or higher  For urgent or emergent issues, a gastroenterologist can be reached at any hour by calling [phone]. Do not use MyChart messaging for urgent concerns.    DIET:   We do recommend a small meal at first, but then you may proceed to your regular diet.  Drink plenty of fluids but you should avoid alcoholic beverages for 24 hours.  ACTIVITY:  You should plan to take it easy for the rest of today and you should NOT DRIVE or use heavy machinery until tomorrow (because of the sedation medicines used during the test).    FOLLOW UP: Our staff will call the number listed on your records 48-72 hours following your procedure to check on you and address any questions or concerns that you may have regarding the information given to you following your procedure. If we do not reach you, we will leave a message.  We will attempt to reach you two times.  During this call, we will ask if you have developed any symptoms of COVID 19. If you develop any symptoms (ie: fever, flu-like symptoms, shortness of breath, cough etc.) before then, please call [phone].  If you test positive for Covid 19 in the 2 weeks post procedure, please call and report this information to [place].    If any biopsies were taken you will be contacted by phone or by letter within the next 1-3 weeks.  Please call us at [phone] if you have not heard about the biopsies in 3 weeks.    SIGNATURES/CONFIDENTIALITY: You and/or your care partner have signed paperwork which will be entered into your electronic medical record.  These signatures attest  to the fact that that the information above on your After Visit Summary has been reviewed and is understood.  Full responsibility of the confidentiality of this discharge information lies with you and/or your care-partner. 

## 2020-05-15 NOTE — Op Note (Signed)
[place] Patient Name: Aaron Stout Procedure Date: 05/15/2020 1:32 PM MRN: [id number] Endoscopist: [name] , MD Age: 65 Referring MD:  Date of Birth: 09/20/1955 Gender: Male Account #: [id number] Procedure:                Colonoscopy Indications:              Surveillance: Personal history of adenomatous                            polyps on last colonoscopy 3 years ago Medicines:                Propofol per Anesthesia, Monitored Anesthesia Care Procedure:                Pre-Anesthesia Assessment:                           - Prior to the procedure, a History and Physical                            was performed, and patient medications and                            allergies were reviewed. The patient's tolerance of                            previous anesthesia was also reviewed. The risks                            and benefits of the procedure and the sedation                            options and risks were discussed with the patient.                            All questions were answered, and informed consent                            was obtained. Prior Anticoagulants: The patient has                            taken no previous anticoagulant or antiplatelet                            agents. ASA Grade Assessment: II - A patient with                            mild systemic disease. After reviewing the risks                            and benefits, the patient was deemed in                            satisfactory condition to undergo the procedure.  After obtaining informed consent, the colonoscope                            was passed under direct vision. Throughout the                            procedure, the patient's blood pressure, pulse, and                            oxygen saturations were monitored continuously. The                            Olympus CF-HQ190L [id number]) Colonoscope was                             introduced through the anus and advanced to the the                            cecum, identified by appendiceal orifice and                            ileocecal valve. The colonoscopy was performed                            without difficulty. The patient tolerated the                            procedure well. The quality of the bowel                            preparation was adequate. The bowel preparation                            used was Miralax via split dose instruction. The                            ileocecal valve, appendiceal orifice, and rectum                            were photographed. Scope In: 1:51:26 PM Scope Out: 2:07:08 PM Scope Withdrawal Time: 0 hours 13 minutes 33 seconds  Total Procedure Duration: 0 hours 15 minutes 42 seconds  Findings:                 The perianal examination was normal.                           The digital rectal exam findings include enlarged                            prostate. Pertinent negatives include no anal                            lesion or abnormality.  A 2 mm polyp was found in the ascending colon. The                            polyp was sessile. The polyp was removed with a                            cold snare. Resection and retrieval were complete.                            Verification of patient identification for the                            specimen was done. Estimated blood loss was minimal.                           Multiple small and large-mouthed diverticula were                            found in the sigmoid colon and descending colon.                           Internal hemorrhoids were found.                           The exam was otherwise without abnormality on                            direct and retroflexion views. Complications:            No immediate complications. Estimated Blood Loss:     Estimated blood loss was minimal. Impression:               - Enlarged prostate found  on digital rectal exam.                           - One 2 mm polyp in the ascending colon, removed                            with a cold snare. Resected and retrieved.                           - Diverticulosis in the sigmoid colon and in the                            descending colon.                           - Internal hemorrhoids.                           - The examination was otherwise normal on direct                            and retroflexion views. Recommendation:           -  Patient has a contact number available for                            emergencies. The signs and symptoms of potential                            delayed complications were discussed with the                            patient. Return to normal activities tomorrow.                            Written discharge instructions were provided to the                            patient.                           - Resume previous diet.                           - Continue present medications.                           - Repeat colonoscopy is recommended for                            surveillance. The colonoscopy date will be                            determined after pathology results from today's                            exam become available for review.                           - Carey E Obrian Bulson, MD 05/15/2020 2:17:13 PM This report has been signed electronically.

## 2020-05-15 NOTE — Progress Notes (Signed)
Medical history reviewed with no changes noted. VS assessed by C.W 

## 2020-05-15 NOTE — Progress Notes (Signed)
Called to room to assist during endoscopic procedure.  Patient ID and intended procedure confirmed with present staff. Received instructions for my participation in the procedure from the performing physician.  

## 2020-05-15 NOTE — Progress Notes (Signed)
Report given to PACU, vss 

## 2020-05-20 ENCOUNTER — Telehealth: Payer: Self-pay

## 2020-05-20 NOTE — Telephone Encounter (Signed)
  Follow up Call-  Call back number 05/15/2020  Post procedure Call Back phone  # [phone]  Permission to leave phone message Yes  Some recent data might be hidden     Left message

## 2020-05-20 NOTE — Telephone Encounter (Signed)
Left message on follow up call. 

## 2020-05-26 ENCOUNTER — Encounter: Payer: Self-pay | Admitting: Internal Medicine

## 2020-06-11 ENCOUNTER — Encounter: Payer: Self-pay | Admitting: Internal Medicine

## 2020-06-11 ENCOUNTER — Ambulatory Visit (INDEPENDENT_AMBULATORY_CARE_PROVIDER_SITE_OTHER): Payer: PRIVATE HEALTH INSURANCE | Admitting: Internal Medicine

## 2020-06-11 VITALS — BP 134/68 | HR 78 | Ht 73.0 in | Wt 251.0 lb

## 2020-06-11 DIAGNOSIS — K641 Second degree hemorrhoids: Secondary | ICD-10-CM | POA: Diagnosis not present

## 2020-06-11 NOTE — Assessment & Plan Note (Signed)
All 3 columns banded  The patient was discharged home without pain or other issues.  Dietary and behavioral recommendations were given and along with follow-up instructions.     The patient will return as needed for  follow-up and possible additional banding as required. No complications were encountered and the patient tolerated the procedure well.

## 2020-06-11 NOTE — Patient Instructions (Signed)
HEMORRHOID BANDING PROCEDURE    FOLLOW-UP CARE   1. The procedure you have had should have been relatively painless since the banding of the area involved does not have nerve endings and there is no pain sensation.  The rubber band cuts off the blood supply to the hemorrhoid and the band may fall off as soon as 48 hours after the banding (the band may occasionally be seen in the toilet bowl following a bowel movement). You may notice a temporary feeling of fullness in the rectum which should respond adequately to plain Tylenol or Motrin.  2. Following the banding, avoid strenuous exercise that evening and resume full activity the next day.  A sitz bath (soaking in a warm tub) or bidet is soothing, and can be useful for cleansing the area after bowel movements.     3. To avoid constipation, take two tablespoons of natural wheat bran, natural oat bran, flax, Benefiber or any over the counter fiber supplement and increase your water intake to 7-8 glasses daily.    4. Unless you have been prescribed anorectal medication, do not put anything inside your rectum for two weeks: No suppositories, enemas, fingers, etc.  5. Occasionally, you may have more bleeding than usual after the banding procedure.  This is often from the untreated hemorrhoids rather than the treated one.  Don't be concerned if there is a tablespoon or so of blood.  If there is more blood than this, lie flat with your bottom higher than your head and apply an ice pack to the area. If the bleeding does not stop within a half an hour or if you feel faint, call our office at [phone] or go to the emergency room.  6. Problems are not common; however, if there is a substantial amount of bleeding, severe pain, chills, fever or difficulty passing urine (very rare) or other problems, you should call us at [phone] or report to the nearest emergency room.  7. Do not stay seated continuously for more than 2-3 hours for a day or two  after the procedure.  Tighten your buttock muscles 10-15 times every two hours and take 10-15 deep breaths every 1-2 hours.  Do not spend more than a few minutes on the toilet if you cannot empty your bowel; instead re-visit the toilet at a later time.    Follow up as needed.   I appreciate the opportunity to care for you. Ronney Lion, [facility]

## 2020-06-11 NOTE — Progress Notes (Addendum)
   Aaron Stout 65 y.o. [birth year] [id number]   HEMORRHOID LIGATION  Signs and symptoms are occasional straining sometimes prolonged toileting, mild prolapse rectal bleeding anal discomfort and itching these are intermittent.  He recently had a colonoscopy showing his internal hemorrhoids and a diminutive adenoma.  He requested evaluation and treatment of hemorrhoids given these signs and symptoms that are bothersome.   Rectal exam shows a moderately long anal canal with thickening and spasm.  Nontender no mass.  The rectal vault is capacious.  Anoscopy demonstrates grade 2 internal hemorrhoids in all 3 positions.    PROCEDURE NOTE: The patient presents with symptomatic grade 2 hemorrhoids, requesting rubber band ligation of his/her hemorrhoidal disease.  All risks, benefits and alternative forms of therapy were described and informed consent was obtained.   The anorectum was pre-medicated with 0.125% nitroglycerin and 5% lidocaine The decision was made to band all 3 columns of internal hemorrhoids, and the [place] was used to perform band ligation without complication.  Digital anorectal examination was then performed to assure proper positioning of the band, and to adjust the banded tissue as required.   Prolapsed internal hemorrhoids, grade 2 All 3 columns banded  The patient was discharged home without pain or other issues.  Dietary and behavioral recommendations were given and along with follow-up instructions.     The patient will return as needed for  follow-up and possible additional banding as required. No complications were encountered and the patient tolerated the procedure well.

## 2020-06-15 ENCOUNTER — Telehealth: Payer: Self-pay | Admitting: Psychiatry

## 2020-06-15 ENCOUNTER — Other Ambulatory Visit: Payer: Self-pay

## 2020-06-15 DIAGNOSIS — F900 Attention-deficit hyperactivity disorder, predominantly inattentive type: Secondary | ICD-10-CM

## 2020-06-15 MED ORDER — AMPHETAMINE-DEXTROAMPHET ER 20 MG PO CP24: 20.0000 mg | ORAL_CAPSULE | Freq: Two times a day (BID) | ORAL | 0 refills | Status: DC

## 2020-06-15 NOTE — Telephone Encounter (Signed)
His last refill on 05/14/2020 was name brand Adderall but previous refill on 04/09/2020 was generic. Has upcoming apt on 06/18/2020  Will pend for generic as requested

## 2020-06-15 NOTE — Telephone Encounter (Signed)
Pt would like a refill on generic Adderall 20mg . [facility] [address].

## 2020-06-15 NOTE — Telephone Encounter (Signed)
Please review

## 2020-06-18 ENCOUNTER — Encounter: Payer: Self-pay | Admitting: Psychiatry

## 2020-06-18 ENCOUNTER — Other Ambulatory Visit: Payer: Self-pay

## 2020-06-18 ENCOUNTER — Ambulatory Visit (INDEPENDENT_AMBULATORY_CARE_PROVIDER_SITE_OTHER): Payer: PRIVATE HEALTH INSURANCE | Admitting: Psychiatry

## 2020-06-18 DIAGNOSIS — F411 Generalized anxiety disorder: Secondary | ICD-10-CM | POA: Diagnosis not present

## 2020-06-18 DIAGNOSIS — F5105 Insomnia due to other mental disorder: Secondary | ICD-10-CM

## 2020-06-18 DIAGNOSIS — F4310 Post-traumatic stress disorder, unspecified: Secondary | ICD-10-CM | POA: Diagnosis not present

## 2020-06-18 DIAGNOSIS — F900 Attention-deficit hyperactivity disorder, predominantly inattentive type: Secondary | ICD-10-CM | POA: Diagnosis not present

## 2020-06-18 MED ORDER — QUETIAPINE FUMARATE 25 MG PO TABS: 75.0000 mg | ORAL_TABLET | Freq: Every day | ORAL | 1 refills | Status: DC

## 2020-06-18 MED ORDER — AMPHETAMINE-DEXTROAMPHET ER 20 MG PO CP24: 20.0000 mg | ORAL_CAPSULE | Freq: Two times a day (BID) | ORAL | 0 refills | Status: DC

## 2020-06-18 NOTE — Progress Notes (Signed)
Aaron Stout [id number] September 12, 1955 65 y.o.  Subjective:   Patient ID:  Aaron Stout is a 65 y.o. (DOB 06-06-1955) male.  Chief Complaint:  Chief Complaint  Patient presents with  . Follow-up    Mood and anxiety  . ADHD    Depression        Associated symptoms include fatigue.  Associated symptoms include no decreased concentration and no suicidal ideas.  Aaron Stout presents to the office today for follow-up of mood and anxiety and sleep.  At visit in 2020had residual anxiety and it was suggested he disc with cardiologist the use of a beta blocker metoprolol and it helped BP and  anxiety DT multiple failed anxiety meds.  Has been generally good.    At  visit 10/27/19  Lexapro was increased from 5 to 7.5mg  dailty for anxiety which is chronic. Thinks the anxiety is a helpful.  I  Chronic anxiety and stress at work.  Evaluations pending at work.  Less obsessing about that.  Has tried 10mg  Lexapro and he feels it was too much.  visit October 2020 without med changes.    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   Still CO sweating easily. Pretty good overall.  No major changes since here.  Same old chronic tiredness and easily OOB.  Still a little anxiety hangs on.  Not horrible and able to function and keep on going.    12/19/2019 appointment with the following noted: Fine overall.  Generally steady with mood and other factors. Chronic tiredness without change.  Trying to manage stress as much as possible. Stress Hudson back in the house and some lability continues with anger outbursts.  He always finds someone at work he doesn't like and starts ruminating on it. SE mild hangover. Mild hangover with Seroquel which helps the sleep. Makes him sleepy in 60-90 mins. Chronic sweating and SOB. Plan: No med changes  06/18/2020 appointment with following noted: No Covid.   Knee surgery 12/21. Otherwise health is good.       Pt reports that mood is occ irritable and Anxious and describes anxiety as mild-moderate like if too many things coming at him at once.   Anxiety symptoms include: Excessive Worry,. Pt reports no sleep issues. Pt reports that appetite is good. Pt reports that energy is good and good. Concentration is good. 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  He's aware and tries to catch it.    Past Psychiatric Medication Trials: Lexapro 10 SE, Zoloft,  Emsam,  Welllbutrin Adderall, Ritalin , Concerta, Vyvanse, seroquel 75 for sleep, perphenazine, Depakote,  Vraylar 1.5, lithium 600, Trileptal, Buspar dizzy, lamotrigine,  Belsomra,   Review of Systems:  Review of Systems  Constitutional: Positive for fatigue.       Long history of sweating  Respiratory: Positive for shortness of breath. Negative for chest tightness.   Musculoskeletal: Positive for joint swelling.  Neurological: Negative for dizziness, tremors and weakness.  Psychiatric/Behavioral: Positive for depression. Negative for agitation, behavioral problems, confusion, decreased concentration, dysphoric mood, hallucinations, self-injury, sleep disturbance and suicidal ideas. The patient is nervous/anxious. The patient is not hyperactive.     Medications: I have reviewed the patient's current medications.  Current Outpatient Medications  Medication Sig Dispense Refill  . albuterol (PROVENTIL HFA;VENTOLIN HFA) 108 (90 Base) MCG/ACT inhaler Inhale  2 puffs into the lungs 2 (two) times daily. (Patient taking differently: Inhale 2 puffs into the lungs as needed.) 1 Inhaler 3  . AMBULATORY NON FORMULARY MEDICATION Take 2 tablets by mouth at bedtime. Serotrex    . amLODipine (NORVASC) 5 MG tablet Take 1 tablet (5 mg total) by mouth daily. 90 tablet 3  . [START ON 07/16/2020] amphetamine-dextroamphetamine (ADDERALL XR) 20 MG 24 hr capsule Take 1  capsule (20 mg total) by mouth 2 (two) times daily. 60 capsule 0  . [START ON 08/13/2020] amphetamine-dextroamphetamine (ADDERALL XR) 20 MG 24 hr capsule Take 1 capsule (20 mg total) by mouth in the morning and at bedtime. 60 capsule 0  . Ascorbic Acid (VITAMIN C) 1000 MG tablet Take 1,000 mg by mouth 2 (two) times daily.    Marland Kitchen aspirin 81 MG tablet Take 81 mg by mouth at bedtime.     Marland Kitchen azelastine (ASTELIN) 0.1 % nasal spray Place 1 spray into both nostrils as needed for rhinitis. Use in each nostril as directed    . Cholecalciferol (VITAMIN D) 2000 UNITS tablet Take 3,000 Units by mouth daily.     . clotrimazole-betamethasone (LOTRISONE) cream Apply 1 application topically 2 (two) times daily as needed (for psoriasis).    . Coenzyme Q10 (CO Q-10) 100 MG CAPS Take 100 mg by mouth daily.    . colestipol (COLESTID) 5 g packet Take one and a half packets daily (Patient taking differently: 7.5 g. Take one and a half packets daily) 135 packet 3  . Cyanocobalamin (VITAMIN B 12 PO) Take 5,000 mcg by mouth at bedtime.     Marland Kitchen DHEA 25 MG CAPS Take 25 mg by mouth daily.     Marland Kitchen escitalopram (LEXAPRO) 5 MG tablet TAKE 1.5 TABLETS BY MOUTH DAILY. 135 tablet 1  . fexofenadine (ALLEGRA) 180 MG tablet Take 180 mg by mouth at bedtime.    . fluticasone (FLONASE) 50 MCG/ACT nasal spray Place 1 spray into both nostrils as needed for allergies or rhinitis.    Marland Kitchen L-Lysine 1000 MG TABS Take 1,000 mg by mouth 2 (two) times daily.     . Melatonin 10 MG TABS Take 5 mg by mouth at bedtime.    . metoprolol succinate (TOPROL-XL) 50 MG 24 hr tablet Take 1 tablet (50 mg total) by mouth daily. Take with or immediately following a meal. 90 tablet 3  . Multiple Vitamin (MULTIVITAMIN) tablet Take 1 tablet by mouth daily.    . Omega-3 Fatty Acids (FISH OIL) 1000 MG CAPS Take 2,000 mg by mouth 2 (two) times daily.     [place] Bicarbonate (ZEGERID) 20-1100 MG CAPS capsule Take 1 capsule by mouth 2 (two) times daily.     Marland Kitchen OVER  THE COUNTER MEDICATION Take 100 mg by mouth daily. Pregnenolone supplement    . Psyllium (FIBER) 0.52 G CAPS Take 4.16 g by mouth at bedtime.     . Quercetin 250 MG TABS Take 2 tablets by mouth 2 (two) times daily.    . Turmeric 500 MG CAPS Take 500 mg by mouth 2 (two) times daily.     . valACYclovir (VALTREX) 500 MG tablet Take 500 mg by mouth 2 (two) times daily.     Marland Kitchen amphetamine-dextroamphetamine (ADDERALL XR) 20 MG 24 hr capsule Take 1 capsule (20 mg total) by mouth 2 (two) times daily. 60 capsule 0  . QUEtiapine (SEROQUEL) 25 MG tablet Take 3 tablets (75 mg total) by mouth at bedtime. 270 tablet 1  No current facility-administered medications for this visit.    Medication Side Effects: None ? sweating  Allergies:  Allergies  Allergen Reactions  . Duloxetine Hcl Other (See Comments)    drowsiness  . Ibuprofen Hives  . Penicillins Other (See Comments)    Unknown Has patient had a PCN reaction causing immediate rash, facial/tongue/throat swelling, SOB or lightheadedness with hypotension: Unknown Has patient had a PCN reaction causing severe rash involving mucus membranes or skin necrosis: Unknown Has patient had a PCN reaction that required hospitalization: Unknown Has patient had a PCN reaction occurring within the last 10 years: No If all of the above answers are "NO", then may proceed with Cephalosporin use.  Childhood response   . Dilaudid [Hydromorphone Hcl] Nausea And Vomiting    Past Medical History:  Diagnosis Date  . Adenomatous colon polyp 2001  . Allergic rhinitis   . ALLERGIC RHINITIS 05/18/2007   Qualifier: Diagnosis of  By: Ronnald Ramp RN, CGRN, Sheri    . Allergy   . Anxiety   . Arthritis    arthritis-hands  . Asthma   . ASTHMA 05/18/2007   Qualifier: Diagnosis of  By: Ronnald Ramp RN, CGRN, Sheri    . Bipolar 1 disorder ([place])   . CAD (coronary artery disease) 10/08/2014  . Cancer ([place])    hx skin cancer  . Chest tightness   . Chronic fatigue syndrome   . Chronic  headaches    d/t old neck injury  . Complication of anesthesia    x1 episode "panic attack" awakening-none in recent years  . Coronary artery disease   . Depression   . DEPRESSION 05/18/2007   Qualifier: Diagnosis of  By: Ronnald Ramp RN, CGRN, Sheri    . Diverticulosis   . Dysrhythmia    was told benign by cardiologist  . GASTROESOPHAGEAL REFLUX DISEASE 05/18/2007   Qualifier: Diagnosis of  By: Ronnald Ramp RN, [place], [place]    . GERD (gastroesophageal reflux disease)   . HEADACHE, CHRONIC 05/18/2007   Qualifier: Diagnosis of  By: Ronnald Ramp RN, CGRN, Sheri    . Heart murmur    benign   . History of degenerative disc disease   . HOH (hard of hearing)    slight-bilateral  . Hypercholesterolemia   . Hyperlipidemia 05/18/2007   Qualifier: Diagnosis of  By: Ronnald Ramp RN, CGRN, Sheri    . Hypertension   . IBS (irritable bowel syndrome)   . Internal and external hemorrhoids without complication   . Internal hemorrhoids with other complication 10/09/4625  . Irritable bowel syndrome 05/18/2007   Qualifier: Diagnosis of  By: Ronnald Ramp RN, CGRN, Sheri    . Lyme disease   . Personal history of adenomatous colonic polyps 05/18/2007   2001, 2004, 2006, 2009 (last with one small adenoma) Max 4 polyps in past with largest 7 mm (both in 2004)  09/11/2013 7 small polyps adenomas  02/2017 5 adeoimas max 10 mm repeat colon 03/2020    . PONV (postoperative nausea and vomiting)   . Prostatitis   . PROSTATITIS, CHRONIC 05/18/2007   Qualifier: Diagnosis of  By: Ronnald Ramp RN, CGRN, Sheri    . Radiculopathy of cervical spine   . Sleep apnea    better s/p UPP-no problems now  . Stiff neck   . Syncope    last episode 1 yr ago-evaluated by cardiologist-negative findings-?related to low B/p (hydration tends to help).  . Testicular hypofunction   . Vitamin D deficiency     Family History  Problem Relation Age of Onset  .  Breast cancer Mother 47  . Heart failure Father 91  . Heart disease Other        grandfather/uncle  . Alcoholism  Other        greatgrandfather/uncle    . Colon polyps Other        uncle  . CAD Brother   . CAD Brother   . Colon cancer Neg Hx   . Esophageal cancer Neg Hx   . Rectal cancer Neg Hx   . Stomach cancer Neg Hx     Social History   Socioeconomic History  . Marital status: Married    Spouse name: Not on file  . Number of children: 2  . Years of education: Not on file  . Highest education level: Not on file  Occupational History  . Occupation: Designer, jewellery: [employer]  Tobacco Use  . Smoking status: Former Smoker    Packs/day: 0.50    Years: 7.00    Pack years: 3.50    Types: Cigarettes    Quit date: 07/06/1980    Years since quitting: 39.9  . Smokeless tobacco: Never Used  Vaping Use  . Vaping Use: Never used  Substance and Sexual Activity  . Alcohol use: No  . Drug use: No  . Sexual activity: Yes  Other Topics Concern  . Not on file  Social History Narrative  . Not on file   Social Determinants of Health   Financial Resource Strain: Not on file  Food Insecurity: Not on file  Transportation Needs: Not on file  Physical Activity: Not on file  Stress: Not on file  Social Connections: Not on file  Intimate Partner Violence: Not on file    Past Medical History, Surgical history, Social history, and Family history were reviewed and updated as appropriate.   Jake dx bipolar on lithium with tremor.  Answered questions about this.  Please see review of systems for further details on the patient's review from today.   Objective:   Physical Exam:  There were no vitals taken for this visit.  Physical Exam Constitutional:      General: He is not in acute distress.    Appearance: He is well-developed.  Musculoskeletal:        General: No deformity.  Neurological:     Mental Status: He is alert and oriented to person, place, and time.     Motor: No tremor.     Coordination: Coordination normal.     Gait: Gait normal.  Psychiatric:         Attention and Perception: He is inattentive. He does not perceive auditory hallucinations.        Mood and Affect: Mood is anxious. Mood is not depressed. Affect is not labile, blunt, angry, tearful or inappropriate.        Speech: Speech normal.        Behavior: Behavior normal.        Thought Content: Thought content normal. Thought content does not include homicidal or suicidal ideation. Thought content does not include homicidal or suicidal plan.        Cognition and Memory: Cognition normal.        Judgment: Judgment normal.     Comments: Insight intact. No auditory or visual hallucinations.  Irritability controlled    Get's SOB walking from one building to the next.  Lab Review:     Component Value Date/Time   NA 140 09/12/2017 0556   NA 140 09/11/2017 1425  K 4.0 09/12/2017 0556   CL 105 09/12/2017 0556   CO2 29 09/12/2017 0556   GLUCOSE 103 (H) 09/12/2017 0556   BUN 17 09/12/2017 0556   BUN 16 09/11/2017 1425   CREATININE 1.24 09/12/2017 0556   CALCIUM 9.8 09/12/2017 0556   PROT 6.9 09/26/2014 1543   ALBUMIN 3.8 09/26/2014 1543   AST 26 09/26/2014 1543   ALT 28 09/26/2014 1543   ALKPHOS 36 (L) 09/26/2014 1543   BILITOT 0.6 09/26/2014 1543   GFRNONAA >60 09/12/2017 0556   GFRAA >60 09/12/2017 0556       Component Value Date/Time   WBC 8.7 09/12/2017 0556   RBC 4.62 09/12/2017 0556   HGB 14.5 09/12/2017 0556   HGB 15.1 09/11/2017 1425   HCT 43.7 09/12/2017 0556   HCT 43.6 09/11/2017 1425   PLT 209 09/12/2017 0556   PLT 223 09/11/2017 1425   MCV 94.6 09/12/2017 0556   MCV 94 09/11/2017 1425   MCH 31.4 09/12/2017 0556   MCHC 33.2 09/12/2017 0556   RDW 13.0 09/12/2017 0556   RDW 14.1 09/11/2017 1425   LYMPHSABS 2.1 09/26/2014 1543   MONOABS 0.6 09/26/2014 1543   EOSABS 0.5 09/26/2014 1543   BASOSABS 0.0 09/26/2014 1543    No results found for: POCLITH, LITHIUM   No results found for: PHENYTOIN, PHENOBARB, VALPROATE, CBMZ   .res Assessment: Plan:     Generalized anxiety disorder  PTSD (post-traumatic stress disorder) - Plan: QUEtiapine (SEROQUEL) 25 MG tablet  Attention deficit hyperactivity disorder (ADHD), predominantly inattentive type - Plan: amphetamine-dextroamphetamine (ADDERALL XR) 20 MG 24 hr capsule, amphetamine-dextroamphetamine (ADDERALL XR) 20 MG 24 hr capsule, amphetamine-dextroamphetamine (ADDERALL XR) 20 MG 24 hr capsule  Insomnia due to mental condition - Plan: QUEtiapine (SEROQUEL) 25 MG tablet   Greater than 50% of face to face time with patient was spent on counseling and coordination of care. We discussed the change and benefit from the increase in the Lexapro for the anxiety to 7.5 mg daily.  This is the maximum tolerated dosage at this point.  Suspect residual anxiety is still a portion of what he's dealing with, but he's failed multiple meds for anxiety.  He's not sig depressed at this time.  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   Benefit from the Adderall. Satisfied with current dose   He doesn't Stout changes in that.  Doubt he would get adequate duration with once daily Adderall XR.  Disc way to figure out if it contributes to sweating.  Sleep benefit from the Seroquel 75 mg is worth it. Plenty of sleep  Disc potential chronic fatigue is related to a post-viral syndrome.  Disc concerns with son's recent mental health crisis.  Disc value of vitamin D for mental health and suggest trial of  5000U daily for the winter to see energy is a little better.  This appt 30 mins  FU 6 mos  Lynder Parents, MD, DFAPA     Please see After Visit Summary for patient specific instructions.  No future appointments.  No orders of the defined types were placed in this encounter.     -------------------------------

## 2020-08-18 ENCOUNTER — Telehealth: Payer: Self-pay

## 2020-08-18 NOTE — Telephone Encounter (Signed)
Prior Approval received for AMPHETAMINE-DEXTROAMPHETAMINE 20 MG #60 effective 08/18/2020-08/18/2021 with [facility] ID# [id number]  PA# [id number]   Pt is aware of approval

## 2020-09-08 ENCOUNTER — Other Ambulatory Visit: Payer: Self-pay | Admitting: Internal Medicine

## 2020-09-08 DIAGNOSIS — E041 Nontoxic single thyroid nodule: Secondary | ICD-10-CM

## 2020-09-21 ENCOUNTER — Ambulatory Visit
Admission: RE | Admit: 2020-09-21 | Discharge: 2020-09-21 | Disposition: A | Discharge status: Home / Self Care | Payer: No Typology Code available for payment source | Source: Ambulatory Visit | Attending: Internal Medicine | Admitting: Internal Medicine

## 2020-09-21 DIAGNOSIS — E041 Nontoxic single thyroid nodule: Secondary | ICD-10-CM

## 2020-10-21 ENCOUNTER — Telehealth: Payer: Self-pay | Admitting: Psychiatry

## 2020-10-21 ENCOUNTER — Other Ambulatory Visit: Payer: Self-pay

## 2020-10-21 DIAGNOSIS — F900 Attention-deficit hyperactivity disorder, predominantly inattentive type: Secondary | ICD-10-CM

## 2020-10-21 MED ORDER — AMPHETAMINE-DEXTROAMPHET ER 20 MG PO CP24: 20.0000 mg | ORAL_CAPSULE | Freq: Two times a day (BID) | ORAL | 0 refills | Status: DC

## 2020-10-21 NOTE — Telephone Encounter (Signed)
Next visit is 12/23/20. Requesting refill on Adderall 20 mg XR 2/day [id number] [address]  Phone:  [phone] Fax:  [phone]

## 2020-10-21 NOTE — Telephone Encounter (Signed)
Pended.

## 2020-10-23 ENCOUNTER — Other Ambulatory Visit: Payer: Self-pay | Admitting: Internal Medicine

## 2020-10-23 DIAGNOSIS — E041 Nontoxic single thyroid nodule: Secondary | ICD-10-CM

## 2020-11-03 ENCOUNTER — Telehealth: Payer: Self-pay | Admitting: Psychiatry

## 2020-11-03 ENCOUNTER — Other Ambulatory Visit: Payer: Self-pay | Admitting: Psychiatry

## 2020-11-03 DIAGNOSIS — F900 Attention-deficit hyperactivity disorder, predominantly inattentive type: Secondary | ICD-10-CM

## 2020-11-03 MED ORDER — AMPHETAMINE-DEXTROAMPHET ER 20 MG PO CP24: 20.0000 mg | ORAL_CAPSULE | Freq: Two times a day (BID) | ORAL | 0 refills | Status: DC

## 2020-11-03 NOTE — Telephone Encounter (Signed)
There are no messages from him on Friday.Pt was upset and said this is not the first time he has called and his message was not relayed.The pharmacy gave him only 22 pills on 8/17 because that's all they had and they told him to call back so that we can send the remainder amount for him to fill.

## 2020-11-03 NOTE — Telephone Encounter (Signed)
Pt called on Friday asking for aa new script of adderall xr 20 mg. He was only able to get 22 from the script sent in august. So the pharmacy  needs a new script of with a quantity of 38 sent in to get him through to September. He has been out since Friday. [facility] [address]

## 2020-11-03 NOTE — Telephone Encounter (Signed)
Not sure what the problem is?  I sent in 3 RX just now for the next 90 days with full 30 day RX supplies for each. Vivien Rota please let him know it was sent.

## 2020-11-04 NOTE — Telephone Encounter (Signed)
Pt was able to get rx

## 2020-11-21 ENCOUNTER — Other Ambulatory Visit: Payer: Self-pay | Admitting: Psychiatry

## 2020-11-21 DIAGNOSIS — F4310 Post-traumatic stress disorder, unspecified: Secondary | ICD-10-CM

## 2020-11-21 DIAGNOSIS — F411 Generalized anxiety disorder: Secondary | ICD-10-CM

## 2020-11-24 ENCOUNTER — Inpatient Hospital Stay: Admission: RE | Admit: 2020-11-24 | Payer: No Typology Code available for payment source | Source: Ambulatory Visit

## 2020-12-01 ENCOUNTER — Ambulatory Visit
Admission: RE | Admit: 2020-12-01 | Discharge: 2020-12-01 | Disposition: A | Discharge status: Home / Self Care | Payer: No Typology Code available for payment source | Source: Ambulatory Visit | Attending: Internal Medicine | Admitting: Internal Medicine

## 2020-12-01 ENCOUNTER — Other Ambulatory Visit (HOSPITAL_COMMUNITY)
Admission: RE | Admit: 2020-12-01 | Discharge: 2020-12-01 | Disposition: A | Discharge status: Home / Self Care | Payer: No Typology Code available for payment source | Source: Ambulatory Visit | Attending: Internal Medicine | Admitting: Internal Medicine

## 2020-12-01 DIAGNOSIS — E041 Nontoxic single thyroid nodule: Secondary | ICD-10-CM | POA: Diagnosis not present

## 2020-12-03 LAB — CYTOLOGY - NON PAP

## 2020-12-19 ENCOUNTER — Other Ambulatory Visit: Payer: Self-pay | Admitting: Psychiatry

## 2020-12-19 DIAGNOSIS — F4310 Post-traumatic stress disorder, unspecified: Secondary | ICD-10-CM

## 2020-12-19 DIAGNOSIS — F5105 Insomnia due to other mental disorder: Secondary | ICD-10-CM

## 2020-12-21 NOTE — Telephone Encounter (Signed)
Appt 10/19

## 2020-12-23 ENCOUNTER — Ambulatory Visit: Payer: PRIVATE HEALTH INSURANCE | Admitting: Psychiatry

## 2021-01-07 ENCOUNTER — Encounter (HOSPITAL_COMMUNITY): Payer: Self-pay

## 2021-02-22 ENCOUNTER — Other Ambulatory Visit: Payer: Self-pay | Admitting: Psychiatry

## 2021-02-22 DIAGNOSIS — F411 Generalized anxiety disorder: Secondary | ICD-10-CM

## 2021-02-22 DIAGNOSIS — F4310 Post-traumatic stress disorder, unspecified: Secondary | ICD-10-CM

## 2021-02-25 ENCOUNTER — Ambulatory Visit (INDEPENDENT_AMBULATORY_CARE_PROVIDER_SITE_OTHER): Payer: No Typology Code available for payment source | Admitting: Psychiatry

## 2021-02-25 ENCOUNTER — Encounter: Payer: Self-pay | Admitting: Psychiatry

## 2021-02-25 ENCOUNTER — Other Ambulatory Visit: Payer: Self-pay

## 2021-02-25 DIAGNOSIS — F4310 Post-traumatic stress disorder, unspecified: Secondary | ICD-10-CM | POA: Diagnosis not present

## 2021-02-25 DIAGNOSIS — F411 Generalized anxiety disorder: Secondary | ICD-10-CM

## 2021-02-25 DIAGNOSIS — F900 Attention-deficit hyperactivity disorder, predominantly inattentive type: Secondary | ICD-10-CM

## 2021-02-25 DIAGNOSIS — F5105 Insomnia due to other mental disorder: Secondary | ICD-10-CM | POA: Diagnosis not present

## 2021-02-25 MED ORDER — QUETIAPINE FUMARATE 25 MG PO TABS: 75.0000 mg | ORAL_TABLET | Freq: Every day | ORAL | 1 refills | Status: DC

## 2021-02-25 MED ORDER — AMPHETAMINE-DEXTROAMPHET ER 20 MG PO CP24: 20.0000 mg | ORAL_CAPSULE | Freq: Two times a day (BID) | ORAL | 0 refills | Status: DC

## 2021-02-25 MED ORDER — ESCITALOPRAM OXALATE 5 MG PO TABS: 7.5000 mg | ORAL_TABLET | Freq: Every day | ORAL | 1 refills | Status: DC

## 2021-02-25 NOTE — Progress Notes (Signed)
Aaron Stout [id number] [birth year] 65 y.o.  Subjective:   Patient ID:  Aaron Stout is a 65 y.o. (DOB Sep 08, 1955) male.  Chief Complaint:  Chief Complaint  Patient presents with   Follow-up   ADHD   Fatigue    Depression        Associated symptoms include fatigue.  Associated symptoms include no decreased concentration and no suicidal ideas. Aaron Stout presents to the office today for follow-up of mood and anxiety and sleep.  At visit in 2020had residual anxiety and it was suggested he disc with cardiologist the use of a beta blocker metoprolol and it helped BP and  anxiety DT multiple failed anxiety meds.  Has been generally good.    At  visit 10/27/19  Lexapro was increased from 5 to 7.5mg  dailty for anxiety which is chronic. Thinks the anxiety is a helpful.  I  Chronic anxiety and stress at work.  Evaluations pending at work.  Less obsessing about that.  Has tried 10mg  Lexapro and he feels it was too much.  visit October 2020 without med changes.    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   Still CO sweating easily. Pretty good overall.  No major changes since here.  Same old chronic tiredness and easily OOB.  Still a little anxiety hangs on.  Not horrible and able to function and keep on going.    12/19/2019 appointment with the following noted: Fine overall.  Generally steady with mood and other factors. Chronic tiredness without change.  Trying to manage stress as much as possible. Stress Hudson back in the house and some lability continues with anger outbursts.  He always finds someone at work he doesn't like and starts ruminating on it. SE mild hangover. Mild hangover with Seroquel which helps the sleep. Makes him sleepy in 60-90 mins. Chronic sweating and SOB. Plan: No med changes  06/18/2020 appointment with following noted: No Covid.   Knee surgery 12/21. Otherwise health is good.    02/25/21  appt noted: Taking lexapro 7.5 mg , Adderall and quetiapine 75 mg HS. No SE.  Sleep pretty good. Tiredness worse in the day.   Pt reports that mood is occ irritable and Anxious and describes anxiety as mild-moderate like if too many things coming at him at once.   Anxiety symptoms include: Excessive Worry,. Pt reports no sleep issues. Pt reports that appetite is good. Pt reports that energy is good and good. Concentration is good. 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  He's aware and tries to catch it.    Past Psychiatric Medication Trials: Lexapro 10 SE, Zoloft,  Emsam,  Welllbutrin Adderall, Ritalin , Concerta, Vyvanse, seroquel 75 for sleep, perphenazine, Depakote,  Vraylar 1.5, lithium 600, Trileptal, Buspar dizzy, lamotrigine,  Belsomra,   Review of Systems:  Review of Systems  Constitutional:  Positive for fatigue.       Long history of sweating  Respiratory:  Positive for shortness of breath. Negative for chest tightness.   Cardiovascular:  Negative for palpitations.  Musculoskeletal:  Positive for joint swelling.  Neurological:  Negative for dizziness, tremors and weakness.  Psychiatric/Behavioral:  Negative for agitation, behavioral problems, confusion, decreased concentration, dysphoric mood, hallucinations, self-injury, sleep disturbance and suicidal ideas. The patient is nervous/anxious. The patient is not hyperactive.    Medications:  I have reviewed the patient's current medications.  Current Outpatient Medications  Medication Sig Dispense Refill   albuterol (PROVENTIL HFA;VENTOLIN HFA) 108 (90 Base) MCG/ACT inhaler Inhale 2 puffs into the lungs 2 (two) times daily. (Patient taking differently: Inhale 2 puffs into the lungs as needed.) 1 Inhaler 3   AMBULATORY NON FORMULARY MEDICATION Take 2 tablets by mouth at bedtime. Serotrex     amLODipine (NORVASC) 5 MG tablet Take 1  tablet (5 mg total) by mouth daily. 90 tablet 3   Ascorbic Acid (VITAMIN C) 1000 MG tablet Take 1,000 mg by mouth 2 (two) times daily.     aspirin 81 MG tablet Take 81 mg by mouth at bedtime.      azelastine (ASTELIN) 0.1 % nasal spray Place 1 spray into both nostrils as needed for rhinitis. Use in each nostril as directed     Cholecalciferol (VITAMIN D) 2000 UNITS tablet Take 3,000 Units by mouth daily.      clotrimazole-betamethasone (LOTRISONE) cream Apply 1 application topically 2 (two) times daily as needed (for psoriasis).     Coenzyme Q10 (CO Q-10) 100 MG CAPS Take 100 mg by mouth daily.     colestipol (COLESTID) 5 g packet Take one and a half packets daily (Patient taking differently: 7.5 g. Take one and a half packets daily) 135 packet 3   Cyanocobalamin (VITAMIN B 12 PO) Take 5,000 mcg by mouth at bedtime.      DHEA 25 MG CAPS Take 25 mg by mouth daily.      fexofenadine (ALLEGRA) 180 MG tablet Take 180 mg by mouth at bedtime.     fluticasone (FLONASE) 50 MCG/ACT nasal spray Place 1 spray into both nostrils as needed for allergies or rhinitis.     L-Lysine 1000 MG TABS Take 1,000 mg by mouth 2 (two) times daily.      Melatonin 10 MG TABS Take 5 mg by mouth at bedtime.     metoprolol succinate (TOPROL-XL) 50 MG 24 hr tablet Take 1 tablet (50 mg total) by mouth daily. Take with or immediately following a meal. 90 tablet 3   Multiple Vitamin (MULTIVITAMIN) tablet Take 1 tablet by mouth daily.     Omega-3 Fatty Acids (FISH OIL) 1000 MG CAPS Take 2,000 mg by mouth 2 (two) times daily.      Omeprazole-Sodium Bicarbonate (ZEGERID) 20-1100 MG CAPS capsule Take 1 capsule by mouth 2 (two) times daily.      OVER THE COUNTER MEDICATION Take 100 mg by mouth daily. Pregnenolone supplement     Psyllium (FIBER) 0.52 G CAPS Take 4.16 g by mouth at bedtime.      Quercetin 250 MG TABS Take 2 tablets by mouth 2 (two) times daily.     Turmeric 500 MG CAPS Take 500 mg by mouth 2 (two) times daily.       valACYclovir (VALTREX) 500 MG tablet Take 500 mg by mouth 2 (two) times daily.      [START ON 04/22/2021] amphetamine-dextroamphetamine (ADDERALL XR) 20 MG 24 hr capsule Take 1 capsule (20 mg total) by mouth in the morning and at bedtime. 60 capsule 0   [START ON 03/25/2021] amphetamine-dextroamphetamine (ADDERALL XR) 20 MG 24 hr capsule Take 1 capsule (20 mg total) by mouth 2 (two) times daily. 60 capsule 0   amphetamine-dextroamphetamine (ADDERALL XR) 20 MG 24 hr capsule Take 1 capsule (20 mg total) by mouth 2 (two) times daily. 60 capsule 0   escitalopram (LEXAPRO) 5 MG tablet Take 1.5  tablets (7.5 mg total) by mouth daily. 135 tablet 1   QUEtiapine (SEROQUEL) 25 MG tablet Take 3 tablets (75 mg total) by mouth at bedtime. 270 tablet 1   No current facility-administered medications for this visit.    Medication Side Effects: None ? sweating  Allergies:  Allergies  Allergen Reactions   Duloxetine Hcl Other (See Comments)    drowsiness   Ibuprofen Hives   Penicillins Other (See Comments)    Unknown Has patient had a PCN reaction causing immediate rash, facial/tongue/throat swelling, SOB or lightheadedness with hypotension: Unknown Has patient had a PCN reaction causing severe rash involving mucus membranes or skin necrosis: Unknown Has patient had a PCN reaction that required hospitalization: Unknown Has patient had a PCN reaction occurring within the last 10 years: No If all of the above answers are "NO", then may proceed with Cephalosporin use.  Childhood response    Dilaudid [Hydromorphone Hcl] Nausea And Vomiting    Past Medical History:  Diagnosis Date   Adenomatous colon polyp 2001   Allergic rhinitis    ALLERGIC RHINITIS 05/18/2007   Qualifier: Diagnosis of  By: Ronnald Ramp RN, CGRN, Sheri     Allergy    Anxiety    Arthritis    arthritis-hands   Asthma    ASTHMA 05/18/2007   Qualifier: Diagnosis of  By: Ronnald Ramp RN, CGRN, Sheri     Bipolar 1 disorder ([place])    CAD (coronary  artery disease) 10/08/2014   Cancer ([place])    hx skin cancer   Chest tightness    Chronic fatigue syndrome    Chronic headaches    d/t old neck injury   Complication of anesthesia    x1 episode "panic attack" awakening-none in recent years   Coronary artery disease    Depression    DEPRESSION 05/18/2007   Qualifier: Diagnosis of  By: Ronnald Ramp RN, CGRN, Sheri     Diverticulosis    Dysrhythmia    was told benign by cardiologist   GASTROESOPHAGEAL REFLUX DISEASE 05/18/2007   Qualifier: Diagnosis of  By: Ronnald Ramp RN, CGRN, Sheri     GERD (gastroesophageal reflux disease)    HEADACHE, CHRONIC 05/18/2007   Qualifier: Diagnosis of  By: Ronnald Ramp RN, CGRN, Sheri     Heart murmur    benign    History of degenerative disc disease    HOH (hard of hearing)    slight-bilateral   Hypercholesterolemia    Hyperlipidemia 05/18/2007   Qualifier: Diagnosis of  By: Ronnald Ramp RN, CGRN, Sheri     Hypertension    IBS (irritable bowel syndrome)    Internal and external hemorrhoids without complication    Internal hemorrhoids with other complication 03/12/1094   Irritable bowel syndrome 05/18/2007   Qualifier: Diagnosis of  By: Ronnald Ramp RN, CGRN, Sheri     Lyme disease    Personal history of adenomatous colonic polyps 05/18/2007   2001, 2004, 2006, 2009 (last with one small adenoma) Max 4 polyps in past with largest 7 mm (both in 2004)  09/11/2013 7 small polyps adenomas  02/2017 5 adeoimas max 10 mm repeat colon 03/2020     PONV (postoperative nausea and vomiting)    Prostatitis    PROSTATITIS, CHRONIC 05/18/2007   Qualifier: Diagnosis of  By: Ronnald Ramp RN, CGRN, Sheri     Radiculopathy of cervical spine    Sleep apnea    better s/p UPP-no problems now   Stiff neck    Syncope    last episode 1 yr ago-evaluated  by cardiologist-negative findings-?related to low B/p (hydration tends to help).   Testicular hypofunction    Vitamin D deficiency     Family History  Problem Relation Age of Onset   Breast cancer Mother 37   Heart  failure Father 84   Heart disease Other        grandfather/uncle   Alcoholism Other        greatgrandfather/uncle     Colon polyps Other        uncle   CAD Brother    CAD Brother    Colon cancer Neg Hx    Esophageal cancer Neg Hx    Rectal cancer Neg Hx    Stomach cancer Neg Hx     Social History   Socioeconomic History   Marital status: Married    Spouse name: Not on file   Number of children: 2   Years of education: Not on file   Highest education level: Not on file  Occupational History   Occupation: Designer, jewellery: [employer]  Tobacco Use   Smoking status: Former    Packs/day: 0.50    Years: 7.00    Pack years: 3.50    Types: Cigarettes    Quit date: 07/06/1980    Years since quitting: 40.6   Smokeless tobacco: Never  Vaping Use   Vaping Use: Never used  Substance and Sexual Activity   Alcohol use: No   Drug use: No   Sexual activity: Yes  Other Topics Concern   Not on file  Social History Narrative   Not on file   Social Determinants of Health   Financial Resource Strain: Not on file  Food Insecurity: Not on file  Transportation Needs: Not on file  Physical Activity: Not on file  Stress: Not on file  Social Connections: Not on file  Intimate Partner Violence: Not on file    Past Medical History, Surgical history, Social history, and Family history were reviewed and updated as appropriate.   Jake dx bipolar on lithium with tremor.  Answered questions about this.  Please see review of systems for further details on the patient's review from today.   Objective:   Physical Exam:  There were no vitals taken for this visit.  Physical Exam Constitutional:      General: He is not in acute distress.    Appearance: He is well-developed.  Musculoskeletal:        General: No deformity.  Neurological:     Mental Status: He is alert and oriented to person, place, and time.     Motor: No tremor.     Coordination: Coordination normal.      Gait: Gait normal.  Psychiatric:        Attention and Perception: He is inattentive. He does not perceive auditory hallucinations.        Mood and Affect: Mood is anxious. Mood is not depressed. Affect is not labile, blunt, angry, tearful or inappropriate.        Speech: Speech normal.        Behavior: Behavior normal.        Thought Content: Thought content normal. Thought content does not include homicidal or suicidal ideation. Thought content does not include homicidal or suicidal plan.        Cognition and Memory: Cognition normal.        Judgment: Judgment normal.     Comments: Insight intact. No auditory or visual hallucinations.  Irritability controlled  Get's SOB walking from one building to the next.  Lab Review:     Component Value Date/Time   NA 140 09/12/2017 0556   NA 140 09/11/2017 1425   K 4.0 09/12/2017 0556   CL 105 09/12/2017 0556   CO2 29 09/12/2017 0556   GLUCOSE 103 (H) 09/12/2017 0556   BUN 17 09/12/2017 0556   BUN 16 09/11/2017 1425   CREATININE 1.24 09/12/2017 0556   CALCIUM 9.8 09/12/2017 0556   PROT 6.9 09/26/2014 1543   ALBUMIN 3.8 09/26/2014 1543   AST 26 09/26/2014 1543   ALT 28 09/26/2014 1543   ALKPHOS 36 (L) 09/26/2014 1543   BILITOT 0.6 09/26/2014 1543   GFRNONAA >60 09/12/2017 0556   GFRAA >60 09/12/2017 0556       Component Value Date/Time   WBC 8.7 09/12/2017 0556   RBC 4.62 09/12/2017 0556   HGB 14.5 09/12/2017 0556   HGB 15.1 09/11/2017 1425   HCT 43.7 09/12/2017 0556   HCT 43.6 09/11/2017 1425   PLT 209 09/12/2017 0556   PLT 223 09/11/2017 1425   MCV 94.6 09/12/2017 0556   MCV 94 09/11/2017 1425   MCH 31.4 09/12/2017 0556   MCHC 33.2 09/12/2017 0556   RDW 13.0 09/12/2017 0556   RDW 14.1 09/11/2017 1425   LYMPHSABS 2.1 09/26/2014 1543   MONOABS 0.6 09/26/2014 1543   EOSABS 0.5 09/26/2014 1543   BASOSABS 0.0 09/26/2014 1543    No results found for: POCLITH, LITHIUM   No results found for: PHENYTOIN, PHENOBARB,  VALPROATE, CBMZ   .res Assessment: Plan:    Generalized anxiety disorder - Plan: escitalopram (LEXAPRO) 5 MG tablet  PTSD (post-traumatic stress disorder) - Plan: escitalopram (LEXAPRO) 5 MG tablet, QUEtiapine (SEROQUEL) 25 MG tablet  Attention deficit hyperactivity disorder (ADHD), predominantly inattentive type - Plan: amphetamine-dextroamphetamine (ADDERALL XR) 20 MG 24 hr capsule, amphetamine-dextroamphetamine (ADDERALL XR) 20 MG 24 hr capsule, amphetamine-dextroamphetamine (ADDERALL XR) 20 MG 24 hr capsule  Insomnia due to mental condition - Plan: QUEtiapine (SEROQUEL) 25 MG tablet   Greater than 50% of face to face time with patient was spent on counseling and coordination of care. We discussed the change and benefit from the increase in the Lexapro for the anxiety to 7.5 mg daily.  This is the maximum tolerated dosage at this point.  Suspect residual anxiety is still a portion of what he's dealing with, but he's failed multiple meds for anxiety.  He's not sig depressed at this time.  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   Benefit from the Adderall for focus XR 20 mg BID.  Satisfied with current dose   He doesn't want changes in that.  Doubt he would get adequate duration with once daily Adderall XR.  Disc way to figure out if it contributes to sweating.  Sleep benefit from the Seroquel 75 mg is worth it. Plenty of sleep  Disc potential chronic fatigue is related to a post-viral syndrome.  Disc metoprolol can cause tiredness.  Disc concerns with son's recent mental health crisis.  Disc value of vitamin D for mental health and suggest trial of  5000U daily for the winter to see energy is a little better.  No med changes.  FU 6 mos  Lynder Parents, MD, DFAPA     Please see After Visit Summary for patient specific instructions.  No future appointments.  No orders of the defined types were  placed in this encounter.     -------------------------------

## 2021-04-14 ENCOUNTER — Telehealth: Payer: Self-pay | Admitting: Psychiatry

## 2021-04-14 NOTE — Telephone Encounter (Signed)
LVM to rtc and let us know if adderall was in stock at pharmacy before we send

## 2021-04-14 NOTE — Telephone Encounter (Signed)
Pt needs a refill on his adderall xr 20 mg to be sent to the [facility] at [place]

## 2021-04-15 ENCOUNTER — Telehealth: Payer: Self-pay

## 2021-04-15 DIAGNOSIS — F900 Attention-deficit hyperactivity disorder, predominantly inattentive type: Secondary | ICD-10-CM

## 2021-04-15 MED ORDER — AMPHETAMINE-DEXTROAMPHET ER 20 MG PO CP24: 20.0000 mg | ORAL_CAPSULE | Freq: Two times a day (BID) | ORAL | 0 refills | Status: DC

## 2021-04-15 NOTE — Telephone Encounter (Signed)
Pt RTC. He did check with pharmacy before calling. They did have it.

## 2021-04-15 NOTE — Telephone Encounter (Signed)
Pended.

## 2021-04-16 ENCOUNTER — Other Ambulatory Visit: Payer: Self-pay | Admitting: Psychiatry

## 2021-04-16 DIAGNOSIS — F900 Attention-deficit hyperactivity disorder, predominantly inattentive type: Secondary | ICD-10-CM

## 2021-04-16 MED ORDER — AMPHETAMINE-DEXTROAMPHET ER 20 MG PO CP24: 20.0000 mg | ORAL_CAPSULE | Freq: Two times a day (BID) | ORAL | 0 refills | Status: DC

## 2021-04-16 NOTE — Telephone Encounter (Signed)
Ok to call pharmacy and approve?im not sure if you changed the fill date to 2/16 or if epic did

## 2021-04-16 NOTE — Telephone Encounter (Signed)
Sent RX

## 2021-04-16 NOTE — Telephone Encounter (Signed)
Aaron Stout called because he went to pick up his Adderall prescription but was told he couldn't get until 04/22/21.  He said he didn't get the full prescription so he ran out early and is now out and needs to be able to pick up the prescription. He is paying out of pocket for it, so insurance won't matter.  Please approve pick up for today.

## 2021-04-28 ENCOUNTER — Other Ambulatory Visit: Payer: Self-pay | Admitting: Physician Assistant

## 2021-05-06 ENCOUNTER — Other Ambulatory Visit: Payer: Self-pay | Admitting: Internal Medicine

## 2021-05-06 DIAGNOSIS — E041 Nontoxic single thyroid nodule: Secondary | ICD-10-CM

## 2021-05-10 ENCOUNTER — Ambulatory Visit
Admission: RE | Admit: 2021-05-10 | Discharge: 2021-05-10 | Disposition: A | Discharge status: Home / Self Care | Payer: No Typology Code available for payment source | Source: Ambulatory Visit | Attending: Internal Medicine | Admitting: Internal Medicine

## 2021-05-10 DIAGNOSIS — E041 Nontoxic single thyroid nodule: Secondary | ICD-10-CM

## 2021-05-20 ENCOUNTER — Other Ambulatory Visit: Payer: Self-pay | Admitting: Psychiatry

## 2021-05-20 ENCOUNTER — Telehealth: Payer: Self-pay

## 2021-05-20 DIAGNOSIS — F900 Attention-deficit hyperactivity disorder, predominantly inattentive type: Secondary | ICD-10-CM

## 2021-05-20 NOTE — Telephone Encounter (Signed)
Patient lvm at 3:33 today requesting a refill on his generic Adderall. He stated the 20 mg ER is available at the [facility]	[place] location. Patient request a return call to confirm the msg was received. # [id number]. ?

## 2021-05-20 NOTE — Telephone Encounter (Signed)
Pended.

## 2021-05-25 MED ORDER — AMPHETAMINE-DEXTROAMPHET ER 20 MG PO CP24: 20.0000 mg | ORAL_CAPSULE | Freq: Two times a day (BID) | ORAL | 0 refills | Status: DC

## 2021-05-25 NOTE — Telephone Encounter (Signed)
Wife, Threasa Beards, called to check status of refill of his Adderall.  It shows pended 05/20/21 but hasn't been sent.  He has appt 08/26/21.  Needs it sent to [facility] because they have had it.  But it needs to get sent in.  He is now out of the medication and has worked yesterday and today without the medication.  If there is a problem, please let them know.  Also, please let them know when it has been sent. ?

## 2021-05-26 ENCOUNTER — Other Ambulatory Visit: Payer: Self-pay | Admitting: Psychiatry

## 2021-05-26 DIAGNOSIS — F900 Attention-deficit hyperactivity disorder, predominantly inattentive type: Secondary | ICD-10-CM

## 2021-05-26 MED ORDER — AMPHETAMINE-DEXTROAMPHET ER 20 MG PO CP24: 20.0000 mg | ORAL_CAPSULE | Freq: Two times a day (BID) | ORAL | 0 refills | Status: DC

## 2021-05-26 NOTE — Telephone Encounter (Signed)
sent 

## 2021-05-26 NOTE — Telephone Encounter (Signed)
See prior message about Adderall RF. Pt's wife called and LM at 4:15pm today that Aaron Stout meds had not been sent in yet and they requested them on 3/16. Very unhappy it has taken so long. I called [facility] and they acknowledged they received the RX 3/21. I called patient and she stated that they needed it sent in to [facility] in [place] ( not [place]). Please send it to [facility]. ?Aaron Stout had complaints about how the refills are getting handled. ?

## 2021-06-22 ENCOUNTER — Telehealth: Payer: Self-pay | Admitting: Psychiatry

## 2021-06-22 NOTE — Telephone Encounter (Signed)
Next visit is 08/26/21. Aaron Stout's wife Aaron Stout called requesting a refill on his Adderall XR 20 mg. Please call to: ? ?[id number] - [place], [place] ? ?Phone:  [phone]  ?Fax:  [phone]  ? ?Per [facility], they have the Adderall XR 20 mg in stock.  ? ? ? ?

## 2021-06-23 ENCOUNTER — Other Ambulatory Visit: Payer: Self-pay | Admitting: Psychiatry

## 2021-06-23 DIAGNOSIS — F900 Attention-deficit hyperactivity disorder, predominantly inattentive type: Secondary | ICD-10-CM

## 2021-06-23 MED ORDER — AMPHETAMINE-DEXTROAMPHET ER 20 MG PO CP24
20.0000 mg | ORAL_CAPSULE | Freq: Two times a day (BID) | ORAL | 0 refills | Status: DC
Start: 2021-07-21 — End: 2021-08-26

## 2021-06-23 MED ORDER — AMPHETAMINE-DEXTROAMPHET ER 20 MG PO CP24: 20.0000 mg | ORAL_CAPSULE | Freq: Two times a day (BID) | ORAL | 0 refills | Status: DC

## 2021-06-23 NOTE — Telephone Encounter (Signed)
sent 

## 2021-06-23 NOTE — Telephone Encounter (Signed)
Pt called checking on the status of refill. Please send in today ?

## 2021-06-25 ENCOUNTER — Other Ambulatory Visit: Payer: Self-pay

## 2021-06-25 DIAGNOSIS — F900 Attention-deficit hyperactivity disorder, predominantly inattentive type: Secondary | ICD-10-CM

## 2021-06-25 MED ORDER — AMPHETAMINE-DEXTROAMPHET ER 20 MG PO CP24: 20.0000 mg | ORAL_CAPSULE | Freq: Two times a day (BID) | ORAL | 0 refills | Status: DC

## 2021-07-19 ENCOUNTER — Encounter: Payer: Self-pay | Admitting: Cardiology

## 2021-07-19 ENCOUNTER — Ambulatory Visit: Payer: No Typology Code available for payment source | Admitting: Cardiology

## 2021-07-19 VITALS — BP 150/71 | HR 78 | Ht 72.5 in | Wt 250.6 lb

## 2021-07-19 DIAGNOSIS — I712 Thoracic aortic aneurysm, without rupture, unspecified: Secondary | ICD-10-CM | POA: Insufficient documentation

## 2021-07-19 DIAGNOSIS — I209 Angina pectoris, unspecified: Secondary | ICD-10-CM | POA: Diagnosis not present

## 2021-07-19 DIAGNOSIS — I10 Essential (primary) hypertension: Secondary | ICD-10-CM

## 2021-07-19 DIAGNOSIS — E785 Hyperlipidemia, unspecified: Secondary | ICD-10-CM

## 2021-07-19 DIAGNOSIS — I77810 Thoracic aortic ectasia: Secondary | ICD-10-CM | POA: Insufficient documentation

## 2021-07-19 DIAGNOSIS — R931 Abnormal findings on diagnostic imaging of heart and coronary circulation: Secondary | ICD-10-CM

## 2021-07-19 DIAGNOSIS — R002 Palpitations: Secondary | ICD-10-CM

## 2021-07-19 DIAGNOSIS — I2510 Atherosclerotic heart disease of native coronary artery without angina pectoris: Secondary | ICD-10-CM | POA: Diagnosis not present

## 2021-07-19 DIAGNOSIS — I7121 Aneurysm of the ascending aorta, without rupture: Secondary | ICD-10-CM

## 2021-07-19 DIAGNOSIS — R0609 Other forms of dyspnea: Secondary | ICD-10-CM | POA: Insufficient documentation

## 2021-07-19 MED ORDER — AMLODIPINE BESYLATE 10 MG PO TABS: 10.0000 mg | ORAL_TABLET | Freq: Every day | ORAL | 3 refills | Status: DC

## 2021-07-19 NOTE — Assessment & Plan Note (Signed)
Unable to perform CT FFR of the vaginal CT scan because of motion artifact.  He was taken to the Cath Lab and had mild luminal irregularities with 40% stenosis in the LCx the most prominent lesion.  Symptoms thought to be either nonanginal or potentially microvascular in nature. ? ?Was started on beta-blocker and amlodipine, but still did not have lipids managed adequately.  I do not have labs for review. ? ?Presumably, he is on aspirin ?

## 2021-07-19 NOTE — Assessment & Plan Note (Addendum)
Noting exertional dyspnea along with some chest discomfort.  The dyspnea with exertion is more concerning for potential cardiac etiology that the chest pain is.  However there is also concern about pulmonary despite him having recent PFTs that are relatively normal. ? ?I suspect is also component of deconditioning. ? ?Plan: Evaluate with 2D echocardiogram and CPX ? ?Shared Decision Making/Informed Consent ?The risks [chest pain, shortness of breath, cardiac arrhythmias, dizziness, blood pressure fluctuations, myocardial infarction, stroke/transient ischemic attack, and life-threatening complications (estimated to be 1 in 10,000)], benefits (risk stratification, diagnosing coronary artery disease, treatment guidance) and alternatives of an exercise tolerance test were discussed in detail with Aaron Stout and he agrees to proceed. ?

## 2021-07-19 NOTE — Patient Instructions (Addendum)
Medication Instructions:  ? ?Increase to 10 mg Amlodipine daily  ? ?*If you need a refill on your cardiac medications before your next appointment, please call your pharmacy* ? ? ?Lab Work: ?BMP week prior to having Ct angio of Aorta  ? ?If you have labs (blood work) drawn today and your tests are completely normal, you will receive your results only by: ?MyChart Message (if you have MyChart) OR ?A paper copy in the mail ?If you have any lab test that is abnormal or we need to change your treatment, we will call you to review the results. ? ? ?Testing/Procedures: ?Will be schedule at [facility] Angiography ([facility]A) of aorta, is a special type of CT scan that uses a computer to produce multi-dimensional views of major blood vessels throughout the body. In CT angiography, a contrast material is injected through an IV to help visualize the blood vessels  ? ?And ?[facility]  ?Your physician has recommended that you have a cardiopulmonary stress test (CPX). CPX testing is a non-invasive measurement of heart and lung function. It replaces a traditional treadmill stress test. This type of test provides a tremendous amount of information that relates not only to your present condition but also for future outcomes. This test combines measurements of you ventilation, respiratory gas exchange in the lungs, electrocardiogram (EKG), blood pressure and physical response before, during, and following an exercise protocol. ? ?Will be schedule at [employer] [facility]  [address] ?Your physician has requested that you have an echocardiogram. Echocardiography is a painless test that uses sound waves to create images of your heart. It provides your doctor with information about the size and shape of your heart and how well your heart?s chambers and valves are working. This procedure takes approximately one hour. There are no  restrictions for this procedure. ? ? ? ? ?The format for your next appointment:   ?In Person ? ?Provider:   ?Dr Glenetta Hew ? ? ?Other Instructions  ? ? ?

## 2021-07-19 NOTE — Assessment & Plan Note (Signed)
Abnormal Coronary CTA suggesting significant disease, but all seem to be negative remodeling.  Only 40% circumflex disease noted on cath.  ? ?Now having unusual/atypical sounding chest discomfort symptoms.  We have previously considered the possibility of microvascular ischemia.  Although his symptoms do not sound classic. ? ?Plan: ?? In an effort to improve microvascular circulation, will increase amlodipine to 10 mg daily. ?? Continue afterload reduction with Benicar ?? Hold off on beta-blocker reinitiation until reevaluated CPX and echo. => At that point I think reinstitution of beta-blocker is reasonable.  Would depend on what his blood pressure is.  Bystolic is probably better for blood pressure and less likely to cause fatigue. ?? Continue aspirin, co-Q10, fish oil and colestipol. => We will need to most recent lipid panel as he should probably be on statin. ?

## 2021-07-19 NOTE — Assessment & Plan Note (Signed)
He has been given the diagnosis of angina in the past.  Initially symptoms notably improved with the addition of metoprolol succinate 50 mg and amlodipine 5 mg.  However symptoms recurred last fall. ? However he is now again having chest pain which been previously thought to be potentially related to cardiac etiology.  However this symptom is at rest and persistent lasting hours.   ?He also indicates that exertion does not exacerbate the discomfort.  This would argue against micro or macrovascular ischemia. ? ?Cardiac catheter relatively reassuring.  There was concern for possible microvascular disease which was the reason for the addition of calcium channel blocker.  His current symptoms to me do not sound consistent with angina. ? ?Plan: ?? Increase amlodipine to 10 mg daily ?? Evaluate for cardiac or pulmonary etiology of symptoms of dyspnea and chest pain with CPX. ? ?

## 2021-07-19 NOTE — Assessment & Plan Note (Signed)
Blood pressure is high today combination Benicar and amlodipine.  He had been on Bystolic which I think the intention was for this to continue although it is not clear.  He thought the intention was to stop Bystolic and start Benicar, however available notes indicate that may not be the initial plan. ? ?Plan for now and increase his amlodipine to 10 mg daily.  He also would probably benefit from a diuretic and potentially reinitiation of beta-blocker. ?

## 2021-07-19 NOTE — Assessment & Plan Note (Signed)
Hard to really tell what he is describing besides a flip-flopping sensations.  He had multiple different planes today.  We can reassess at follow-up.  This would be potentially indication for restarting beta-blocker. ?

## 2021-07-19 NOTE — Assessment & Plan Note (Signed)
In the past, was unable to tolerate atorvastatin.  Is now on several other medications but not on a statin.  I do not have recent labs since May 2022. ?Anticipate that he will be having them checked again in May 2023.  His LDL last year was 128 with triglycerides 241.  TG 219. ? ?Definitely needs more aggressive therapy.  Would like to see recheck labs in order to determine how aggressive.  If unable to tolerate statins, would likely consider referral for PCSK9 inhibitor. ?

## 2021-07-19 NOTE — Progress Notes (Signed)
? ? ?Primary Care Provider: Ginger Organ., MD ? [facility] ?[facility] Cardiologist: Shirlee More, MD ?Electrophysiologist: None ? ?Clinic Note: ?No chief complaint on file. ? ?=================================== ? ?ASSESSMENT/PLAN  ? ?Problem List Items Addressed This Visit   ? ?  ? Cardiology Problems  ? Angina pectoris ([place])  ?  He has been given the diagnosis of angina in the past.  Initially symptoms notably improved with the addition of metoprolol succinate 50 mg and amlodipine 5 mg.  However symptoms recurred last fall. ? However he is now again having chest pain which been previously thought to be potentially related to cardiac etiology.  However this symptom is at rest and persistent lasting hours.   ?He also indicates that exertion does not exacerbate the discomfort.  This would argue against micro or macrovascular ischemia. ? ?Cardiac catheter relatively reassuring.  There was concern for possible microvascular disease which was the reason for the addition of calcium channel blocker.  His current symptoms to me do not sound consistent with angina. ? ?Plan: ?Increase amlodipine to 10 mg daily ?Evaluate for cardiac or pulmonary etiology of symptoms of dyspnea and chest pain with CPX. ? ?  ?  ? Relevant Medications  ? olmesartan (BENICAR) 20 MG tablet  ? amLODipine (NORVASC) 10 MG tablet  ? Other Relevant Orders  ? EKG 12-Lead  ? Basic metabolic panel  ? Cardiopulmonary exercise test  ? ECHOCARDIOGRAM COMPLETE  ? CT ANGIO CHEST AORTA W/CM & OR WO/CM  ? Cardiac Stress Test: Informed Consent Details: Physician/Practitioner Attestation; Transcribe to consent form and obtain patient signature  ? Coronary artery disease, non-occlusive - Primary (Chronic)  ?  Abnormal Coronary CTA suggesting significant disease, but all seem to be negative remodeling.  Only 40% circumflex disease noted on cath.  ? ?Now having unusual/atypical sounding chest discomfort symptoms.  We have previously  considered the possibility of microvascular ischemia.  Although his symptoms do not sound classic. ? ?Plan: ?In an effort to improve microvascular circulation, will increase amlodipine to 10 mg daily. ?Continue afterload reduction with Benicar ?Hold off on beta-blocker reinitiation until reevaluated CPX and echo. => At that point I think reinstitution of beta-blocker is reasonable.  Would depend on what his blood pressure is.  Bystolic is probably better for blood pressure and less likely to cause fatigue. ?Continue aspirin, co-Q10, fish oil and colestipol. => We will need to most recent lipid panel as he should probably be on statin. ?  ?  ? Relevant Medications  ? olmesartan (BENICAR) 20 MG tablet  ? amLODipine (NORVASC) 10 MG tablet  ? Other Relevant Orders  ? EKG 12-Lead  ? Basic metabolic panel  ? Cardiopulmonary exercise test  ? ECHOCARDIOGRAM COMPLETE  ? CT ANGIO CHEST AORTA W/CM & OR WO/CM  ? Cardiac Stress Test: Informed Consent Details: Physician/Practitioner Attestation; Transcribe to consent form and obtain patient signature  ? Hyperlipidemia LDL goal <100 (Chronic)  ?  In the past, was unable to tolerate atorvastatin.  Is now on several other medications but not on a statin.  I do not have recent labs since May 2022. ?Anticipate that he will be having them checked again in May 2023.  His LDL last year was 128 with triglycerides 241.  TG 219. ? ?Definitely needs more aggressive therapy.  Would like to see recheck labs in order to determine how aggressive.  If unable to tolerate statins, would likely consider referral for PCSK9 inhibitor. ? ?  ?  ?  Relevant Medications  ? olmesartan (BENICAR) 20 MG tablet  ? amLODipine (NORVASC) 10 MG tablet  ? Other Relevant Orders  ? Cardiac Stress Test: Informed Consent Details: Physician/Practitioner Attestation; Transcribe to consent form and obtain patient signature  ? Essential hypertension (Chronic)  ?  Blood pressure is high today combination Benicar and  amlodipine.  He had been on Bystolic which I think the intention was for this to continue although it is not clear.  He thought the intention was to stop Bystolic and start Benicar, however available notes indicate that may not be the initial plan. ? ?Plan for now and increase his amlodipine to 10 mg daily.  He also would probably benefit from a diuretic and potentially reinitiation of beta-blocker. ? ?  ?  ? Relevant Medications  ? olmesartan (BENICAR) 20 MG tablet  ? amLODipine (NORVASC) 10 MG tablet  ? Other Relevant Orders  ? EKG 12-Lead  ? Basic metabolic panel  ? Cardiopulmonary exercise test  ? ECHOCARDIOGRAM COMPLETE  ? CT ANGIO CHEST AORTA W/CM & OR WO/CM  ? Cardiac Stress Test: Informed Consent Details: Physician/Practitioner Attestation; Transcribe to consent form and obtain patient signature  ? Thoracic aortic aneurysm (HCC) (Chronic)  ?  Coronary CTA in 2019 suggested thoracic aorta 3.9 cm.  Follow-up CTA chest at [facility] imaging in May 2022 indicated that the thoracic aorta was 4.3 cm. ? ?Would be due for annual follow-up: ?[place]. ?We will need pre-CT labs ?  ?  ? Relevant Medications  ? olmesartan (BENICAR) 20 MG tablet  ? amLODipine (NORVASC) 10 MG tablet  ? Other Relevant Orders  ? EKG 12-Lead  ? Basic metabolic panel  ? Cardiopulmonary exercise test  ? ECHOCARDIOGRAM COMPLETE  ? CT ANGIO CHEST AORTA W/CM & OR WO/CM  ? Cardiac Stress Test: Informed Consent Details: Physician/Practitioner Attestation; Transcribe to consent form and obtain patient signature  ?  ? Other  ? Abnormal cardiac CT angiography (Chronic)  ?  Unable to perform CT FFR of the vaginal CT scan because of motion artifact.  He was taken to the Cath Lab and had mild luminal irregularities with 40% stenosis in the LCx the most prominent lesion.  Symptoms thought to be either nonanginal or potentially microvascular in nature. ? ?Was started on beta-blocker and amlodipine, but still did not have lipids managed  adequately.  I do not have labs for review. ? ?Presumably, he is on aspirin ? ?  ?  ? Palpitation (Chronic)  ?  Hard to really tell what he is describing besides a flip-flopping sensations.  He had multiple different planes today.  We can reassess at follow-up.  This would be potentially indication for restarting beta-blocker. ? ?  ?  ? Relevant Orders  ? EKG 12-Lead  ? Basic metabolic panel  ? Cardiopulmonary exercise test  ? ECHOCARDIOGRAM COMPLETE  ? CT ANGIO CHEST AORTA W/CM & OR WO/CM  ? Cardiac Stress Test: Informed Consent Details: Physician/Practitioner Attestation; Transcribe to consent form and obtain patient signature  ? DOE (dyspnea on exertion) (Chronic)  ?  Noting exertional dyspnea along with some chest discomfort.  The dyspnea with exertion is more concerning for potential cardiac etiology that the chest pain is.  However there is also concern about pulmonary despite him having recent PFTs that are relatively normal. ? ?I suspect is also component of deconditioning. ? ?Plan: Evaluate with 2D echocardiogram and CPX ? ?Shared Decision Making/Informed Consent ?The risks [chest pain, shortness of breath, cardiac arrhythmias, dizziness,  blood pressure fluctuations, myocardial infarction, stroke/transient ischemic attack, and life-threatening complications (estimated to be 1 in 10,000)], benefits (risk stratification, diagnosing coronary artery disease, treatment guidance) and alternatives of an exercise tolerance test were discussed in detail with Mr. Fangman and he agrees to proceed. ?  ?  ? Relevant Orders  ? EKG 12-Lead  ? Basic metabolic panel  ? Cardiopulmonary exercise test  ? ECHOCARDIOGRAM COMPLETE  ? CT ANGIO CHEST AORTA W/CM & OR WO/CM  ? Cardiac Stress Test: Informed Consent Details: Physician/Practitioner Attestation; Transcribe to consent form and obtain patient signature  ? ? ?=================================== ? ?HPI:   ? 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 who is being seen today for the evaluation of Coronary Artery Disease-with Angina, HTN,

## 2021-07-19 NOTE — Assessment & Plan Note (Signed)
Coronary CTA in 2019 suggested thoracic aorta 3.9 cm.  Follow-up CTA chest at [facility] imaging in May 2022 indicated that the thoracic aorta was 4.3 cm. ? ?Would be due for annual follow-up: ?? Recheck CTA Chest-Aorta. ?? We will need pre-CT labs ?

## 2021-07-20 ENCOUNTER — Encounter: Payer: Self-pay | Admitting: Cardiology

## 2021-07-23 ENCOUNTER — Encounter: Payer: Self-pay | Admitting: Cardiology

## 2021-07-27 ENCOUNTER — Ambulatory Visit
Admission: RE | Admit: 2021-07-27 | Discharge: 2021-07-27 | Disposition: A | Discharge status: Home / Self Care | Payer: No Typology Code available for payment source | Source: Ambulatory Visit | Attending: Cardiology | Admitting: Cardiology

## 2021-07-27 DIAGNOSIS — I10 Essential (primary) hypertension: Secondary | ICD-10-CM

## 2021-07-27 DIAGNOSIS — R0609 Other forms of dyspnea: Secondary | ICD-10-CM

## 2021-07-27 DIAGNOSIS — I7121 Aneurysm of the ascending aorta, without rupture: Secondary | ICD-10-CM

## 2021-07-27 DIAGNOSIS — R002 Palpitations: Secondary | ICD-10-CM

## 2021-07-27 DIAGNOSIS — I2510 Atherosclerotic heart disease of native coronary artery without angina pectoris: Secondary | ICD-10-CM

## 2021-07-27 DIAGNOSIS — I209 Angina pectoris, unspecified: Secondary | ICD-10-CM

## 2021-07-27 MED ORDER — IOPAMIDOL (ISOVUE-370) INJECTION 76%
75.0000 mL | Freq: Once | INTRAVENOUS | Status: AC | PRN
Start: 2021-07-27 — End: 2021-07-27
  Administered 2021-07-27: 75 mL via INTRAVENOUS

## 2021-07-27 NOTE — Telephone Encounter (Signed)
Patient contacted - appointment scheduleed 07/27/21- patient was in the office for labwork

## 2021-07-28 ENCOUNTER — Ambulatory Visit (HOSPITAL_BASED_OUTPATIENT_CLINIC_OR_DEPARTMENT_OTHER)
Admission: RE | Admit: 2021-07-28 | Discharge: 2021-07-28 | Disposition: A | Discharge status: Home / Self Care | Payer: No Typology Code available for payment source | Source: Ambulatory Visit | Attending: Cardiology | Admitting: Cardiology

## 2021-07-28 DIAGNOSIS — I7121 Aneurysm of the ascending aorta, without rupture: Secondary | ICD-10-CM | POA: Insufficient documentation

## 2021-07-28 DIAGNOSIS — R079 Chest pain, unspecified: Secondary | ICD-10-CM | POA: Diagnosis not present

## 2021-07-28 DIAGNOSIS — I209 Angina pectoris, unspecified: Secondary | ICD-10-CM | POA: Diagnosis present

## 2021-07-28 DIAGNOSIS — R0609 Other forms of dyspnea: Secondary | ICD-10-CM | POA: Insufficient documentation

## 2021-07-28 DIAGNOSIS — I10 Essential (primary) hypertension: Secondary | ICD-10-CM | POA: Diagnosis present

## 2021-07-28 DIAGNOSIS — R002 Palpitations: Secondary | ICD-10-CM | POA: Insufficient documentation

## 2021-07-28 DIAGNOSIS — I2510 Atherosclerotic heart disease of native coronary artery without angina pectoris: Secondary | ICD-10-CM | POA: Diagnosis present

## 2021-07-28 HISTORY — PX: TRANSTHORACIC ECHOCARDIOGRAM: SHX275

## 2021-07-28 LAB — ECHOCARDIOGRAM COMPLETE
AR max vel: 1.73 cm2
AV Area VTI: 1.97 cm2
AV Area mean vel: 1.65 cm2
AV Mean grad: 10 mmHg
AV Peak grad: 17.8 mmHg
Ao pk vel: 2.11 m/s
Area-P 1/2: 3.15 cm2
S' Lateral: 3.4 cm

## 2021-07-28 LAB — BASIC METABOLIC PANEL
BUN/Creatinine Ratio: 14 (ref 10–24)
BUN: 15 mg/dL (ref 8–27)
CO2: 23 mmol/L (ref 20–29)
Calcium: 10 mg/dL (ref 8.6–10.2)
Chloride: 102 mmol/L (ref 96–106)
Creatinine, Ser: 1.05 mg/dL (ref 0.76–1.27)
Glucose: 92 mg/dL (ref 70–99)
Potassium: 4.3 mmol/L (ref 3.5–5.2)
Sodium: 139 mmol/L (ref 134–144)
eGFR: 78 mL/min/{1.73_m2} (ref >59)

## 2021-07-28 NOTE — Progress Notes (Signed)
  Echocardiogram 2D Echocardiogram has been performed.  Aaron Stout F 07/28/2021, 2:01 PM

## 2021-07-29 ENCOUNTER — Ambulatory Visit (HOSPITAL_COMMUNITY): Payer: No Typology Code available for payment source | Attending: Cardiology

## 2021-07-29 DIAGNOSIS — R0600 Dyspnea, unspecified: Secondary | ICD-10-CM | POA: Diagnosis not present

## 2021-07-29 DIAGNOSIS — I7121 Aneurysm of the ascending aorta, without rupture: Secondary | ICD-10-CM

## 2021-07-29 DIAGNOSIS — I10 Essential (primary) hypertension: Secondary | ICD-10-CM

## 2021-07-29 DIAGNOSIS — R0609 Other forms of dyspnea: Secondary | ICD-10-CM

## 2021-07-29 DIAGNOSIS — R002 Palpitations: Secondary | ICD-10-CM

## 2021-07-29 DIAGNOSIS — I2510 Atherosclerotic heart disease of native coronary artery without angina pectoris: Secondary | ICD-10-CM

## 2021-07-29 DIAGNOSIS — I209 Angina pectoris, unspecified: Secondary | ICD-10-CM

## 2021-07-29 HISTORY — PX: OTHER SURGICAL HISTORY: SHX169

## 2021-08-09 ENCOUNTER — Other Ambulatory Visit: Payer: Self-pay | Admitting: Internal Medicine

## 2021-08-09 DIAGNOSIS — R911 Solitary pulmonary nodule: Secondary | ICD-10-CM

## 2021-08-25 ENCOUNTER — Other Ambulatory Visit: Payer: Self-pay | Admitting: Internal Medicine

## 2021-08-25 ENCOUNTER — Other Ambulatory Visit (HOSPITAL_COMMUNITY): Payer: Self-pay | Admitting: Internal Medicine

## 2021-08-25 DIAGNOSIS — I10 Essential (primary) hypertension: Secondary | ICD-10-CM

## 2021-08-25 DIAGNOSIS — Z87891 Personal history of nicotine dependence: Secondary | ICD-10-CM

## 2021-08-25 DIAGNOSIS — F17200 Nicotine dependence, unspecified, uncomplicated: Secondary | ICD-10-CM

## 2021-08-26 ENCOUNTER — Ambulatory Visit (INDEPENDENT_AMBULATORY_CARE_PROVIDER_SITE_OTHER): Payer: No Typology Code available for payment source | Admitting: Psychiatry

## 2021-08-26 ENCOUNTER — Encounter: Payer: Self-pay | Admitting: Psychiatry

## 2021-08-26 ENCOUNTER — Other Ambulatory Visit (HOSPITAL_COMMUNITY): Payer: No Typology Code available for payment source

## 2021-08-26 ENCOUNTER — Encounter (HOSPITAL_COMMUNITY): Payer: Self-pay

## 2021-08-26 DIAGNOSIS — F900 Attention-deficit hyperactivity disorder, predominantly inattentive type: Secondary | ICD-10-CM

## 2021-08-26 DIAGNOSIS — F5105 Insomnia due to other mental disorder: Secondary | ICD-10-CM

## 2021-08-26 DIAGNOSIS — F4310 Post-traumatic stress disorder, unspecified: Secondary | ICD-10-CM | POA: Diagnosis not present

## 2021-08-26 DIAGNOSIS — F411 Generalized anxiety disorder: Secondary | ICD-10-CM | POA: Diagnosis not present

## 2021-08-26 MED ORDER — AMPHETAMINE-DEXTROAMPHET ER 20 MG PO CP24: 20.0000 mg | ORAL_CAPSULE | Freq: Two times a day (BID) | ORAL | 0 refills | Status: DC

## 2021-08-26 MED ORDER — QUETIAPINE FUMARATE 25 MG PO TABS: 75.0000 mg | ORAL_TABLET | Freq: Every day | ORAL | 1 refills | Status: DC

## 2021-08-26 MED ORDER — ESCITALOPRAM OXALATE 5 MG PO TABS: 7.5000 mg | ORAL_TABLET | Freq: Every day | ORAL | 1 refills | Status: DC

## 2021-08-26 NOTE — Progress Notes (Signed)
[name] [id number] Apr 17, 1955 66 y.o.  Subjective:   Patient ID:  Aaron Stout is a 66 y.o. (DOB 18-Oct-1955) male.  Chief Complaint:  Chief Complaint  Patient presents with   Follow-up   ADD   Anxiety   Sleeping Problem    Depression        Associated symptoms include fatigue.  Associated symptoms include no decreased concentration and no suicidal ideas.  [name] presents to the office today for follow-up of mood and anxiety and sleep.  At visit in 2020had residual anxiety and it was suggested he disc with cardiologist the use of a beta blocker metoprolol and it helped BP and  anxiety DT multiple failed anxiety meds.  Has been generally good.    At  visit 10/27/19  Lexapro was increased from 5 to 7.'5mg'$  dailty for anxiety which is chronic. Thinks the anxiety is a helpful.  I  Chronic anxiety and stress at work.  Evaluations pending at work.  Less obsessing about that.  Has tried '10mg'$  Lexapro and he feels it was too much.  visit October 2020 without med changes.    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   Still CO sweating easily. Pretty good overall.  No major changes since here.  Same old chronic tiredness and easily OOB.  Still a little anxiety hangs on.  Not horrible and able to function and keep on going.    12/19/2019 appointment with the following noted: Fine overall.  Generally steady with mood and other factors. Chronic tiredness without change.  Trying to manage stress as much as possible. Stress Hudson back in the house and some lability continues with anger outbursts.  He always finds someone at work he doesn't like and starts ruminating on it. SE mild hangover. Mild hangover with Seroquel which helps the sleep. Makes him sleepy in 60-90 mins. Chronic sweating and SOB. Plan: No med changes  06/18/2020 appointment with following noted: No Covid.   Knee surgery 12/21. Otherwise health  is good.    02/25/21 appt noted: Taking lexapro 7.5 mg , Adderall and quetiapine 75 mg HS. No SE.  Sleep pretty good. Tiredness worse in the day.  08/26/21  appt noted:  HT at [facility] ph has the Adderall. Benfit Adderall No SE  Overll doing ok with mood and anxity. Pt reports that mood is occ irritable and Anxious and describes anxiety as mild-moderate like if too many things coming at him at once.   Anxiety symptoms include: Excessive Worry,. Pt reports no sleep issues. Pt reports that appetite is good. Pt reports that energy is good and good. Concentration is good. 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  He's aware and tries to catch it.    Past Psychiatric Medication Trials: Lexapro 10 SE, Zoloft,  Emsam,  Welllbutrin Adderall, Ritalin , Concerta, Vyvanse, seroquel 75 for sleep, perphenazine, Depakote,  Vraylar 1.5, lithium 600, Trileptal, Buspar dizzy, lamotrigine,  Belsomra,   Review of Systems:  Review of Systems  Constitutional:  Positive for fatigue.       Long history of sweating  Respiratory:  Positive for shortness of breath.   Cardiovascular:  Negative for palpitations.  Musculoskeletal:  Positive for joint swelling.  Neurological:  Negative for dizziness, tremors and weakness.  Psychiatric/Behavioral:  Negative for agitation, behavioral problems,  confusion, decreased concentration, dysphoric mood, hallucinations, self-injury, sleep disturbance and suicidal ideas. The patient is nervous/anxious. The patient is not hyperactive.     Medications: I have reviewed the patient's current medications.  Current Outpatient Medications  Medication Sig Dispense Refill   albuterol (PROVENTIL HFA;VENTOLIN HFA) 108 (90 Base) MCG/ACT inhaler Inhale 2 puffs into the lungs 2 (two) times daily. (Patient taking differently: Inhale 2 puffs into the lungs as needed.) 1 Inhaler 3    amLODipine (NORVASC) 10 MG tablet Take 1 tablet (10 mg total) by mouth daily. 180 tablet 3   Ascorbic Acid (VITAMIN C) 1000 MG tablet Take 1,000 mg by mouth 2 (two) times daily.     azelastine (ASTELIN) 0.1 % nasal spray Place 1 spray into both nostrils as needed for rhinitis. Use in each nostril as directed     Cholecalciferol (VITAMIN D) 2000 UNITS tablet Take 3,000 Units by mouth daily.      clotrimazole-betamethasone (LOTRISONE) cream Apply 1 application topically 2 (two) times daily as needed (for psoriasis).     Coenzyme Q10 (CO Q-10) 100 MG CAPS Take 100 mg by mouth daily.     colestipol (COLESTID) 5 g packet TAKE ONE AND A HALF PACKETS DAILY 131 packet 1   Cyanocobalamin (VITAMIN B 12 PO) Take 5,000 mcg by mouth at bedtime.      DHEA 25 MG CAPS Take 25 mg by mouth daily.      fexofenadine (ALLEGRA) 180 MG tablet Take 180 mg by mouth at bedtime.     fluticasone (FLONASE) 50 MCG/ACT nasal spray Place 1 spray into both nostrils as needed for allergies or rhinitis.     montelukast (SINGULAIR) 10 MG tablet Take 10 mg by mouth daily.     Multiple Vitamin (MULTIVITAMIN) tablet Take 1 tablet by mouth daily.     olmesartan (BENICAR) 20 MG tablet Take 20 mg by mouth daily.     Omega-3 Fatty Acids (FISH OIL) 1000 MG CAPS Take 2,000 mg by mouth 2 (two) times daily.      Omeprazole-Sodium Bicarbonate (ZEGERID) 20-1100 MG CAPS capsule Take 1 capsule by mouth 2 (two) times daily.      OVER THE COUNTER MEDICATION Take 100 mg by mouth daily. Pregnenolone supplement     Psyllium (FIBER) 0.52 G CAPS Take 4.16 g by mouth at bedtime.      Quercetin 250 MG TABS Take 2 tablets by mouth 2 (two) times daily.     Turmeric 500 MG CAPS Take 500 mg by mouth 2 (two) times daily.      valACYclovir (VALTREX) 500 MG tablet Take 500 mg by mouth 2 (two) times daily.      WIXELA INHUB 100-50 MCG/ACT AEPB Inhale 1 puff into the lungs 2 (two) times daily.     [START ON 10/21/2021] amphetamine-dextroamphetamine (ADDERALL XR)  20 MG 24 hr capsule Take 1 capsule (20 mg total) by mouth 2 (two) times daily. 60 capsule 0   [START ON 09/23/2021] amphetamine-dextroamphetamine (ADDERALL XR) 20 MG 24 hr capsule Take 1 capsule (20 mg total) by mouth 2 (two) times daily. 60 capsule 0   amphetamine-dextroamphetamine (ADDERALL XR) 20 MG 24 hr capsule Take 1 capsule (20 mg total) by mouth 2 (two) times daily. 60 capsule 0   escitalopram (LEXAPRO) 5 MG tablet Take 1.5 tablets (7.5 mg total) by mouth daily. 135 tablet 1   QUEtiapine (SEROQUEL) 25 MG tablet Take 3 tablets (75 mg total) by mouth at bedtime. 270 tablet 1   No  current facility-administered medications for this visit.    Medication Side Effects: None ? sweating  Allergies:  Allergies  Allergen Reactions   Duloxetine Hcl Other (See Comments)    drowsiness   Ibuprofen Hives   Voltaren [Diclofenac] Shortness Of Breath    Also had palpitations   Penicillins Other (See Comments)    Unknown Has patient had a PCN reaction causing immediate rash, facial/tongue/throat swelling, SOB or lightheadedness with hypotension: Unknown Has patient had a PCN reaction causing severe rash involving mucus membranes or skin necrosis: Unknown Has patient had a PCN reaction that required hospitalization: Unknown Has patient had a PCN reaction occurring within the last 10 years: No If all of the above answers are "NO", then may proceed with Cephalosporin use.  Childhood response    Dilaudid [Hydromorphone Hcl] Nausea And Vomiting   Simvastatin Other (See Comments)    Cramps    Past Medical History:  Diagnosis Date   Adenomatous colon polyp 2001   ALLERGIC RHINITIS 05/18/2007   Qualifier: Diagnosis of  By: Ronnald Ramp RN, [place], [place]     Anxiety    Arthritis 2013   arthritis-hands; Per PCPs notes-inflammatory arthritis   ASTHMA 05/18/2007   Qualifier: Diagnosis of  By: Ronnald Ramp RN, CGRN, Sheri     Bipolar 1 disorder ([place])    With depression   Cancer ([place])    hx skin cancer   Chest  tightness    Chronic fatigue syndrome    Chronic headaches    d/t old neck injury   Complication of anesthesia    x1 episode "panic attack" awakening-none in recent years   Coronary artery disease    Diverticulosis    Ectatic thoracic aorta ([place]) 07/2018   CT chest-please warm thoracic ascending aorta ectasia measuring 4.3 cm.   GASTROESOPHAGEAL REFLUX DISEASE 05/18/2007   Qualifier: Diagnosis of  By: Ronnald Ramp RN, CGRN, Sheri     HEADACHE, CHRONIC 05/18/2007   Qualifier: Diagnosis of  By: Ronnald Ramp RN, CGRN, Sheri     Heart murmur    benign    History of degenerative disc disease    HOH (hard of hearing)    slight-bilateral   HSV-2 (herpes simplex virus 2) infection    Per PCP-genital   Hyperlipidemia 05/18/2007   Qualifier: Diagnosis of  By: Ronnald Ramp RN, CGRN, Sheri     Hyperlipidemia due to dietary fat intake    Hypertension    IBS (irritable bowel syndrome)    Incidental lung nodule, > 71m and < 829m2019   Stable is a 2022   Internal hemorrhoids with other complication [id number] Irritable bowel syndrome 05/18/2007   Qualifier: Diagnosis of  By: JoRonnald RampN, CGRN, Sheri     Lyme disease    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  40% proximal to mid LCx.   OSA (obstructive sleep apnea)    better s/p UPP-no problems now   Personal history of adenomatous colonic polyps 05/18/2007   2001, 2004, 2006, 2009 (last with one small adenoma) Max 4 polyps in past with largest 7 mm (both in 2004)  09/11/2013 7 small polyps adenomas  02/2017 5 adeoimas max 10 mm repeat colon 03/2020     PONV (postoperative nausea and vomiting)    Prostatitis    PROSTATITIS, CHRONIC 05/18/2007   Qualifier: Diagnosis of  By: JoRonnald RampN, CGRN, Sheri     Radiculopathy of cervical spine  Syncope 05/2008   1 episode-evaluated by cardiologist-negative findings-?related to low B/p (hydration tends to help).    Testicular hypofunction    Vitamin D deficiency     Family History  Problem Relation Age of Onset   Breast cancer Mother 32   Heart failure Father 18   Bowel Disease Father 51       Bowel obstruction   CAD Brother 11       PCI   Heart attack Brother 11   CAD Brother 1       PCI   Hepatitis B Brother    Heart attack Maternal Grandfather    Congestive Heart Failure Maternal Grandfather    COPD Maternal Grandfather    Heart disease Other        grandfather/uncle   Alcoholism Other        greatgrandfather/uncle     Colon polyps Other        uncle   Bipolar disorder Son    Ehlers-Danlos syndrome Son    Migraines Son    Colon cancer Neg Hx    Esophageal cancer Neg Hx    Rectal cancer Neg Hx    Stomach cancer Neg Hx     Social History   Socioeconomic History   Marital status: Married    Spouse name: Not on file   Number of children: 2   Years of education: Not on file   Highest education level: Master's degree (e.g., MA, MS, MEng, MEd, MSW, MBA)  Occupational History   Occupation: Designer, 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   Aaron Stout dx bipolar on lithium with tremor.  Answered questions about this.  Please see review of systems for further details on the patient's review from today.   Objective:   Physical Exam:  There were no vitals taken for this visit.  Physical Exam Constitutional:      General: He is not in acute distress.    Appearance: He is well-developed.  Musculoskeletal:        General: No deformity.  Neurological:     Mental Status: He  is alert and oriented to person, place, and time.     Motor: No tremor.     Coordination: Coordination normal.     Gait: Gait normal.  Psychiatric:        Attention and Perception: He is inattentive. He does not perceive auditory hallucinations.        Mood and Affect: Mood is anxious. Mood is not depressed. Affect is not labile, blunt, angry, tearful or inappropriate.        Speech: Speech normal.        Behavior: Behavior normal.        Thought Content: Thought content normal. Thought content does not include homicidal or suicidal ideation. Thought content does not include suicidal plan.        Cognition and Memory: Cognition normal.        Judgment: Judgment normal.     Comments: Insight intact. No auditory or visual hallucinations.  Irritability controlled    Get's SOB walking from one building to the next.  Lab Review:     Component Value Date/Time   NA 139 07/27/2021 1420   K 4.3 07/27/2021 1420   CL 102 07/27/2021 1420   CO2 23 07/27/2021 1420   GLUCOSE 92 07/27/2021 1420   GLUCOSE 103 (H) 09/12/2017 0556   BUN 15 07/27/2021 1420   CREATININE 1.05 07/27/2021 1420   CALCIUM 10.0 07/27/2021 1420   PROT 6.9 09/26/2014 1543   ALBUMIN 3.8 09/26/2014 1543   AST 26 09/26/2014 1543    ALT 28 09/26/2014 1543   ALKPHOS 36 (L) 09/26/2014 1543   BILITOT 0.6 09/26/2014 1543   GFRNONAA >60 09/12/2017 0556   GFRAA >60 09/12/2017 0556       Component Value Date/Time   WBC 8.7 09/12/2017 0556   RBC 4.62 09/12/2017 0556   HGB 14.5 09/12/2017 0556   HGB 15.1 09/11/2017 1425   HCT 43.7 09/12/2017 0556   HCT 43.6 09/11/2017 1425   PLT 209 09/12/2017 0556   PLT 223 09/11/2017 1425   MCV 94.6 09/12/2017 0556   MCV 94 09/11/2017 1425   MCH 31.4 09/12/2017 0556   MCHC 33.2 09/12/2017 0556   RDW 13.0 09/12/2017 0556   RDW 14.1 09/11/2017 1425   LYMPHSABS 2.1 09/26/2014 1543   MONOABS 0.6 09/26/2014 1543   EOSABS 0.5 09/26/2014 1543   BASOSABS 0.0 09/26/2014 1543    No results found for: "POCLITH", "LITHIUM"   No results found for: "PHENYTOIN", "PHENOBARB", "VALPROATE", "CBMZ"   .res Assessment: Plan:    Generalized anxiety disorder - Plan: escitalopram (LEXAPRO) 5 MG tablet  Attention deficit hyperactivity disorder (ADHD), predominantly inattentive type - Plan: amphetamine-dextroamphetamine (ADDERALL XR) 20 MG 24 hr capsule, amphetamine-dextroamphetamine (ADDERALL XR) 20 MG 24 hr capsule, amphetamine-dextroamphetamine (ADDERALL XR) 20 MG 24 hr capsule  PTSD (post-traumatic stress disorder) - Plan: escitalopram (LEXAPRO) 5 MG tablet, QUEtiapine (SEROQUEL) 25 MG tablet  Insomnia due to mental condition - Plan: QUEtiapine (SEROQUEL) 25 MG tablet   Greater than 50% of face to face time with patient was spent on counseling and coordination of care. We discussed the change and benefit from the increase in the Lexapro for the anxiety to 7.5 mg daily.  This is the maximum tolerated dosage at this point.  Suspect residual anxiety is still a portion of what he's dealing with, but he's failed multiple meds for anxiety.  He's not sig depressed at this time.  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    Benefit from the Adderall for focus XR 20 mg BID.  Satisfied with current dose   He doesn't want changes in that.  Doubt he would get adequate duration with once daily Adderall XR.  Disc way to figure out if it contributes to sweating.  Sleep benefit from the Seroquel 75 mg is worth it. Plenty of sleep  Disc potential chronic fatigue is related to a post-viral syndrome.  Disc metoprolol can cause tiredness.  Disc concerns with son's recent mental health crisis.  Disc value of vitamin D for mental health and suggest trial of  5000U daily for the winter to see energy is a little better.  No med changes.  FU 6 mos  Lynder Parents, MD, DFAPA     Please see After Visit Summary for patient specific instructions.  Future Appointments  Date Time Provider [place]  10/01/2021  9:30 AM Leonie Man, MD [facility]    No orders of the defined types were placed in this encounter.     -------------------------------

## 2021-09-03 ENCOUNTER — Ambulatory Visit
Admission: RE | Admit: 2021-09-03 | Discharge: 2021-09-03 | Disposition: A | Discharge status: Home / Self Care | Payer: No Typology Code available for payment source | Source: Ambulatory Visit | Attending: Internal Medicine | Admitting: Internal Medicine

## 2021-09-03 DIAGNOSIS — I10 Essential (primary) hypertension: Secondary | ICD-10-CM

## 2021-09-03 DIAGNOSIS — Z87891 Personal history of nicotine dependence: Secondary | ICD-10-CM

## 2021-09-06 ENCOUNTER — Ambulatory Visit: Payer: No Typology Code available for payment source | Admitting: Cardiology

## 2021-09-20 ENCOUNTER — Other Ambulatory Visit: Payer: Self-pay | Admitting: Physician Assistant

## 2021-09-21 ENCOUNTER — Other Ambulatory Visit: Payer: Self-pay | Admitting: Physician Assistant

## 2021-10-01 ENCOUNTER — Encounter: Payer: Self-pay | Admitting: Cardiology

## 2021-10-01 ENCOUNTER — Ambulatory Visit: Payer: No Typology Code available for payment source | Admitting: Cardiology

## 2021-10-01 VITALS — BP 132/66 | HR 74 | Ht 73.0 in | Wt 242.0 lb

## 2021-10-01 DIAGNOSIS — I10 Essential (primary) hypertension: Secondary | ICD-10-CM | POA: Diagnosis not present

## 2021-10-01 DIAGNOSIS — I209 Angina pectoris, unspecified: Secondary | ICD-10-CM

## 2021-10-01 DIAGNOSIS — I2510 Atherosclerotic heart disease of native coronary artery without angina pectoris: Secondary | ICD-10-CM | POA: Diagnosis not present

## 2021-10-01 DIAGNOSIS — I7121 Aneurysm of the ascending aorta, without rupture: Secondary | ICD-10-CM | POA: Diagnosis not present

## 2021-10-01 DIAGNOSIS — E785 Hyperlipidemia, unspecified: Secondary | ICD-10-CM

## 2021-10-01 DIAGNOSIS — R0609 Other forms of dyspnea: Secondary | ICD-10-CM

## 2021-10-01 DIAGNOSIS — R002 Palpitations: Secondary | ICD-10-CM

## 2021-10-01 DIAGNOSIS — R931 Abnormal findings on diagnostic imaging of heart and coronary circulation: Secondary | ICD-10-CM

## 2021-10-01 NOTE — Patient Instructions (Addendum)
Medication Instructions:  No changes   *If you need a refill on your cardiac medications before your next appointment, please call your pharmacy*   Lab Work:   Fasting Lipid and liver panel in Sept/Oct 2023  Follow up with [facility] at [facility] -discuss options for cholesterol    If you have labs (blood work) drawn today and your tests are completely normal, you will receive your results only by: [place] (if you have MyChart) OR A paper copy in the mail If you have any lab test that is abnormal or we need to change your treatment, we will call you to review the results.   Testing/Procedures:  Not needed  Follow-Up: At [facility], you and your health needs are our priority.  As part of our continuing mission to provide you with exceptional heart care, we have created designated Provider Care Teams.  These Care Teams include your primary Cardiologist (physician) and Advanced Practice Providers (APPs -  Physician Assistants and Nurse Practitioners) who all work together to provide you with the care you need, when you need it.     Your next appointment:   6 to 8 month(s)  The format for your next appointment:   In Person  Provider:   Dr Glenetta Hew

## 2021-10-01 NOTE — Progress Notes (Unsigned)
Primary Care Provider: Ginger Organ., MD  [facility] Cardiologist: Shirlee More, MD Electrophysiologist: None  Clinic Note: No chief complaint on file.  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 who is being seen today for the evaluation of Coronary Artery Disease-with Angina, HTN, Thoracic Aortic Aneurysm, DOE at the request of Ginger Organ., MD.  Aaron Stout was last seen on December 28, 2018 by Laurann Montana, NP.  At that time he was indicated to have a right bundle branch block which has not been consistent.  Noted his blood pressure was pretty well controlled.  Noted exertional dyspnea that was stable.-Indicated that he is some pursed lip breathing with exertion.  Had PFTs pending. => Was educated on RBBB.  Encouraged use of albuterol At that time he was on amlodipine 5 mg daily, aspirin, metoprolol succinate 50 mg daily, but not on statin.  Recent Hospitalizations: None  Seen 07/19/2021: To reestablish cardiology care.  In the past he has seen Dr. Acie Fredrickson back in 2010-2014.  He then was seen by Dr. Shirlee More in 2019 for chest pain.  This was evaluated with a coronary CTA leading to cardiac catheterization (performed by me-Reviewed above).  As a result of this test started on amlodipine 5 mg plus Toprol 50 mg as well as aspirin.  He states that up until the fall 2022 he was doing fine with no more chest pain symptoms.  Unfortunately last fall, he started having recurrence of the chest discomfort as well as dyspnea, and reduced exercise tolerance/energy. The chest pain is described as a central chest radiating pressure that  is there constantly, but mostly at rest.  Not exacerbated by exertion.  It can last most the day.  He does have exertional dyspnea and fatigue is also present.  He notes that more so than chest discomfort he feels his heart rate going up faster than usual with exertion leading to dyspnea.  In response to this change in symptoms, his PCP initially increased the Toprol to 100 mg daily in November 22.  Shortly thereafter, in January symptoms have not changed, infected progressively got worse.  Therefore Dr. Brigitte Pulse switched him to [facility].  This also did not necessarily change his symptoms.  He then followed back up again with one of the APP's at [facility], and apparently the consensus was to start on olmesartan.  The confusing feature was whether or not he was supposed to continue Bystolic or not.  He felt like the plan was to stop Bystolic  Other concerning issues he discussed with her that he has gained weight and has become more sedentary.  This was mentioned by his wife.  He also notes that since stopping the Bystolic he has had palpitations more frequently.  He then brought up the concern of right bundle branch block.  We reviewed several EKGs and there were some EKGs indicating incomplete right bundle-branch block but no true complete right bundle-branch block noted.  There has been EKGs intermittently that did not have a) block and his EKG today does not have it.  My suspicion is that this is a rate related right bundle.  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    At the end of the visit, he then showed results of a CT scan performed at [facility] radiology showing thoracic aortic ectasia measuring 4.3 cm.  Reviewed  CV studies:    The following studies were reviewed today: (if available, images/films reviewed: From Epic Chart or Care Everywhere)  Cardiac Cath 05/14/2008: Minor luminal  irregularities.  Normal LV function. TTE 09/09/2009: Low normal LV function, EF 50 to 55%.  No RWMA.  GR 1 DD. Trace MR and TR as well as PI.   Myoview 04/11/2012: Low risk, no ischemia infarction.  Mildly reduced EF.  ------------------ Coronary CTA 08/23/2017: Coronary Calcium Score 719.  Mild eccentric calcified plaque in the Left Main.  Diffuse calcified plaque with moderate to severe plaque in the proximal to mid LAD along with diffuse 50 to 69% stenosis (cannot exclude greater than 70% stenosis in the mid LAD. => Unable to perform CT FFR because of motion artifact.  Mild ostial calcified plaque in OM1 25 to 50%.  Mild diffuse plaque in the mid RCA 25 to 50%. => Ascending aorta measured at 39 mm.  No calcification.  Cardiac Cath 09/12/2017: Normal LV size and function.  EF 55 to 65%.  Normal LVEDP.  Extensive extraluminal calcification throughout the LAD but no significant stenosis noted in the LAD or diagonal branch.  Proximal-mid LCx 40%. => Consider the possibility of symptomatic microvascular disease. As result, was placed on aspirin, Toprol 50 mg and amlodipine 5 mg.  ------------ Chest CT 07/31/2018: : No change in 7 mm pulmonary nodule superior RLL.  Aortic and coronary atherosclerosis noted.  Three-vessel CAD calcification.  CT chest without IV contrast 08/04/2020 ([facility]): Stable pulmonary nodule.  Fusiform ectasia of the ascending aorta-4.3 cm.  Heavy coronary calcification  Echo 07/28/21:   1. Left ventricular ejection fraction, by estimation, is 60 to 65%. The left ventricle has normal function. The left ventricle has no regional wall motion abnormalities. Left ventricular diastolic parameters are consistent with Grade I diastolic dysfunction (impaired relaxation).   2. Right ventricular systolic function is normal. The right ventricular size is normal.   3. The mitral valve is normal in structure. Mild mitral valve regurgitation. No evidence of mitral stenosis.   4. The aortic valve is  normal in structure. Aortic valve regurgitation is not visualized. No aortic stenosis is present.   5. Aneurysm of the ascending aorta, measuring 40 mm.   6. The inferior vena cava is normal in size with greater than 50% respiratory variability, suggesting right atrial pressure of 3 mmHg.     Interval History:   Aaron Stout presents here  CV Review of Symptoms (Summary) Cardiovascular ROS: positive for - chest pain, dyspnea on exertion, edema, irregular heartbeat, palpitations, rapid heart rate, shortness of breath, and Worsening fatigue and exercise intolerance. negative for - loss of consciousness or TIA/amaurosis fugax or claudication  REVIEWED OF SYSTEMS   Review of Systems  Constitutional:  Positive for malaise/fatigue. Negative for weight loss (Weight gain).  HENT:  Positive for congestion. Negative for nosebleeds.   Respiratory:  Positive for shortness of breath (Per HPI). Negative for cough and wheezing.   Cardiovascular:        Per HPI  Gastrointestinal:  Negative for blood in stool and melena.  Genitourinary:  Negative for frequency and hematuria.  Musculoskeletal:  Positive for falls (Unsteady gait, poor balance) and joint  pain. Negative for myalgias.  Neurological:  Positive for dizziness (Poor balance.).  Psychiatric/Behavioral:  Positive for depression. Negative for memory loss. The patient is nervous/anxious and has insomnia.     I have reviewed and (if needed) personally updated the patient's problem list, medications, allergies, past medical and surgical history, social and family history.   PAST MEDICAL HISTORY   Past Medical History:  Diagnosis Date   Adenomatous colon polyp 2001   ALLERGIC RHINITIS 05/18/2007   Qualifier: Diagnosis of  By: Ronnald Ramp RN, [place], [place]     Anxiety    Arthritis 2013   arthritis-hands; Per PCPs notes-inflammatory arthritis   ASTHMA 05/18/2007   Qualifier: Diagnosis of  By: Ronnald Ramp RN, CGRN, Sheri     Bipolar 1 disorder ([place])     With depression   Cancer ([place])    hx skin cancer   Chest tightness    Chronic fatigue syndrome    Chronic headaches    d/t old neck injury   Complication of anesthesia    x1 episode "panic attack" awakening-none in recent years   Coronary artery disease    Diverticulosis    Ectatic thoracic aorta ([place]) 07/2018   CT chest-please warm thoracic ascending aorta ectasia measuring 4.3 cm.   GASTROESOPHAGEAL REFLUX DISEASE 05/18/2007   Qualifier: Diagnosis of  By: Ronnald Ramp RN, CGRN, Sheri     HEADACHE, CHRONIC 05/18/2007   Qualifier: Diagnosis of  By: Ronnald Ramp RN, CGRN, Sheri     Heart murmur    benign    History of degenerative disc disease    HOH (hard of hearing)    slight-bilateral   HSV-2 (herpes simplex virus 2) infection    Per PCP-genital   Hyperlipidemia 05/18/2007   Qualifier: Diagnosis of  By: Ronnald Ramp RN, CGRN, Sheri     Hyperlipidemia due to dietary fat intake    Hypertension    IBS (irritable bowel syndrome)    Incidental lung nodule, > 18m and < 82m2019   Stable is a 2022   Internal hemorrhoids with other complication [id number] Irritable bowel syndrome 05/18/2007   Qualifier: Diagnosis of  By: JoRonnald RampN, CGRN, Sheri     Lyme disease    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  40% proximal to mid LCx.   OSA (obstructive sleep apnea)    better s/p UPP-no problems now   Personal history of adenomatous colonic polyps 05/18/2007   2001, 2004, 2006, 2009 (last with one small adenoma) Max 4 polyps in past with largest 7 mm (both in 2004)  09/11/2013 7 small polyps adenomas  02/2017 5 adeoimas max 10 mm repeat colon 03/2020     PONV (postoperative nausea and vomiting)    Prostatitis    PROSTATITIS, CHRONIC 05/18/2007   Qualifier: Diagnosis of  By: JoRonnald RampN, CGRN, Sheri     Radiculopathy of cervical spine    Syncope 05/2008   1 episode-evaluated by cardiologist-negative  findings-?related to low B/p (hydration tends to help).   Testicular hypofunction    Vitamin D deficiency     PAST SURGICAL HISTORY   Past Surgical History:  Procedure Laterality Date   [id number]   CHOLECYSTECTOMY N/A 10/08/2014   Procedure: LAPAROSCOPIC CHOLECYSTECTOMY WITH INTRAOPERATIVE CHOLANGIOGRAM;  Surgeon: ThErroll LunaMD;  [facility] Service: General;  Laterality: N/A;   COLONOSCOPY  02/20/2017   GeCarlean Purlpolyps   COLONOSCOPY  05/15/2020   [facility]   COLONOSCOPY W/ BIOPSIES  multiple   CT CTA CORONARY W/CA SCORE W/CM &/OR WO/CM  08/23/2017   Coronary Calcium Score 719.  Mild eccentric calcified plaque in the Left Main.  Diffuse calcified plaque with moderate to severe plaque in the proximal to mid LAD along with diffuse 50 to 69% stenosis (cannot exclude greater than 70% stenosis in the mid LAD. => NO CT FFR 2/2 motion artifact.  Mild calcified plaque ost OM1 25 to 50%.  Mild diffuse plaque mid RCA 25 to 50%. Ascending aorta -39 mm. .   ESOPHAGOGASTRODUODENOSCOPY  multiple   GREEN LIGHT LASER TURP (TRANSURETHRAL RESECTION OF PROSTATE N/A 06/01/2012   Procedure: GREEN LIGHT LASER OF PROSTATE ;  Surgeon: Ailene Rud, MD;  Location: [facility];  Service: Urology;  Laterality: N/A;   HAND SURGERY  1971   tendon transplantation(injury)   LEFT HEART CATH AND CORONARY ANGIOGRAPHY  05/2008   Minor luminal irregularity.  Normal LV function.  (Dr. Acie Fredrickson)   LEFT HEART CATH AND CORONARY ANGIOGRAPHY N/A 09/12/2017   Procedure: LEFT HEART CATH AND CORONARY ANGIOGRAPHY;  Surgeon: Leonie Man, MD;  Location: [place] [facility];  Service: Cardiovascular; normal LV size and function.  EF 55 to 65%.  Normal LVEDP.  Extensive extraluminal calcification throughout the LAD but no notable stenosis in the LAD or diagonal branch.  Proximal LAD and mid LCx 40%.   MEDIAL PARTIAL KNEE REPLACEMENT Right    NASAL POLYP EXCISION     NM MYOVIEW LTD  04/2012   Mildly reduced  EF.  Low risk.  No ischemia or infarction.   POLYPECTOMY     TENNIS ELBOW RELEASE/NIRSCHEL PROCEDURE Right    TONSILLECTOMY     TRANSTHORACIC ECHOCARDIOGRAM  09/2009   [facility]): Low normal LV function, EF 50 to 55%.  No RWMA.  GR 1 DD. Trace MR and TR as well as PI.   UPPER GASTROINTESTINAL ENDOSCOPY     UVULOPALATOPHARYNGOPLASTY  1990    Immunization History  Administered Date(s) Administered   Influenza,inj,Quad PF,6+ Mos 04/09/2017   Tdap 05/09/2014   Zoster Recombinat (Shingrix) 05/21/2017, 08/17/2017    MEDICATIONS/ALLERGIES   Current Meds  Medication Sig   albuterol (PROVENTIL HFA;VENTOLIN HFA) 108 (90 Base) MCG/ACT inhaler Inhale 2 puffs into the lungs 2 (two) times daily. (Patient taking differently: Inhale 2 puffs into the lungs as needed.)   [START ON 07/21/2021] amphetamine-dextroamphetamine (ADDERALL XR) 20 MG 24 hr capsule Take 1 capsule (20 mg total) by mouth 2 (two) times daily.   amphetamine-dextroamphetamine (ADDERALL XR) 20 MG 24 hr capsule Take 1 capsule (20 mg total) by mouth 2 (two) times daily.   amphetamine-dextroamphetamine (ADDERALL XR) 20 MG 24 hr capsule Take 1 capsule (20 mg total) by mouth 2 (two) times daily.   Ascorbic Acid (VITAMIN C) 1000 MG tablet Take 1,000 mg by mouth 2 (two) times daily.   azelastine (ASTELIN) 0.1 % nasal spray Place 1 spray into both nostrils as needed for rhinitis. Use in each nostril as directed   Cholecalciferol (VITAMIN D) 2000 UNITS tablet Take 3,000 Units by mouth daily.    clotrimazole-betamethasone (LOTRISONE) cream Apply 1 application topically 2 (two) times daily as needed (for psoriasis).   Coenzyme Q10 (CO Q-10) 100 MG CAPS Take 100 mg by mouth daily.   colestipol (COLESTID) 5 g packet TAKE ONE AND A HALF PACKETS DAILY   Cyanocobalamin (VITAMIN B 12 PO) Take 5,000 mcg by mouth at bedtime.  DHEA 25 MG CAPS Take 25 mg by mouth daily.    escitalopram (LEXAPRO) 5 MG tablet Take 1.5 tablets (7.5 mg total)  by mouth daily.   fexofenadine (ALLEGRA) 180 MG tablet Take 180 mg by mouth at bedtime.   fluticasone (FLONASE) 50 MCG/ACT nasal spray Place 1 spray into both nostrils as needed for allergies or rhinitis.   montelukast (SINGULAIR) 10 MG tablet Take 10 mg by mouth daily.   Multiple Vitamin (MULTIVITAMIN) tablet Take 1 tablet by mouth daily.   olmesartan (BENICAR) 20 MG tablet Take 20 mg by mouth daily.   Omega-3 Fatty Acids (FISH OIL) 1000 MG CAPS Take 2,000 mg by mouth 2 (two) times daily.    Omeprazole-Sodium Bicarbonate (ZEGERID) 20-1100 MG CAPS capsule Take 1 capsule by mouth 2 (two) times daily.    OVER THE COUNTER MEDICATION Take 100 mg by mouth daily. Pregnenolone supplement   Psyllium (FIBER) 0.52 G CAPS Take 4.16 g by mouth at bedtime.    Quercetin 250 MG TABS Take 2 tablets by mouth 2 (two) times daily.   QUEtiapine (SEROQUEL) 25 MG tablet Take 3 tablets (75 mg total) by mouth at bedtime.   Turmeric 500 MG CAPS Take 500 mg by mouth 2 (two) times daily.    valACYclovir (VALTREX) 500 MG tablet Take 500 mg by mouth 2 (two) times daily.    WIXELA INHUB 100-50 MCG/ACT AEPB Inhale 1 puff into the lungs 2 (two) times daily.   '[]'$  amLODipine (NORVASC) 5 MG tablet Take 1 tablet (5 mg total) by mouth daily.    Allergies  Allergen Reactions   Duloxetine Hcl Other (See Comments)    drowsiness   Ibuprofen Hives   Voltaren [Diclofenac] Shortness Of Breath    Also had palpitations   Penicillins Other (See Comments)    Unknown Has patient had a PCN reaction causing immediate rash, facial/tongue/throat swelling, SOB or lightheadedness with hypotension: Unknown Has patient had a PCN reaction causing severe rash involving mucus membranes or skin necrosis: Unknown Has patient had a PCN reaction that required hospitalization: Unknown Has patient had a PCN reaction occurring within the last 10 years: No If all of the above answers are "NO", then may proceed with Cephalosporin use.  Childhood  response    Dilaudid [Hydromorphone Hcl] Nausea And Vomiting   Simvastatin Other (See Comments)    Cramps    SOCIAL HISTORY/FAMILY HISTORY   Reviewed in Epic:   Social History   Tobacco Use   Smoking status: Former    Packs/day: 0.50    Years: 7.00    Total pack years: 3.50    Types: Cigarettes    Start date: 03/07/1977    Quit date: 07/06/1980    Years since quitting: 41.2   Smokeless tobacco: Never  Vaping Use   Vaping Use: Never used  Substance Use Topics   Alcohol use: No   Drug use: No   Social History   Social History Narrative   Married father of 12 sons   47 year old son graduated [employer].  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[employer], site            Family History  Problem Relation Age of Onset   Breast cancer Mother 36   Heart failure Father 74   Bowel Disease Father 34  Bowel obstruction   CAD Brother 63       PCI   Heart attack Brother 59   CAD Brother 40       PCI   Hepatitis B Brother    Heart attack Maternal Grandfather    Congestive Heart Failure Maternal Grandfather    COPD Maternal Grandfather    Heart disease Other        grandfather/uncle   Alcoholism Other        greatgrandfather/uncle     Colon polyps Other        uncle   Bipolar disorder Son    Ehlers-Danlos syndrome Son    Migraines Son    Colon cancer Neg Hx    Esophageal cancer Neg Hx    Rectal cancer Neg Hx    Stomach cancer Neg Hx     OBJCTIVE -PE, EKG, labs   Wt Readings from Last 3 Encounters:  10/01/21 242 lb (109.8 kg)  07/19/21 250 lb 9.6 oz (113.7 kg)  06/11/20 251 lb (113.9 kg)    Physical Exam: BP 132/66   Pulse 74   Ht '6\' 1"'$  (1.854 m)   Wt 242 lb (109.8 kg)   SpO2 98%   BMI 31.93 kg/m  Physical Exam Constitutional:      General: He is not in acute distress.    Appearance: He is obese. He is not  ill-appearing or toxic-appearing.  HENT:     Head: Normocephalic and atraumatic.  Neck:     Vascular: No carotid bruit or JVD.  Cardiovascular:     Rate and Rhythm: Normal rate and regular rhythm. No extrasystoles are present.    Chest Wall: PMI is not displaced.     Pulses: Intact distal pulses.     Heart sounds: S1 normal and S2 normal. Heart sounds are distant. No murmur heard.    No friction rub. No gallop.  Pulmonary:     Effort: Pulmonary effort is normal. No respiratory distress.     Breath sounds: Normal breath sounds. No wheezing, rhonchi or rales.  Chest:     Chest wall: Tenderness present.  Abdominal:     General: Abdomen is flat. Bowel sounds are normal. There is no distension.     Palpations: Abdomen is soft. There is no mass (No HSM or bruit).     Tenderness: There is no abdominal tenderness. There is no guarding or rebound.  Musculoskeletal:        General: No swelling. Normal range of motion.     Cervical back: Normal range of motion and neck supple.  Skin:    General: Skin is warm and dry.     Coloration: Skin is not pale.  Neurological:     General: No focal deficit present.     Mental Status: He is alert and oriented to person, place, and time.     Cranial Nerves: No cranial nerve deficit.  Psychiatric:     Comments: Somewhat down.  Flat affect.  Anxious.  Very analytical.      Adult ECG Report  Rate: 78 ;  Rhythm: normal sinus rhythm, sinus arrhythmia, and - LAFB (axis -62 ), normal intervals and durations.  Normal voltage.  There is no criteria for incomplete RBBB. ;   Narrative Interpretation: Criteria for incomplete RBBB is not present, likely lead placement versus rate related incomplete RBBB.  Recent Labs:   07/17/2021: Na 140, K 3.9, Cl  106, BUN 21, Cr 1.1, Ca 8.9, AST 26, ALT 33,  ALP 48  Lab Results  Component Value Date   CREATININE 1.05 07/27/2021   BUN 15 07/27/2021   NA 139 07/27/2021   K 4.3 07/27/2021   CL 102 07/27/2021   CO2 23  07/27/2021      Latest Ref Rng & Units 09/12/2017    5:56 AM 09/11/2017    2:25 PM 05/26/2017    3:40 PM  CBC  WBC 4.0 - 10.5 K/uL 8.7  7.6  8.1   Hemoglobin 13.0 - 17.0 g/dL 14.5  15.1  14.0   Hematocrit 39.0 - 52.0 % 43.7  43.6  40.5   Platelets 150 - 400 K/uL 209  223  214     No results found for: "HGBA1C" Lab Results  Component Value Date   TSH 0.63 04/05/2012    ==================================================  COVID-19 Education: The signs and symptoms of COVID-19 were discussed with the patient and how to seek care for testing (follow up with PCP or arrange E-visit).    I spent a total of 43 minutes with the patient spent in direct patient consultation.  -> Prolonged interview.  Multiple issues discussed.  Pathophysiology reviewed, questions asked and answered.  We Reviewed cardiac cath films etc. Additional time spent with chart review  / charting (studies, outside notes, etc): 69 min -> Multiple cardiac studies reviewed including previous cath films, echocardiogram, Myoview results, Coronary CTA as well as CTA of the aorta.  All these are reviewed and past medical and surgical history updated along with family history.  I personally reviewed the cath films, with Myoview and CTA images. Total Time: 112 min  Current medicines are reviewed at length with the patient today.  (+/- concerns) multiple questions reviewed above  This visit occurred during the SARS-CoV-2 public health emergency.  Safety protocols were in place, including screening questions prior to the visit, additional usage of staff PPE, and extensive cleaning of exam room while observing appropriate contact time as indicated for disinfecting solutions.  Notice: This dictation was prepared with Dragon dictation along with smart phrase technology. Any transcriptional errors that result from this process are unintentional and may not be corrected upon review.   Studies Ordered:  No orders of the defined types  were placed in this encounter.  No orders of the defined types were placed in this encounter.   Patient Instructions / Medication Changes & Studies & Tests Ordered   There are no Patient Instructions on file for this visit.     Glenetta Hew, M.D., M.S. Interventional Cardiologist   Pager # [phone] Phone # [phone] [address] [place], [place] [address]   Thank you for choosing [facility]care at [facility]!!

## 2021-10-02 ENCOUNTER — Encounter: Payer: Self-pay | Admitting: Cardiology

## 2021-10-02 NOTE — Assessment & Plan Note (Signed)
He did pretty wellness CPX.  He said that from the time you increase his amlodipine dose the symptoms significantly improved.  He still has some chest discomfort but nowhere near the level of intensity.  The fact that he can go over 11 minutes on exercise would say that he does not have macrovascular ischemic CAD-we are treating him for microvascular ischemia with amlodipine.    I do not know that he would get much benefit from nitrate, therefore Ranexa would be the next option.Marland Kitchen

## 2021-10-02 NOTE — Assessment & Plan Note (Signed)
Blood pressure actually is pretty good on current dose of telmisartan and amlodipine.  This still little more room to go up on the olmesartan, but if he had more issues with hypotension I would probably consider adding a diuretic such as chlorthalidone since he has some lower extremity edema.  His potassium and creatinine can tolerate it.

## 2021-10-02 NOTE — Assessment & Plan Note (Signed)
Has mild flip-flopping sensations but nothing consistent or worrisome.  We will prefer to avoid beta-blockers.

## 2021-10-02 NOTE — Assessment & Plan Note (Signed)
Stable ectasia at roughly 4.2 cm.  At this point, its been stable for several years.  We can probably wait at least 2 more years to followed up.  He has an ultrasound ordered for the summer which can probably be canceled.

## 2021-10-02 NOTE — Assessment & Plan Note (Signed)
Unfortunately, CT FFR was not performed therefore the Coronary CTA was not accurately assessed for ischemia.  Cardiac catheterization revealed minimal obstructive disease with mostly positive remodeling.  For future anginal assessment, would consider either Myoview, or Stress PET

## 2021-10-02 NOTE — Assessment & Plan Note (Signed)
He did pretty well on CPX but there was some evidence of mild chronotropic incompetence.  Only able to reach 81% max rate heart rate.  He had not done very well with beta-blockers in the past noting fatigue and weakness.  We now have another contraindication for beta-blocker use.  CPX suggested body habitus over deconditioning as the main etiology of his dyspnea.

## 2021-10-02 NOTE — Assessment & Plan Note (Signed)
Intolerant of the simvastatin, rosuvastatin and atorvastatin in the past.  He is on colestipol and DHEA as well as fish oil and fiber.   Lipids after last visit showed LDL of 111 with borderline mmHg TG of 185.  With him being statin intolerant, we probably would need to consider other options for lipid modification.  LDL of 111 is not likely to be appreciably decreased with Nexletol, error Zetia or even combination of Nexlizet. He would like to try additional exercise and dietary modifications before considering other medications. We will reassess labs in 3 months-if still not improving, will refer to CV[facility] to discuss PCSK9 inhibitors, or inclisiran

## 2021-10-02 NOTE — Assessment & Plan Note (Signed)
Nonocclusive CAD seen on cardiac catheterization with positive remodeling only.  This was contradictory to the Coronary CTA results there was significant disease.  Going forward, would consider Myoview for full ischemic evaluation.  We just evaluated atypical symptoms with CPX showing excellent effort with mild chronotropic incompetence-=> which would further exclude beta-blocker therapy.  Plan: Continue with amlodipine initiated for the treatment of microvascular disease.  Continue ARB for afterload reduction I would low threshold to add chlorthalidone for additional blood pressure control.  He is not currently on aspirin, but we did recommend him being on 81 mg.  Needs better lipid control.  Reassess labs in 3 months and consider CVRR consultation for consideration of PCSK9 inhibitor versus inclisiran.

## 2021-10-15 ENCOUNTER — Ambulatory Visit: Payer: No Typology Code available for payment source | Admitting: Physician Assistant

## 2021-12-02 ENCOUNTER — Encounter: Payer: Self-pay | Admitting: Pharmacist Clinician (PhC)/ Clinical Pharmacy Specialist

## 2021-12-02 ENCOUNTER — Ambulatory Visit
Payer: No Typology Code available for payment source | Attending: Cardiovascular Disease | Admitting: Pharmacist Clinician (PhC)/ Clinical Pharmacy Specialist

## 2021-12-02 DIAGNOSIS — E782 Mixed hyperlipidemia: Secondary | ICD-10-CM | POA: Diagnosis not present

## 2021-12-02 LAB — LIPID PANEL
Chol/HDL Ratio: 4 ratio (ref 0.0–5.0)
Cholesterol, Total: 166 mg/dL (ref 100–199)
HDL: 41 mg/dL (ref >39)
LDL Chol Calc (NIH): 100 mg/dL — ABNORMAL HIGH (ref 0–99)
Triglycerides: 142 mg/dL (ref 0–149)
VLDL Cholesterol Cal: 25 mg/dL (ref 5–40)

## 2021-12-02 LAB — HEPATIC FUNCTION PANEL
ALT: 32 IU/L (ref 0–44)
AST: 21 IU/L (ref 0–40)
Albumin: 4.2 g/dL (ref 3.9–4.9)
Alkaline Phosphatase: 56 IU/L (ref 44–121)
Bilirubin Total: 0.3 mg/dL (ref 0.0–1.2)
Bilirubin, Direct: 0.12 mg/dL (ref 0.00–0.40)
Total Protein: 6.6 g/dL (ref 6.0–8.5)

## 2021-12-02 NOTE — Patient Instructions (Signed)
Your Results:             Your most recent labs Goal  Total Cholesterol 185 < 200  Triglycerides 185 < 150  HDL (happy/good cholesterol) 37 > 40  LDL (lousy/bad cholesterol 111 < 70   Medication changes:  I will call you early next week once we have the results of the cholesterol labs drawn Thursday.     Tommy Medal PharmD  Thank you for choosing [facility]

## 2021-12-02 NOTE — Progress Notes (Signed)
12/02/2021 Aaron MCCREADY 28-May-1955 [id number]   HPI:  Aaron Stout is a 66 y.o. male patient of Dr Aaron Stout, who presents today for a lipid clinic evaluation.  See pertinent past medical history below.  He was lost to follow up for about a year and returned with complaints of chest discomfort and weight gain. He was having palpitations which became worse after stopping his beta-blockers. After increasing his amlodipine, he feels much better, has more energy and palpitations less often. He reported some lower extremity edema. His most recent LDL was 111 and he reported at his last visit that he was intolerant of simvastatin, rosuvastatin and atorvastatin in the past, reporting myalgias and muscle cramps on all of them. Coronary calcium score from 2019 was 719 which was 92nd percentile matched for control, diffuse 50-69% stenosis and suspicion for >70% stenosis in mid LAD. Heart cath performed after in 2019 noted extensive calcification but less stenosis in LAD than expected, prior circumflex lesion is 40% stenosed. At his last visit he was very hesitant to try any of the statins again and was nervous about starting any new medications.  He has made lifestyle changes since last visit. His biggest change is he has gotten rid of sugary drinks, eliminating them completely along with a lot of sugar from his diet. 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 He is trying to start walking more again but has issues with hip pain, so he does not always get to.  Past Medical History: CAD Calcium score 719, 92nd percentile, suspicion for > 70% LAD occlusion  HTN   Thoracic aortic aneurysm Stable at 4.2 cm, on 2 year follow up  OSA       Current Medications:  Colestipol 7.5 gm daily (1.5 packets), (also helps with digestion after gall bladder removal), omega 3 OTC 2 gm bid   Cholesterol Goals: LDL < 70   Intolerant/previously  tried: simvastatin, atorvastatin, rosuvastatin, pitavastatin - myalgias  Family history: dad-heart failure, mom okay, grandfather died of heart attack, two brothers are twins-both had stents (LAD), younger brother ok, two boys doing ok   Diet: mostly home-cooked meals, wife is good cook and aware of diet, lots of vegetables and fruits, fresh vegetables,   Social history: non-smoker quit in past when 28, no alcohol  Labs:  6/23:  TC 185, TG 185, HDL 37, LDL 111   Current Outpatient Medications  Medication Sig Dispense Refill   albuterol (PROVENTIL HFA;VENTOLIN HFA) 108 (90 Base) MCG/ACT inhaler Inhale 2 puffs into the lungs 2 (two) times daily. (Patient taking differently: Inhale 2 puffs into the lungs as needed.) 1 Inhaler 3   amLODipine (NORVASC) 10 MG tablet Take 1 tablet (10 mg total) by mouth daily. 180 tablet 3   amphetamine-dextroamphetamine (ADDERALL XR) 20 MG 24 hr capsule Take 1 capsule (20 mg total) by mouth 2 (two) times daily. 60 capsule 0   Ascorbic Acid (VITAMIN C) 1000 MG tablet Take 1,000 mg by mouth 2 (two) times daily.     azelastine (ASTELIN) 0.1 % nasal spray Place 1 spray into both nostrils as needed for rhinitis. Use in each nostril as directed     Cholecalciferol (VITAMIN D) 2000 UNITS tablet Take 3,000 Units by mouth daily.      clotrimazole-betamethasone (LOTRISONE) cream Apply 1 application topically 2 (two) times daily as needed (for psoriasis).     Coenzyme Q10 (CO  Q-10) 100 MG CAPS Take 100 mg by mouth daily.     colestipol (COLESTID) 5 g packet TAKE ONE AND A HALF PACKETS DAILY 115 packet 1   Cyanocobalamin (VITAMIN B 12 PO) Take 5,000 mcg by mouth at bedtime.      DHEA 25 MG CAPS Take 25 mg by mouth daily.      escitalopram (LEXAPRO) 5 MG tablet Take 1.5 tablets (7.5 mg total) by mouth daily. 135 tablet 1   fexofenadine (ALLEGRA) 180 MG tablet Take 180 mg by mouth at bedtime.     fluticasone (FLONASE) 50 MCG/ACT nasal spray Place 1 spray into both nostrils as  needed for allergies or rhinitis.     montelukast (SINGULAIR) 10 MG tablet Take 10 mg by mouth daily.     Multiple Vitamin (MULTIVITAMIN) tablet Take 1 tablet by mouth daily.     olmesartan (BENICAR) 20 MG tablet Take 20 mg by mouth daily.     Omega-3 Fatty Acids (FISH OIL) 1000 MG CAPS Take 2,000 mg by mouth 2 (two) times daily.      Omeprazole-Sodium Bicarbonate (ZEGERID) 20-1100 MG CAPS capsule Take 1 capsule by mouth 2 (two) times daily.      OVER THE COUNTER MEDICATION Take 100 mg by mouth daily. Pregnenolone supplement     Psyllium (FIBER) 0.52 G CAPS Take 4.16 g by mouth at bedtime.      QUEtiapine (SEROQUEL) 25 MG tablet Take 3 tablets (75 mg total) by mouth at bedtime. 270 tablet 1   Turmeric 500 MG CAPS Take 500 mg by mouth 2 (two) times daily.      valACYclovir (VALTREX) 500 MG tablet Take 500 mg by mouth 2 (two) times daily.      WIXELA INHUB 100-50 MCG/ACT AEPB Inhale 1 puff into the lungs 2 (two) times daily.     No current facility-administered medications for this visit.    Allergies  Allergen Reactions   Duloxetine Hcl Other (See Comments)    drowsiness   Ibuprofen Hives   Voltaren [Diclofenac] Shortness Of Breath    Also had palpitations   Penicillins Other (See Comments)    Unknown Has patient had a PCN reaction causing immediate rash, facial/tongue/throat swelling, SOB or lightheadedness with hypotension: Unknown Has patient had a PCN reaction causing severe rash involving mucus membranes or skin necrosis: Unknown Has patient had a PCN reaction that required hospitalization: Unknown Has patient had a PCN reaction occurring within the last 10 years: No If all of the above answers are "NO", then may proceed with Cephalosporin use.  Childhood response    Dilaudid [Hydromorphone Hcl] Nausea And Vomiting   Simvastatin Other (See Comments)    Cramps    Past Medical History:  Diagnosis Date   Adenomatous colon polyp 2001   ALLERGIC RHINITIS 05/18/2007    Qualifier: Diagnosis of  By: Ronnald Ramp RN, [place], [place]     Anxiety    Arthritis 2013   arthritis-hands; Per PCPs notes-inflammatory arthritis   ASTHMA 05/18/2007   Qualifier: Diagnosis of  By: Ronnald Ramp RN, CGRN, Sheri     Bipolar 1 disorder ([place])    With depression   Cancer ([place])    hx skin cancer   Chest tightness    Chronic fatigue syndrome    Chronic headaches    d/t old neck injury   Complication of anesthesia    x1 episode "panic attack" awakening-none in recent years   Diverticulosis    Ectatic thoracic aorta ([place]) 07/2018   Follow-up  CTA chest 07/27/2021: Ascending aortic dilation 4.2 cm.  No change.   GASTROESOPHAGEAL REFLUX DISEASE 05/18/2007   Qualifier: Diagnosis of  By: Ronnald Ramp RN, CGRN, Sheri     HEADACHE, CHRONIC 05/18/2007   Qualifier: Diagnosis of  By: Ronnald Ramp RN, CGRN, Sheri     Heart murmur    benign    History of degenerative disc disease    HOH (hard of hearing)    slight-bilateral   HSV-2 (herpes simplex virus 2) infection    Per PCP-genital   Hyperlipidemia 05/18/2007   Qualifier: Diagnosis of  By: Ronnald Ramp RN, CGRN, Sheri     Hyperlipidemia due to dietary fat intake    Hypertension    IBS (irritable bowel syndrome)    Incidental lung nodule, > 25m and < 851m2019   Stable is a 2022   Internal hemorrhoids with other complication 0700/17/4944 Irritable bowel syndrome 05/18/2007   Qualifier: Diagnosis of  By: JoRonnald RampN, CGRN, Sheri     Lyme disease    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  40% proximal to mid LCx.   OSA (obstructive sleep apnea)    better s/p UPP-no problems now   Personal history of adenomatous colonic polyps 05/18/2007   2001, 2004, 2006, 2009 (last with one small adenoma) Max 4 polyps in past with largest 7 mm (both in 2004)  09/11/2013 7 small polyps adenomas  02/2017 5 adeoimas max 10 mm repeat colon 03/2020     PONV (postoperative  nausea and vomiting)    Prostatitis    PROSTATITIS, CHRONIC 05/18/2007   Qualifier: Diagnosis of  By: JoRonnald RampN, CGRN, Sheri     Radiculopathy of cervical spine    Syncope 05/2008   1 episode-evaluated by cardiologist-negative findings-?related to low B/p (hydration tends to help).   Testicular hypofunction    Vitamin D deficiency     There were no vitals taken for this visit.   Hyperlipidemia Patient has made some lifestyle changes cutting out sugary drinks completely and has started teaching again and walking more. He reports losing 20 lbs since his last visit and is trying to lose 1 lb/week. His most recent LDL was 111 and his goal is <70 due his coronary calcium score of 719 from 2019 which put him in the 92nd percentile. Offered more lifestyles change options such as eating more lean meats such as poultry, using vegetable oils and trying steel cut oats and eating oatmeal 3x/week. Discussed the option of using rosuvastatin 5 mg once a week and titrating dose to maximum tolerated, ideally 2-3x/week as well as the option of ezetimibe 10 mg daily by itself. Discussed the newer cholesterol options as well (Repatha, Praulent, Nexletol/Nexlizet). Patient was having his cholesterol checked again today and was more interested in seeing what the numbers come back at before making any decisions. Will await lab results early next week and follow up with patient then.  TiRodolph BongharmD candidate class of 2024 [employer]  I was with student and patient for entire interview and agree with above assessment and plan.   KrTommy MedalharmD CPP [id number] StreetuChackbayrTroyNC [phone]

## 2021-12-02 NOTE — Assessment & Plan Note (Addendum)
Patient has made some lifestyle changes cutting out sugary drinks completely and has started teaching again and walking more. He reports losing 20 lbs since his last visit and is trying to lose 1 lb/week. His most recent LDL was 111 and his goal is <70 due his coronary calcium score of 719 from 2019 which put him in the 92nd percentile. Offered more lifestyles change options such as eating more lean meats such as poultry, using vegetable oils and trying steel cut oats and eating oatmeal 3x/week. Discussed the option of using rosuvastatin 5 mg once a week and titrating dose to maximum tolerated, ideally 2-3x/week as well as the option of ezetimibe 10 mg daily by itself. Discussed the newer cholesterol options as well (Repatha, Praulent, Nexletol/Nexlizet). Patient was having his cholesterol checked again today and was more interested in seeing what the numbers come back at before making any decisions. Will await lab results early next week and follow up with patient then.

## 2021-12-08 ENCOUNTER — Encounter: Payer: Self-pay | Admitting: Pharmacist Clinician (PhC)/ Clinical Pharmacy Specialist

## 2022-01-06 ENCOUNTER — Encounter: Payer: Self-pay | Admitting: Cardiology

## 2022-01-07 MED ORDER — RANOLAZINE ER 500 MG PO TB12
500.0000 mg | ORAL_TABLET | Freq: Two times a day (BID) | ORAL | 6 refills | Status: DC
Start: 2022-01-07 — End: 2022-02-25

## 2022-01-07 NOTE — Telephone Encounter (Signed)
Called patient left message on personal voice mail [place] advised to take Ranexa 500 mg twice a day for chest pain.I will send in prescription to [facility] [place] will call back next week to schedule appointment with [place].

## 2022-02-16 ENCOUNTER — Encounter: Payer: Self-pay | Admitting: Cardiology

## 2022-02-17 NOTE — Telephone Encounter (Signed)
He will need to come in to be seen by either me or APP at first available.  Too difficult to figure this out via telephone calls.    Glenetta Hew, MD

## 2022-02-18 ENCOUNTER — Other Ambulatory Visit: Payer: Self-pay | Admitting: Psychiatry

## 2022-02-18 DIAGNOSIS — F4310 Post-traumatic stress disorder, unspecified: Secondary | ICD-10-CM

## 2022-02-18 DIAGNOSIS — F411 Generalized anxiety disorder: Secondary | ICD-10-CM

## 2022-02-25 ENCOUNTER — Ambulatory Visit: Payer: No Typology Code available for payment source | Attending: Nurse Practitioner | Admitting: Nurse Practitioner

## 2022-02-25 ENCOUNTER — Encounter: Payer: Self-pay | Admitting: Nurse Practitioner

## 2022-02-25 ENCOUNTER — Telehealth: Payer: Self-pay

## 2022-02-25 VITALS — BP 114/67 | HR 69 | Ht 73.0 in | Wt 233.4 lb

## 2022-02-25 DIAGNOSIS — R002 Palpitations: Secondary | ICD-10-CM

## 2022-02-25 DIAGNOSIS — I25118 Atherosclerotic heart disease of native coronary artery with other forms of angina pectoris: Secondary | ICD-10-CM | POA: Diagnosis not present

## 2022-02-25 DIAGNOSIS — R0609 Other forms of dyspnea: Secondary | ICD-10-CM

## 2022-02-25 DIAGNOSIS — I7121 Aneurysm of the ascending aorta, without rupture: Secondary | ICD-10-CM | POA: Diagnosis not present

## 2022-02-25 DIAGNOSIS — I10 Essential (primary) hypertension: Secondary | ICD-10-CM

## 2022-02-25 DIAGNOSIS — E785 Hyperlipidemia, unspecified: Secondary | ICD-10-CM

## 2022-02-25 MED ORDER — ISOSORBIDE MONONITRATE ER 30 MG PO TB24: 15.0000 mg | ORAL_TABLET | Freq: Every day | ORAL | 3 refills | Status: DC

## 2022-02-25 NOTE — Patient Instructions (Addendum)
Medication Instructions:  Stop Ranexa as directed Start Imdur 15 mg daily  *If you need a refill on your cardiac medications before your next appointment, please call your pharmacy*   Lab Work: NONE ordered at this time of appointment   If you have labs (blood work) drawn today and your tests are completely normal, you will receive your results only by: MyChart Message (if you have MyChart) OR A paper copy in the mail If you have any lab test that is abnormal or we need to change your treatment, we will call you to review the results.   Testing/Procedures: NONE ordered at this time of appointment     Follow-Up: At [facility], you and your health needs are our priority.  As part of our continuing mission to provide you with exceptional heart care, we have created designated Provider Care Teams.  These Care Teams include your primary Cardiologist (physician) and Advanced Practice Providers (APPs -  Physician Assistants and Nurse Practitioners) who all work together to provide you with the care you need, when you need it.  We recommend signing up for the patient portal called "MyChart".  Sign up information is provided on this After Visit Summary.  MyChart is used to connect with patients for Virtual Visits (Telemedicine).  Patients are able to view lab/test results, encounter notes, upcoming appointments, etc.  Non-urgent messages can be sent to your provider as well.   To learn more about what you can do with MyChart, go to [email or web address].    Your next appointment:   March 2024 (MD only)  Follow up (PharmD) The format for your next appointment:   In Person  Provider:   Dr. Ellyn Hack     Other Instructions   Important Information About Sugar

## 2022-02-25 NOTE — Telephone Encounter (Signed)
Opened in error

## 2022-02-25 NOTE — Progress Notes (Signed)
Office Visit    Patient Name: Aaron Stout Date of Encounter: 02/25/2022  Primary Care Provider:  Ginger Organ., MD Primary Cardiologist:  Glenetta Hew, MD  Chief Complaint   66 year old male with a history of CAD with chronic chest pain, hypertension, hyperlipidemia, dilation of ascending aorta, palpitations, bipolar disorder, and anxiety who presents for follow-up related to CAD and chest pain.  Past Medical History    Past Medical History:  Diagnosis Date   Adenomatous colon polyp 2001   ALLERGIC RHINITIS 05/18/2007   Qualifier: Diagnosis of  By: Ronnald Ramp RN, [place], [place]     Anxiety    Arthritis 2013   arthritis-hands; Per PCPs notes-inflammatory arthritis   ASTHMA 05/18/2007   Qualifier: Diagnosis of  By: Ronnald Ramp RN, CGRN, Sheri     Bipolar 1 disorder ([place])    With depression   Cancer ([place])    hx skin cancer   Chest tightness    Chronic fatigue syndrome    Chronic headaches    d/t old neck injury   Complication of anesthesia    x1 episode "panic attack" awakening-none in recent years   Diverticulosis    Ectatic thoracic aorta ([place]) 07/2018   Follow-up CTA chest 07/27/2021: Ascending aortic dilation 4.2 cm.  No change.   GASTROESOPHAGEAL REFLUX DISEASE 05/18/2007   Qualifier: Diagnosis of  By: Ronnald Ramp RN, CGRN, Sheri     HEADACHE, CHRONIC 05/18/2007   Qualifier: Diagnosis of  By: Ronnald Ramp RN, CGRN, Sheri     Heart murmur    benign    History of degenerative disc disease    HOH (hard of hearing)    slight-bilateral   HSV-2 (herpes simplex virus 2) infection    Per PCP-genital   Hyperlipidemia 05/18/2007   Qualifier: Diagnosis of  By: Ronnald Ramp RN, CGRN, Sheri     Hyperlipidemia due to dietary fat intake    Hypertension    IBS (irritable bowel syndrome)    Incidental lung nodule, > 43m and < 86m2019   Stable is a 2022   Internal hemorrhoids with other complication 0768/34/[id number] Irritable bowel syndrome 05/18/2007   Qualifier: Diagnosis of  By: JoRonnald RampN, CGRN,  Sheri     Lyme disease    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  40% proximal to mid LCx.   OSA (obstructive sleep apnea)    better s/p UPP-no problems now   Personal history of adenomatous colonic polyps 05/18/2007   2001, 2004, 2006, 2009 (last with one small adenoma) Max 4 polyps in past with largest 7 mm (both in 2004)  09/11/2013 7 small polyps adenomas  02/2017 5 adeoimas max 10 mm repeat colon 03/2020     PONV (postoperative nausea and vomiting)    Prostatitis    PROSTATITIS, CHRONIC 05/18/2007   Qualifier: Diagnosis of  By: JoRonnald RampN, CGRN, Sheri     Radiculopathy of cervical spine    Syncope 05/2008   1 episode-evaluated by cardiologist-negative findings-?related to low B/p (hydration tends to help).   Testicular hypofunction    Vitamin D deficiency    Past Surgical History:  Procedure Laterality Date   BACK SURGERY  1975, 1985   Cardiopulmonary Exercise Test  07/29/2021   Preexercise spirometry was normal.  Exercised on cycle ergometer 11:45 min  -> 132 Watts => Resting HR 73 - Peak 124 (81% MPHR), Peak VO2 90% predicted..  Normal functional  capacity.  No indication of cardiopulmonary rotation.  When corrected for ideal body weight, results suggest that body habitus is the primary contributing to her dyspnea.  Mild chronotropic competence noted.   CHOLECYSTECTOMY N/A 10/08/2014   Procedure: LAPAROSCOPIC CHOLECYSTECTOMY WITH INTRAOPERATIVE CHOLANGIOGRAM;  Surgeon: Erroll Luna, MD;  Location: [place];  Service: General;  Laterality: N/A;   COLONOSCOPY  02/20/2017   Gessner- polyps   COLONOSCOPY  05/15/2020   Gessner   COLONOSCOPY W/ BIOPSIES  multiple   CT CTA CORONARY W/CA SCORE W/CM &/OR WO/CM  08/23/2017   Coronary Calcium Score 719.  Mild eccentric calcified plaque in the Left Main.  Diffuse calcified plaque with moderate to severe plaque in the proximal to mid LAD  along with diffuse 50 to 69% stenosis (cannot exclude greater than 70% stenosis in the mid LAD. => NO CT FFR 2/2 motion artifact.  Mild calcified plaque ost OM1 25 to 50%.  Mild diffuse plaque mid RCA 25 to 50%. Ascending aorta -39 mm. .   ESOPHAGOGASTRODUODENOSCOPY  multiple   GREEN LIGHT LASER TURP (TRANSURETHRAL RESECTION OF PROSTATE N/A 06/01/2012   Procedure: GREEN LIGHT LASER OF PROSTATE ;  Surgeon: Ailene Rud, MD;  Location: [facility];  Service: Urology;  Laterality: N/A;   HAND SURGERY  1971   tendon transplantation(injury)   LEFT HEART CATH AND CORONARY ANGIOGRAPHY  05/2008   Minor luminal irregularity.  Normal LV function.  (Dr. Acie Fredrickson)   LEFT HEART CATH AND CORONARY ANGIOGRAPHY N/A 09/12/2017   Procedure: LEFT HEART CATH AND CORONARY ANGIOGRAPHY;  Surgeon: Leonie Man, MD;  Location: [facility];  Service: Cardiovascular; normal LV size and function.  EF 55 to 65%.  Normal LVEDP.  Extensive extraluminal calcification throughout the LAD but no notable stenosis in the LAD or diagonal branch.  Proximal LAD and mid LCx 40%.   MEDIAL PARTIAL KNEE REPLACEMENT Right    NASAL POLYP EXCISION     NM MYOVIEW LTD  04/2012   Mildly reduced EF.  Low risk.  No ischemia or infarction.   POLYPECTOMY     TENNIS ELBOW RELEASE/NIRSCHEL PROCEDURE Right    TONSILLECTOMY     TRANSTHORACIC ECHOCARDIOGRAM  07/28/2021   : Normal LV size and function.  EF 60 to 65%.  No RWMA.  GR 1 DD.  Normal RV with normal RVSP, and normal RAP.Marland Kitchen  Normal AOV and MV.  Ascending aorta measuring 40 mm.   UPPER GASTROINTESTINAL ENDOSCOPY     UVULOPALATOPHARYNGOPLASTY  1990    Allergies  Allergies  Allergen Reactions   Duloxetine Hcl Other (See Comments)    drowsiness   Ibuprofen Hives   Voltaren [Diclofenac] Shortness Of Breath    Also had palpitations   Penicillins Other (See Comments)    Unknown Has patient had a PCN reaction causing immediate rash, facial/tongue/throat swelling, SOB or  lightheadedness with hypotension: Unknown Has patient had a PCN reaction causing severe rash involving mucus membranes or skin necrosis: Unknown Has patient had a PCN reaction that required hospitalization: Unknown Has patient had a PCN reaction occurring within the last 10 years: No If all of the above answers are "NO", then may proceed with Cephalosporin use.  Childhood response    Dilaudid [Hydromorphone Hcl] Nausea And Vomiting   Simvastatin Other (See Comments)    Cramps    History of Present Illness    66 year old male with the above past medical history including CAD with chronic chest pain, hypertension, hyperlipidemia, dilation of the ascending aorta,  palpitations, bipolar disorder, and anxiety.  Coronary CTA in 08/2017 revealed calcium score of 719 (92nd percentile), FFR was inconclusive.  Follow-up cardiac catheterization in 2019 revealed nonobstructive CAD, normal LV function, EF 55 to 65%.  Echocardiogram in 07/2021 showed EF 60 to 65%, normal LV function, no RWMA, G1 DD, normal RV systolic function, mild mitral valve regurgitation, dilation of the ascending aorta, measuring 40 mm.  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  Cardiopulmonary exercise test in 07/2021 demonstrated normal functional capacity,  mild chronotropic incompetence.  Has had chronic chest pain and has been treated for microvascular angina.  He was last seen in the office on 10/01/2021 and reported stable symptoms.  He was referred to lipid clinic Pharm.D. given history of statin intolerance.  He contacted our office on 01/06/2022 with reports of shortness of breath, fatigue, and chest pain.  He was started on Ranexa.  Contacted our office on 02/17/2022 and noted ongoing chest pain, shortness of breath, fatigue, and dizziness.  He was advised to discontinue Ranexa.  He presents today for follow-up companied by his wife.  Since his last visit he has noted constant "angina."  His  symptoms have been stable but remain unchanged from prior visits.  He stopped taking Ranexa and his symptoms of dizziness resolved.  He denies any significant palpitations, dyspnea.  BP has been stable.  He is concerned about his ongoing chest pain.     Home Medications    Current Outpatient Medications  Medication Sig Dispense Refill   albuterol (PROVENTIL HFA;VENTOLIN HFA) 108 (90 Base) MCG/ACT inhaler Inhale 2 puffs into the lungs 2 (two) times daily. (Patient taking differently: Inhale 2 puffs into the lungs as needed.) 1 Inhaler 3   amLODipine (NORVASC) 10 MG tablet Take 1 tablet (10 mg total) by mouth daily. 180 tablet 3   amphetamine-dextroamphetamine (ADDERALL XR) 20 MG 24 hr capsule Take 1 capsule (20 mg total) by mouth 2 (two) times daily. 60 capsule 0   Ascorbic Acid (VITAMIN C) 1000 MG tablet Take 1,000 mg by mouth 2 (two) times daily.     azelastine (ASTELIN) 0.1 % nasal spray Place 1 spray into both nostrils as needed for rhinitis. Use in each nostril as directed     Cholecalciferol (VITAMIN D) 2000 UNITS tablet Take 3,000 Units by mouth daily.      clotrimazole-betamethasone (LOTRISONE) cream Apply 1 application topically 2 (two) times daily as needed (for psoriasis).     Coenzyme Q10 (CO Q-10) 100 MG CAPS Take 100 mg by mouth daily.     colestipol (COLESTID) 5 g packet TAKE ONE AND A HALF PACKETS DAILY 115 packet 1   Cyanocobalamin (VITAMIN B 12 PO) Take 5,000 mcg by mouth at bedtime.      DHEA 25 MG CAPS Take 25 mg by mouth daily.      escitalopram (LEXAPRO) 5 MG tablet TAKE 1.5 TABLETS BY MOUTH DAILY. 45 tablet 0   fexofenadine (ALLEGRA) 180 MG tablet Take 180 mg by mouth at bedtime.     fluticasone (FLONASE) 50 MCG/ACT nasal spray Place 1 spray into both nostrils as needed for allergies or rhinitis.     isosorbide mononitrate (IMDUR) 30 MG 24 hr tablet Take 0.5 tablets (15 mg total) by mouth daily. 90 tablet 3   montelukast (SINGULAIR) 10 MG tablet Take 10 mg by mouth daily.      Multiple Vitamin (MULTIVITAMIN) tablet Take 1 tablet by mouth daily.  olmesartan (BENICAR) 20 MG tablet Take 20 mg by mouth daily.     Omega-3 Fatty Acids (FISH OIL) 1000 MG CAPS Take 2,000 mg by mouth 2 (two) times daily.      Omeprazole-Sodium Bicarbonate (ZEGERID) 20-1100 MG CAPS capsule Take 1 capsule by mouth 2 (two) times daily.      OVER THE COUNTER MEDICATION Take 100 mg by mouth daily. Pregnenolone supplement     Psyllium (FIBER) 0.52 G CAPS Take 4.16 g by mouth at bedtime.      QUEtiapine (SEROQUEL) 25 MG tablet Take 3 tablets (75 mg total) by mouth at bedtime. 270 tablet 1   Turmeric 500 MG CAPS Take 500 mg by mouth 2 (two) times daily.      valACYclovir (VALTREX) 500 MG tablet Take 500 mg by mouth 2 (two) times daily.      WIXELA INHUB 100-50 MCG/ACT AEPB Inhale 1 puff into the lungs 2 (two) times daily.     No current facility-administered medications for this visit.     Review of Systems    He denies palpitations, dyspnea, pnd, orthopnea, n, v, dizziness, syncope, edema, weight gain, or early satiety. All other systems reviewed and are otherwise negative except as noted above.  Physical Exam    VS:  BP 114/67   Pulse 69   Ht '6\' 1"'$  (1.854 m)   Wt 233 lb 6.4 oz (105.9 kg)   SpO2 99%   BMI 30.79 kg/m   GEN: Well nourished, well developed, in no acute distress. HEENT: normal. Neck: Supple, no JVD, carotid bruits, or masses. Cardiac: RRR, no murmurs, rubs, or gallops. No clubbing, cyanosis, edema.  Radials/DP/PT 2+ and equal bilaterally.  Respiratory:  Respirations regular and unlabored, clear to auscultation bilaterally. GI: Soft, nontender, nondistended, BS + x 4. MS: no deformity or atrophy. Skin: warm and dry, no rash. Neuro:  Strength and sensation are intact. Psych: Normal affect.  Accessory Clinical Findings    ECG personally reviewed by me today -no EKG in office today.    Lab Results  Component Value Date   WBC 8.7 09/12/2017   HGB 14.5 09/12/2017    HCT 43.7 09/12/2017   MCV 94.6 09/12/2017   PLT 209 09/12/2017   Lab Results  Component Value Date   CREATININE 1.05 07/27/2021   BUN 15 07/27/2021   NA 139 07/27/2021   K 4.3 07/27/2021   CL 102 07/27/2021   CO2 23 07/27/2021   Lab Results  Component Value Date   ALT 32 12/02/2021   AST 21 12/02/2021   ALKPHOS 56 12/02/2021   BILITOT 0.3 12/02/2021   Lab Results  Component Value Date   CHOL 166 12/02/2021   HDL 41 12/02/2021   LDLCALC 100 (H) 12/02/2021   LDLDIRECT 180.6 07/18/2011   TRIG 142 12/02/2021   CHOLHDL 4.0 12/02/2021    No results found for: "HGBA1C"  Assessment & Plan    1. CAD/chest pain: Coronary CTA in 08/2017 revealed calcium score of 719 (92nd percentile), FFR was inconclusive.  Follow-up cardiac catheterization in 2019 revealed nonobstructive CAD, normal LV function, EF 55 to 65%.  Cardiopulmonary exercise test in 07/2021 demonstrated normal functional capacity,  mild chronotropic incompetence. He has had chronic chest pain and has been treated for microvascular angina.  He saw no improvement with Ranexa and did not tolerate the medication due to dizziness.  He is on 10 mg of amlodipine.  We discussed addition of low-dose Imdur. Patient states he has had a history  of low blood pressure with nitroglycerin, however, he is willing to trial low-dose Imdur.  We discussed possibility of further ischemic evaluation, however, given prior workup, recent cardiopulmonary exercise test, will discuss with Dr. Ellyn Hack, primary cardiologist.  Discussed ED precautions.  Continue amlodipine, Imdur as above, olmesartan.  2. Hypertension: BP well controlled.  Has noted some higher readings at home.  However, he states his BP cuff is greater than 30 years old.  I advised him to consider purchasing a new BP cuff and continue to monitor BP at home.  I advised him to bring his BP cuff to his next follow-up visit.  Continue current antihypertensive regimen.    3. Hyperlipidemia: LDL  was 100 and 11/2021.  Intolerant to statins.  He was previously referred to lipid clinic Pharm.D.  He has not started any medication at this point in time.  Arrange for follow-up appointment with lipid clinic Pharm.D. to discuss initiation of alternative lipid-lowering therapy.  4. Palpitations: He denies any significant recent palpitations.  Stable.  5. 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   6. Disposition: Follow-up in 05/2022 with Dr. Ellyn Hack.     Lenna Sciara, NP 02/25/2022, 6:04 PM

## 2022-03-03 ENCOUNTER — Ambulatory Visit (INDEPENDENT_AMBULATORY_CARE_PROVIDER_SITE_OTHER): Payer: No Typology Code available for payment source | Admitting: Psychiatry

## 2022-03-03 ENCOUNTER — Encounter: Payer: Self-pay | Admitting: Psychiatry

## 2022-03-03 VITALS — BP 132/69 | HR 78

## 2022-03-03 DIAGNOSIS — F900 Attention-deficit hyperactivity disorder, predominantly inattentive type: Secondary | ICD-10-CM | POA: Diagnosis not present

## 2022-03-03 DIAGNOSIS — F5105 Insomnia due to other mental disorder: Secondary | ICD-10-CM | POA: Diagnosis not present

## 2022-03-03 DIAGNOSIS — F4310 Post-traumatic stress disorder, unspecified: Secondary | ICD-10-CM

## 2022-03-03 DIAGNOSIS — F411 Generalized anxiety disorder: Secondary | ICD-10-CM | POA: Diagnosis not present

## 2022-03-03 MED ORDER — AMPHETAMINE-DEXTROAMPHET ER 20 MG PO CP24: 20.0000 mg | ORAL_CAPSULE | Freq: Two times a day (BID) | ORAL | 0 refills | Status: AC

## 2022-03-03 MED ORDER — QUETIAPINE FUMARATE 25 MG PO TABS: 75.0000 mg | ORAL_TABLET | Freq: Every day | ORAL | 1 refills | Status: DC

## 2022-03-03 MED ORDER — AMPHETAMINE-DEXTROAMPHET ER 20 MG PO CP24
20.0000 mg | ORAL_CAPSULE | Freq: Two times a day (BID) | ORAL | 0 refills | Status: DC
Start: 2022-03-31 — End: 2022-06-22

## 2022-03-03 MED ORDER — ESCITALOPRAM OXALATE 5 MG PO TABS: 7.5000 mg | ORAL_TABLET | Freq: Every day | ORAL | 1 refills | Status: DC

## 2022-03-03 MED ORDER — AMPHETAMINE-DEXTROAMPHET ER 20 MG PO CP24: 20.0000 mg | ORAL_CAPSULE | Freq: Two times a day (BID) | ORAL | 0 refills | Status: DC

## 2022-03-03 NOTE — Progress Notes (Signed)
Aaron Stout [id number] 01/17/1956 66 y.o.  Subjective:   Patient ID:  Aaron Stout is a 66 y.o. (DOB 12-13-1955) male.  Chief Complaint:  Chief Complaint  Patient presents with   Follow-up   Anxiety   Post-Traumatic Stress Disorder   ADHD    Depression        Associated symptoms include fatigue.  Associated symptoms include no decreased concentration and no suicidal ideas.  Past medical history includes anxiety.   Anxiety Symptoms include chest pain, nervous/anxious behavior and shortness of breath. Patient reports no confusion, decreased concentration, dizziness, palpitations or suicidal ideas.     [name] presents to the office today for follow-up of mood and anxiety and sleep.  At visit in 2020had residual anxiety and it was suggested he disc with cardiologist the use of a beta blocker metoprolol and it helped BP and  anxiety DT multiple failed anxiety meds.  Has been generally good.    At  visit 10/27/19  Lexapro was increased from 5 to 7.'5mg'$  dailty for anxiety which is chronic. Thinks the anxiety is a helpful.  I  Chronic anxiety and stress at work.  Evaluations pending at work.  Less obsessing about that.  Has tried '10mg'$  Lexapro and he feels it was too much.  visit October 2020 without med changes.    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   Still CO sweating easily. Pretty good overall.  No major changes since here.  Same old chronic tiredness and easily OOB.  Still a little anxiety hangs on.  Not horrible and able to function and keep on going.    12/19/2019 appointment with the following noted: Fine overall.  Generally steady with mood and other factors. Chronic tiredness without change.  Trying to manage stress as much as possible. Stress Hudson back in the house and some lability continues with anger outbursts.  He always finds someone at work he doesn't like and starts ruminating on  it. SE mild hangover. Mild hangover with Seroquel which helps the sleep. Makes him sleepy in 60-90 mins. Chronic sweating and SOB. Plan: No med changes  06/18/2020 appointment with following noted: No Covid.   Knee surgery 12/21. Otherwise health is good.    02/25/21 appt noted: Taking lexapro 7.5 mg , Adderall and quetiapine 75 mg HS. No SE.  Sleep pretty good. Tiredness worse in the day.  08/26/21  appt noted:  HT at [facility] ph has the Adderall. Benfit Adderall No SE  Overll doing ok with mood and anxity. Pt reports that mood is occ irritable and Anxious and describes anxiety as mild-moderate like if too many things coming at him at once.   Anxiety symptoms include: Excessive Worry,. Pt reports no sleep issues. Pt reports that appetite is good. Pt reports that energy is good and good. Concentration is good. Suicidal thoughts:  denied by patient Plan: no med changes  03/03/22 appt noted: Steady overall.   Still some hangover effects from Seroquel.   The HT version of Adderall is less effective than the [facility] version but supply problem. Recent dx angina with microvascular Dz.  On a couple of meds for that.  Cardiology aware he's taking Adderall XR 20 BID. Saw cardiology last week.  Just started Imdur and so far less chest pain.   No SE otherwise and satisfied with meds.   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  He's aware and tries to catch it.    Past Psychiatric Medication Trials: Lexapro 10 SE, Zoloft,  Emsam,  Welllbutrin Adderall, Ritalin , Concerta, Vyvanse, seroquel 75 for sleep, perphenazine, Depakote,  Vraylar 1.5, lithium 600, Trileptal, Buspar dizzy, lamotrigine,  Belsomra,   Review of Systems:  Review of Systems  Constitutional:  Positive for fatigue.       Long history of sweating  Respiratory:  Positive for shortness of breath.   Cardiovascular:  Positive for chest pain. Negative for palpitations.   Musculoskeletal:  Positive for joint swelling.  Neurological:  Negative for dizziness and tremors.  Psychiatric/Behavioral:  Negative for agitation, behavioral problems, confusion, decreased concentration, dysphoric mood, hallucinations, self-injury, sleep disturbance and suicidal ideas. The patient is nervous/anxious. The patient is not hyperactive.     Medications: I have reviewed the patient's current medications.  Current Outpatient Medications  Medication Sig Dispense Refill   albuterol (PROVENTIL HFA;VENTOLIN HFA) 108 (90 Base) MCG/ACT inhaler Inhale 2 puffs into the lungs 2 (two) times daily. (Patient taking differently: Inhale 2 puffs into the lungs as needed.) 1 Inhaler 3   amLODipine (NORVASC) 10 MG tablet Take 1 tablet (10 mg total) by mouth daily. 180 tablet 3   [START ON 03/31/2022] amphetamine-dextroamphetamine (ADDERALL XR) 20 MG 24 hr capsule Take 1 capsule (20 mg total) by mouth 2 (two) times daily. 60 capsule 0   [START ON 04/28/2022] amphetamine-dextroamphetamine (ADDERALL XR) 20 MG 24 hr capsule Take 1 capsule (20 mg total) by mouth 2 (two) times daily. 60 capsule 0   Ascorbic Acid (VITAMIN C) 1000 MG tablet Take 1,000 mg by mouth 2 (two) times daily.     azelastine (ASTELIN) 0.1 % nasal spray Place 1 spray into both nostrils as needed for rhinitis. Use in each nostril as directed     Cholecalciferol (VITAMIN D) 2000 UNITS tablet Take 3,000 Units by mouth daily.      clotrimazole-betamethasone (LOTRISONE) cream Apply 1 application topically 2 (two) times daily as needed (for psoriasis).     Coenzyme Q10 (CO Q-10) 100 MG CAPS Take 100 mg by mouth daily.     colestipol (COLESTID) 5 g packet TAKE ONE AND A HALF PACKETS DAILY 115 packet 1   Cyanocobalamin (VITAMIN B 12 PO) Take 5,000 mcg by mouth at bedtime.      DHEA 25 MG CAPS Take 25 mg by mouth daily.      escitalopram (LEXAPRO) 5 MG tablet TAKE 1.5 TABLETS BY MOUTH DAILY. 45 tablet 0   fexofenadine (ALLEGRA) 180 MG tablet  Take 180 mg by mouth at bedtime.     fluticasone (FLONASE) 50 MCG/ACT nasal spray Place 1 spray into both nostrils as needed for allergies or rhinitis.     isosorbide mononitrate (IMDUR) 30 MG 24 hr tablet Take 0.5 tablets (15 mg total) by mouth daily. 90 tablet 3   montelukast (SINGULAIR) 10 MG tablet Take 10 mg by mouth daily.     Multiple Vitamin (MULTIVITAMIN) tablet Take 1 tablet by mouth daily.     olmesartan (BENICAR) 20 MG tablet Take 20 mg by mouth daily.     Omega-3 Fatty Acids (FISH OIL) 1000 MG CAPS Take 2,000 mg by mouth 2 (two) times daily.      Omeprazole-Sodium Bicarbonate (ZEGERID) 20-1100 MG CAPS capsule Take 1 capsule by mouth 2 (two) times daily.      OVER THE COUNTER MEDICATION Take 100 mg by mouth daily. Pregnenolone supplement  Psyllium (FIBER) 0.52 G CAPS Take 4.16 g by mouth at bedtime.      QUEtiapine (SEROQUEL) 25 MG tablet Take 3 tablets (75 mg total) by mouth at bedtime. 270 tablet 1   Turmeric 500 MG CAPS Take 500 mg by mouth 2 (two) times daily.      valACYclovir (VALTREX) 500 MG tablet Take 500 mg by mouth 2 (two) times daily.      WIXELA INHUB 100-50 MCG/ACT AEPB Inhale 1 puff into the lungs 2 (two) times daily.     amphetamine-dextroamphetamine (ADDERALL XR) 20 MG 24 hr capsule Take 1 capsule (20 mg total) by mouth 2 (two) times daily. 60 capsule 0   No current facility-administered medications for this visit.    Medication Side Effects: None ? sweating  Allergies:  Allergies  Allergen Reactions   Duloxetine Hcl Other (See Comments)    drowsiness   Ibuprofen Hives   Voltaren [Diclofenac] Shortness Of Breath    Also had palpitations   Penicillins Other (See Comments)    Unknown Has patient had a PCN reaction causing immediate rash, facial/tongue/throat swelling, SOB or lightheadedness with hypotension: Unknown Has patient had a PCN reaction causing severe rash involving mucus membranes or skin necrosis: Unknown Has patient had a PCN reaction that  required hospitalization: Unknown Has patient had a PCN reaction occurring within the last 10 years: No If all of the above answers are "NO", then may proceed with Cephalosporin use.  Childhood response    Dilaudid [Hydromorphone Hcl] Nausea And Vomiting   Simvastatin Other (See Comments)    Cramps    Past Medical History:  Diagnosis Date   Adenomatous colon polyp 2001   ALLERGIC RHINITIS 05/18/2007   Qualifier: Diagnosis of  By: Ronnald Ramp RN, [place], [place]     Anxiety    Arthritis 2013   arthritis-hands; Per PCPs notes-inflammatory arthritis   ASTHMA 05/18/2007   Qualifier: Diagnosis of  By: Ronnald Ramp RN, CGRN, Sheri     Bipolar 1 disorder ([place])    With depression   Cancer ([place])    hx skin cancer   Chest tightness    Chronic fatigue syndrome    Chronic headaches    d/t old neck injury   Complication of anesthesia    x1 episode "panic attack" awakening-none in recent years   Diverticulosis    Ectatic thoracic aorta ([place]) 07/2018   Follow-up CTA chest 07/27/2021: Ascending aortic dilation 4.2 cm.  No change.   GASTROESOPHAGEAL REFLUX DISEASE 05/18/2007   Qualifier: Diagnosis of  By: Ronnald Ramp RN, CGRN, Sheri     HEADACHE, CHRONIC 05/18/2007   Qualifier: Diagnosis of  By: Ronnald Ramp RN, CGRN, Sheri     Heart murmur    benign    History of degenerative disc disease    HOH (hard of hearing)    slight-bilateral   HSV-2 (herpes simplex virus 2) infection    Per PCP-genital   Hyperlipidemia 05/18/2007   Qualifier: Diagnosis of  By: Ronnald Ramp RN, CGRN, Sheri     Hyperlipidemia due to dietary fat intake    Hypertension    IBS (irritable bowel syndrome)    Incidental lung nodule, > 50m and < [id number]   Stable is a 2022   Internal hemorrhoids with other complication 0781/27/[id number] Irritable bowel syndrome 05/18/2007   Qualifier: Diagnosis of  By: JoRonnald RampN, CGRN, Sheri     Lyme disease    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  40% proximal to mid LCx.   OSA (obstructive sleep apnea)    better s/p UPP-no problems now   Personal history of adenomatous colonic polyps 05/18/2007   2001, 2004, 2006, 2009 (last with one small adenoma) Max 4 polyps in past with largest 7 mm (both in 2004)  09/11/2013 7 small polyps adenomas  02/2017 5 adeoimas max 10 mm repeat colon 03/2020     PONV (postoperative nausea and vomiting)    Prostatitis    PROSTATITIS, CHRONIC 05/18/2007   Qualifier: Diagnosis of  By: Ronnald Ramp RN, CGRN, Sheri     Radiculopathy of cervical spine    Syncope 05/2008   1 episode-evaluated by cardiologist-negative findings-?related to low B/p (hydration tends to help).   Testicular hypofunction    Vitamin D deficiency     Family History  Problem Relation Age of Onset   Breast cancer Mother 78   Heart failure Father 42   Bowel Disease Father 74       Bowel obstruction   CAD Brother 17       PCI   Heart attack Brother 50   CAD Brother 83       PCI   Hepatitis B Brother    Heart attack Maternal Grandfather    Congestive Heart Failure Maternal Grandfather    COPD Maternal Grandfather    Heart disease Other        grandfather/uncle   Alcoholism Other        greatgrandfather/uncle     Colon polyps Other        uncle   Bipolar disorder Son    Ehlers-Danlos syndrome Son    Migraines Son    Colon cancer Neg Hx    Esophageal cancer Neg Hx    Rectal cancer Neg Hx    Stomach cancer Neg Hx     Social History   Socioeconomic History   Marital status: Married    Spouse name: Not on file   Number of children: 2   Years of education: Not on file   Highest education level: Master's degree (e.g., MA, MS, MEng, MEd, MSW, MBA)  Occupational History   Occupation: Designer, 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  Tobacco Use   Smoking status: Former    Packs/day: 0.50    Years: 7.00    Total pack years: 3.50    Types: Cigarettes    Start date:  03/07/1977    Quit date: 07/06/1980    Years since quitting: 41.6   Smokeless tobacco: Never  Vaping Use   Vaping Use: Never used  Substance and Sexual Activity   Alcohol use: No   Drug use: No   Sexual activity: Yes    Partners: Female  Other Topics Concern   Not on file  Social History Narrative   Married father of 17 sons   53 year old son graduated [employer] at home with parents (bipolar)   55 year old son went to [employer] then [employer] as of May 2021, living at home       He has a MA in Engineer, site - & teaches Art @ [employer]   Social Determinants of Health   Financial Resource Strain: Not on file  Food Insecurity: Not on file  Transportation Needs: Not on file  Physical Activity: Not on file  Stress: Not on file  Social Connections: Not on file  Intimate Partner Violence: Not on  file    Past Medical History, Surgical history, Social history, and Family history were reviewed and updated as appropriate.   Jake dx bipolar on lithium with tremor.  Answered questions about this.  Please see review of systems for further details on the patient's review from today.   Objective:   Physical Exam:  BP 132/69   Pulse 78   Physical Exam Constitutional:      General: He is not in acute distress.    Appearance: He is well-developed.  Musculoskeletal:        General: No deformity.  Neurological:     Mental Status: He is alert and oriented to person, place, and time.     Motor: No tremor.     Coordination: Coordination normal.     Gait: Gait normal.  Psychiatric:        Attention and Perception: He is inattentive. He does not perceive auditory hallucinations.        Mood and Affect: Mood is anxious. Mood is not depressed. Affect is not labile, blunt, angry, tearful or inappropriate.        Speech: Speech normal.        Behavior: Behavior normal.        Thought Content: Thought content normal. Thought content is not delusional. Thought content does not  include homicidal or suicidal ideation. Thought content does not include suicidal plan.        Cognition and Memory: Cognition normal.        Judgment: Judgment normal.     Comments: Insight intact. No auditory or visual hallucinations.  Irritability controlled    Get's SOB walking from one building to the next.  Lab Review:     Component Value Date/Time   NA 139 07/27/2021 1420   K 4.3 07/27/2021 1420   CL 102 07/27/2021 1420   CO2 23 07/27/2021 1420   GLUCOSE 92 07/27/2021 1420   GLUCOSE 103 (H) 09/12/2017 0556   BUN 15 07/27/2021 1420   CREATININE 1.05 07/27/2021 1420   CALCIUM 10.0 07/27/2021 1420   PROT 6.6 12/02/2021 0908   ALBUMIN 4.2 12/02/2021 0908   AST 21 12/02/2021 0908   ALT 32 12/02/2021 0908   ALKPHOS 56 12/02/2021 0908   BILITOT 0.3 12/02/2021 0908   GFRNONAA >60 09/12/2017 0556   GFRAA >60 09/12/2017 0556       Component Value Date/Time   WBC 8.7 09/12/2017 0556   RBC 4.62 09/12/2017 0556   HGB 14.5 09/12/2017 0556   HGB 15.1 09/11/2017 1425   HCT 43.7 09/12/2017 0556   HCT 43.6 09/11/2017 1425   PLT 209 09/12/2017 0556   PLT 223 09/11/2017 1425   MCV 94.6 09/12/2017 0556   MCV 94 09/11/2017 1425   MCH 31.4 09/12/2017 0556   MCHC 33.2 09/12/2017 0556   RDW 13.0 09/12/2017 0556   RDW 14.1 09/11/2017 1425   LYMPHSABS 2.1 09/26/2014 1543   MONOABS 0.6 09/26/2014 1543   EOSABS 0.5 09/26/2014 1543   BASOSABS 0.0 09/26/2014 1543    No results found for: "POCLITH", "LITHIUM"   No results found for: "PHENYTOIN", "PHENOBARB", "VALPROATE", "CBMZ"   .res Assessment: Plan:    Generalized anxiety disorder  Attention deficit hyperactivity disorder (ADHD), predominantly inattentive type - Plan: amphetamine-dextroamphetamine (ADDERALL XR) 20 MG 24 hr capsule, amphetamine-dextroamphetamine (ADDERALL XR) 20 MG 24 hr capsule, amphetamine-dextroamphetamine (ADDERALL XR) 20 MG 24 hr capsule  PTSD (post-traumatic stress disorder)  Insomnia due to mental  condition   Greater than 50%  of face to face time with patient was spent on counseling and coordination of care. We discussed the change and benefit from the increase in the Lexapro for the anxiety to 7.5 mg daily.  This is the maximum tolerated dosage at this point.  Suspect residual anxiety is still a portion of what he's dealing with, but he's failed multiple meds for anxiety.  He's not sig depressed at this time.  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   Benefit from the Adderall for focus XR 20 mg BID.  Satisfied with current dose   He doesn't want changes in that.  Doubt he would get adequate duration with once daily Adderall XR.  Disc way to figure out if it contributes to sweating. Discussed potential benefits, risks, and side effects of stimulants with patient to include increased heart rate, palpitations, insomnia, increased anxiety, increased irritability, or decreased appetite.  Instructed patient to contact office if experiencing any significant tolerability issues. He is discussing cardiac issues and stimulants with cardiologist.   Sleep benefit from the Seroquel 75 mg is worth it. Plenty of sleep  Disc potential chronic fatigue is related to a post-viral syndrome.  Disc metoprolol can cause tiredness.  Disc concerns with son's recent mental health crisis.  Disc value of vitamin D for mental health and suggest trial of  5000U daily for the winter to see energy is a little better.  No med changes.  FU 6 mos  Lynder Parents, MD, DFAPA     Please see After Visit Summary for patient specific instructions.  Future Appointments  Date Time Provider [place]  06/15/2022  3:30 PM Leonie Man, MD [facility] None    No orders of the defined types were placed in this encounter.     -------------------------------

## 2022-03-11 ENCOUNTER — Other Ambulatory Visit: Payer: Self-pay

## 2022-03-11 DIAGNOSIS — F4310 Post-traumatic stress disorder, unspecified: Secondary | ICD-10-CM

## 2022-03-11 DIAGNOSIS — F5105 Insomnia due to other mental disorder: Secondary | ICD-10-CM

## 2022-03-11 DIAGNOSIS — F411 Generalized anxiety disorder: Secondary | ICD-10-CM

## 2022-03-11 MED ORDER — QUETIAPINE FUMARATE 25 MG PO TABS: 75.0000 mg | ORAL_TABLET | Freq: Every day | ORAL | 1 refills | Status: DC

## 2022-03-11 MED ORDER — ESCITALOPRAM OXALATE 5 MG PO TABS: 7.5000 mg | ORAL_TABLET | Freq: Every day | ORAL | 1 refills | Status: DC

## 2022-03-12 ENCOUNTER — Other Ambulatory Visit: Payer: Self-pay | Admitting: Physician Assistant

## 2022-03-15 ENCOUNTER — Other Ambulatory Visit: Payer: Self-pay | Admitting: Physician Assistant

## 2022-03-24 ENCOUNTER — Telehealth: Payer: Self-pay | Admitting: Physician Assistant

## 2022-03-24 NOTE — Telephone Encounter (Signed)
Pt stated that he is having  trouble refilling the Colestipol  5 g packets. Pt stated that [facility] has [facility] and none of them have the medication available, Pt stated that he called [facility] and they stated the same. I personally called [facility] and they stated they had none either. [facility] [address] [facility] that they were going to send a fax requesting similar medication to be prescribed:  Please advise :

## 2022-03-24 NOTE — Telephone Encounter (Signed)
Patient is calling states he is in need of a hemorrhoid banding and would like to see Dr Carlean Purl as soon as possible. Please advise

## 2022-03-24 NOTE — Telephone Encounter (Signed)
I recommend he try cholestyramine 4g packets   Sig take 1 and a half packets daily  #45 packets and 11 RF

## 2022-03-24 NOTE — Telephone Encounter (Signed)
Patient is calling states his pharmacy has requested an alternative for Cholecalciferol from [place] but have yet to hear back. Please advise

## 2022-03-25 ENCOUNTER — Other Ambulatory Visit: Payer: Self-pay

## 2022-03-25 DIAGNOSIS — K9089 Other intestinal malabsorption: Secondary | ICD-10-CM

## 2022-03-25 MED ORDER — CHOLESTYRAMINE 4 G PO PACK: PACK | ORAL | 11 refills | Status: DC

## 2022-04-05 ENCOUNTER — Encounter: Payer: Self-pay | Admitting: Internal Medicine

## 2022-04-10 ENCOUNTER — Encounter: Payer: Self-pay | Admitting: Cardiology

## 2022-04-11 NOTE — Telephone Encounter (Signed)
I agree.  Would be nice to know what the symptoms are.

## 2022-04-13 NOTE — Telephone Encounter (Signed)
Not too many other options for antianginal medications.  Did he try Imdur that Raquel Sarna Rx'd ?  He chose not to take anything for Lipids - so he will continue to develop atherosclerotic plaque.   Thankfully, the Cath films were pretty reassuring, as we the CPX test.    CPX suggested mildly reduced Heart Rate response to exercise, so I am reluctant to start a Beta Blocker, but we can try low dose Bisoprolol 2.5 mg daily (qhs).  The final test to assess microvascular disease is re-look cardiac catheterization to measure Cardiac Flow Reserve (unless we see a significant lesion that has developed).    [place]

## 2022-04-14 ENCOUNTER — Ambulatory Visit: Payer: No Typology Code available for payment source | Admitting: Internal Medicine

## 2022-04-14 ENCOUNTER — Encounter: Payer: Self-pay | Admitting: Internal Medicine

## 2022-04-14 VITALS — BP 136/78 | HR 72 | Ht 73.0 in | Wt 238.0 lb

## 2022-04-14 DIAGNOSIS — K429 Umbilical hernia without obstruction or gangrene: Secondary | ICD-10-CM

## 2022-04-14 DIAGNOSIS — K641 Second degree hemorrhoids: Secondary | ICD-10-CM

## 2022-04-14 DIAGNOSIS — K9089 Other intestinal malabsorption: Secondary | ICD-10-CM

## 2022-04-14 MED ORDER — COLESTIPOL HCL 5 G PO GRAN: 10.0000 g | GRANULES | Freq: Every day | ORAL | 12 refills | Status: DC

## 2022-04-14 NOTE — Progress Notes (Signed)
Aaron Stout 67 y.o. 08/23/1955 [id number]  Assessment & Plan:   Encounter Diagnoses  Name Primary?   Bile salt-induced diarrhea Yes   Prolapsed internal hemorrhoids, grade 2    Umbilical hernia without obstruction and without gangrene     Increase colestipol to 10 g daily.  He prefers to take it all at once.  Hopefully he can tolerate that.  He has been taking 7.5 g at a time for a while now and has tolerated that.  Dividing the doses is tricky with his medication regimen.  It sounds like cholestyramine is not a good choice for him given his recent mental status changes thought related to finding of other medications, presumably his Adderall and possibly Seroquel.  Also his Lexapro.  I wonder if olmesartan could be contributing to the soft stools as it can cause a gluten like enteropathy in some people.  In my experience that causes a fairly watery diarrhea.  It is an important medicine for his overall regimen so we will not make any changes to that but depending upon clinical course could consider asking Dr. Ellyn Hack about changing it to a different ARB.  I reviewed the pathophysiology of the hernia.  I think he has an umbilical/incisional hernia from prior laparoscopic cholecystectomy.  He is trying to lose some weight.  It is not symptomatic.  If it is he should pursue surgical evaluation.  Handouts were provided to the patient.  He may electively pursue surgical evaluation as well at his convenience.  Hemorrhoids were banded today.  Right posterior and left lateral.  He will follow-up in 6 to 8 weeks regarding those and his bile salt diarrhea.  CC: Ginger Organ., MD   Subjective:   Chief Complaint: Diarrhea and symptomatic hemorrhoids  HPI 67 year old white man with history of bile salt diarrhea and prior hemorrhoidal banding procedures who presents for follow-up.  He had trouble obtaining colestipol due to supply issues so I recommended he try cholestyramine.  After  that he experienced some mental status type changes and difficulty concentrating.  He followed up with his primary care office and the pharmacist there thought that even though he was taking the cholestyramine properly it was probably binding some of his other medications and leading to those problems.  After discontinuing that and happening to find some colestipol at another pharmacy he feels better and those problems have resolved.  His bile salt diarrhea responded nicely to colestipol with initial treatment but over the past 6 months or so his stools are softer and his hemorrhoids protrude and prolapse and he has difficulty cleansing.  No frank diarrhea no bleeding issues.  This is bothersome and he would like some relief.  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 Ellyn Hack.  He started that in May 2023 into the best of his recollection the softer stool started after that sometime not right around that time.  He is status post banding of all 3 columns of internal hemorrhoids in April 2022.  He had a good result for a while but they are bothersome again.  He also has question in his abdomen at the laparoscopic surgery site from prior cholecystectomy. Allergies  Allergen Reactions   Duloxetine Hcl Other (See Comments)    drowsiness   Ibuprofen Hives   Voltaren [Diclofenac] Shortness Of Breath    Also had palpitations   Penicillins Other (See Comments)  Unknown Has patient had a PCN reaction causing immediate rash, facial/tongue/throat swelling, SOB or lightheadedness with hypotension: Unknown Has patient had a PCN reaction causing severe rash involving mucus membranes or skin necrosis: Unknown Has patient had a PCN reaction that required hospitalization: Unknown Has patient had a PCN reaction occurring within the last 10 years: No If all of the above answers are "NO", then may proceed with Cephalosporin  use.  Childhood response    Dilaudid [Hydromorphone Hcl] Nausea And Vomiting   Simvastatin Other (See Comments)    Cramps   Current Meds  Medication Sig   albuterol (PROVENTIL HFA;VENTOLIN HFA) 108 (90 Base) MCG/ACT inhaler Inhale 2 puffs into the lungs 2 (two) times daily. (Patient taking differently: Inhale 2 puffs into the lungs as needed.)   amLODipine (NORVASC) 10 MG tablet Take 1 tablet (10 mg total) by mouth daily.   amphetamine-dextroamphetamine (ADDERALL XR) 20 MG 24 hr capsule Take 1 capsule (20 mg total) by mouth 2 (two) times daily.   amphetamine-dextroamphetamine (ADDERALL XR) 20 MG 24 hr capsule Take 1 capsule (20 mg total) by mouth 2 (two) times daily.   [START ON 04/28/2022] amphetamine-dextroamphetamine (ADDERALL XR) 20 MG 24 hr capsule Take 1 capsule (20 mg total) by mouth 2 (two) times daily.   Ascorbic Acid (VITAMIN C) 1000 MG tablet Take 1,000 mg by mouth 2 (two) times daily.   azelastine (ASTELIN) 0.1 % nasal spray Place 1 spray into both nostrils as needed for rhinitis. Use in each nostril as directed   Cholecalciferol (VITAMIN D) 2000 UNITS tablet Take 3,000 Units by mouth daily.    clotrimazole-betamethasone (LOTRISONE) cream Apply 1 application topically 2 (two) times daily as needed (for psoriasis).   Coenzyme Q10 (CO Q-10) 100 MG CAPS Take 100 mg by mouth daily.   Cyanocobalamin (VITAMIN B 12 PO) Take 5,000 mcg by mouth at bedtime.    DHEA 25 MG CAPS Take 25 mg by mouth daily.    escitalopram (LEXAPRO) 5 MG tablet Take 1.5 tablets (7.5 mg total) by mouth daily.   fexofenadine (ALLEGRA) 180 MG tablet Take 180 mg by mouth at bedtime.   fluticasone (FLONASE) 50 MCG/ACT nasal spray Place 1 spray into both nostrils as needed for allergies or rhinitis.   MICRONIZED COLESTIPOL HCL PO    montelukast (SINGULAIR) 10 MG tablet Take 10 mg by mouth daily.   Multiple Vitamin (MULTIVITAMIN) tablet Take 1 tablet by mouth daily.   olmesartan (BENICAR) 20 MG tablet Take 20 mg by  mouth daily.   Omega-3 Fatty Acids (FISH OIL) 1000 MG CAPS Take 2,000 mg by mouth 2 (two) times daily.    Omeprazole-Sodium Bicarbonate (ZEGERID) 20-1100 MG CAPS capsule Take 1 capsule by mouth 2 (two) times daily.    OVER THE COUNTER MEDICATION Take 100 mg by mouth daily. Pregnenolone supplement   Psyllium (FIBER) 0.52 G CAPS Take 4.16 g by mouth at bedtime.    QUEtiapine (SEROQUEL) 25 MG tablet Take 3 tablets (75 mg total) by mouth at bedtime.   Turmeric 500 MG CAPS Take 500 mg by mouth 2 (two) times daily.    valACYclovir (VALTREX) 500 MG tablet Take 500 mg by mouth 2 (two) times daily.    WIXELA INHUB 100-50 MCG/ACT AEPB Inhale 1 puff into the lungs 2 (two) times daily.   Past Medical History:  Diagnosis Date   Adenomatous colon polyp 2001   ALLERGIC RHINITIS 05/18/2007   Qualifier: Diagnosis of  By: Ronnald Ramp RN, CGRN, Sheri  Anxiety    Arthritis 2013   arthritis-hands; Per PCPs notes-inflammatory arthritis   ASTHMA 05/18/2007   Qualifier: Diagnosis of  By: Ronnald Ramp RN, CGRN, Sheri     Bipolar 1 disorder ([place])    With depression   Cancer ([place])    hx skin cancer   Chest tightness    Chronic fatigue syndrome    Chronic headaches    d/t old neck injury   Complication of anesthesia    x1 episode "panic attack" awakening-none in recent years   Diverticulosis    Ectatic thoracic aorta ([place]) 07/2018   Follow-up CTA chest 07/27/2021: Ascending aortic dilation 4.2 cm.  No change.   GASTROESOPHAGEAL REFLUX DISEASE 05/18/2007   Qualifier: Diagnosis of  By: Ronnald Ramp RN, CGRN, Sheri     HEADACHE, CHRONIC 05/18/2007   Qualifier: Diagnosis of  By: Ronnald Ramp RN, CGRN, Sheri     Heart murmur    benign    History of degenerative disc disease    HOH (hard of hearing)    slight-bilateral   HSV-2 (herpes simplex virus 2) infection    Per PCP-genital   Hyperlipidemia 05/18/2007   Qualifier: Diagnosis of  By: Ronnald Ramp RN, CGRN, Sheri     Hyperlipidemia due to dietary fat intake    Hypertension    IBS  (irritable bowel syndrome)    Incidental lung nodule, > 55m and < 844m2019   Stable is a 2022   Internal hemorrhoids with other complication 0773/22/0254 Irritable bowel syndrome 05/18/2007   Qualifier: Diagnosis of  By: JoRonnald RampN, CGRN, Sheri     Lyme disease    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  40% proximal to mid LCx.   OSA (obstructive sleep apnea)    better s/p UPP-no problems now   Personal history of adenomatous colonic polyps 05/18/2007   2001, 2004, 2006, 2009 (last with one small adenoma) Max 4 polyps in past with largest 7 mm (both in 2004)  09/11/2013 7 small polyps adenomas  02/2017 5 adeoimas max 10 mm repeat colon 03/2020     PONV (postoperative nausea and vomiting)    Prostatitis    PROSTATITIS, CHRONIC 05/18/2007   Qualifier: Diagnosis of  By: JoRonnald RampN, CGRN, Sheri     Radiculopathy of cervical spine    Syncope 05/2008   1 episode-evaluated by cardiologist-negative findings-?related to low B/p (hydration tends to help).   Testicular hypofunction    Vitamin D deficiency    Past Surgical History:  Procedure Laterality Date   BACK SURGERY  1975, 1985   Cardiopulmonary Exercise Test  07/29/2021   Preexercise spirometry was normal.  Exercised on cycle ergometer 11:45 min  -> 132 Watts => Resting HR 73 - Peak 124 (81% MPHR), Peak VO2 90% predicted..  Normal functional capacity.  No indication of cardiopulmonary rotation.  When corrected for ideal body weight, results suggest that body habitus is the primary contributing to her dyspnea.  Mild chronotropic competence noted.   CHOLECYSTECTOMY N/A 10/08/2014   Procedure: LAPAROSCOPIC CHOLECYSTECTOMY WITH INTRAOPERATIVE CHOLANGIOGRAM;  Surgeon: ThErroll LunaMD;  [facility]: [facility] Service: General;  Laterality: N/A;   COLONOSCOPY  02/20/2017   Gerhard Rappaport- polyps   COLONOSCOPY  05/15/2020   Michaeljames Milnes   COLONOSCOPY W/  BIOPSIES  multiple   CT CTA CORONARY W/CA SCORE W/CM &/OR WO/CM  08/23/2017   Coronary Calcium Score 719.  Mild eccentric calcified plaque  in the Left Main.  Diffuse calcified plaque with moderate to severe plaque in the proximal to mid LAD along with diffuse 50 to 69% stenosis (cannot exclude greater than 70% stenosis in the mid LAD. => NO CT FFR 2/2 motion artifact.  Mild calcified plaque ost OM1 25 to 50%.  Mild diffuse plaque mid RCA 25 to 50%. Ascending aorta -39 mm. .   ESOPHAGOGASTRODUODENOSCOPY  multiple   GREEN LIGHT LASER TURP (TRANSURETHRAL RESECTION OF PROSTATE N/A 06/01/2012   Procedure: GREEN LIGHT LASER OF PROSTATE ;  Surgeon: Ailene Rud, MD;  Location: [facility];  Service: Urology;  Laterality: N/A;   HAND SURGERY  1971   tendon transplantation(injury)   HEMORRHOID BANDING     LEFT HEART CATH AND CORONARY ANGIOGRAPHY  05/2008   Minor luminal irregularity.  Normal LV function.  (Dr. Acie Fredrickson)   LEFT HEART CATH AND CORONARY ANGIOGRAPHY N/A 09/12/2017   Procedure: LEFT HEART CATH AND CORONARY ANGIOGRAPHY;  Surgeon: Leonie Man, MD;  Location: [facility];  Service: Cardiovascular; normal LV size and function.  EF 55 to 65%.  Normal LVEDP.  Extensive extraluminal calcification throughout the LAD but no notable stenosis in the LAD or diagonal branch.  Proximal LAD and mid LCx 40%.   MEDIAL PARTIAL KNEE REPLACEMENT Right    NASAL POLYP EXCISION     NM MYOVIEW LTD  04/2012   Mildly reduced EF.  Low risk.  No ischemia or infarction.   POLYPECTOMY     TENNIS ELBOW RELEASE/NIRSCHEL PROCEDURE Right    TONSILLECTOMY     TRANSTHORACIC ECHOCARDIOGRAM  07/28/2021   : Normal LV size and function.  EF 60 to 65%.  No RWMA.  GR 1 DD.  Normal RV with normal RVSP, and normal RAP.Marland Kitchen  Normal AOV and MV.  Ascending aorta measuring 40 mm.   UPPER GASTROINTESTINAL ENDOSCOPY     UVULOPALATOPHARYNGOPLASTY  1990   Social History   Social History Narrative   Married father of 18 sons    10 year old son graduated Air[employer] pilot & Lives at home with parents (bipolar)   53 year old son went to [employer] then [employer] as of May 2021, living at home       He has a MA in Engineer, site - & teaches Art @ [employer]   family history includes Alcoholism in an other family member; Bipolar disorder in his son; Bowel Disease (age of onset: 22) in his father; Breast cancer (age of onset: 69) in his mother; CAD (age of onset: 78) in his brother and brother; COPD in his maternal grandfather; Colon polyps in an other family member; Congestive Heart Failure in his maternal grandfather; Ehlers-Danlos syndrome in his son; Heart attack in his maternal grandfather; Heart attack (age of onset: 5) in his brother; Heart disease in an other family member; Heart failure (age of onset: 26) in his father; Hepatitis B in his brother; Migraines in his son.   Review of Systems As per HPI  Objective:   Physical Exam BP 136/78   Pulse 72   Ht '6\' 1"'$  (1.854 m)   Wt 238 lb (108 kg)   BMI 31.40 kg/m  Well-developed well-nourished white man no acute distress Abdomen is obese, there is a baseball sized umbilical hernia involving the umbilicus and to the right of the umbilicus as well.  It is soft and reducible.  Rectal exam shows some bulging of the hemorrhoidal tissues in the right posterior area more so than the left lateral.  Digital exam is normal although there is soft brown stool.  Anoscopy is performed and demonstrates grade 2 left lateral and right posterior internal hemorrhoid complexes and a grade 1 right anterior.  PROCEDURE NOTE: The patient presents with symptomatic grade 2  hemorrhoids, requesting rubber band ligation of his/her hemorrhoidal disease.  All risks, benefits and alternative forms of therapy were described and informed consent was obtained.   The anorectum was pre-medicated with 0.125% NTG The decision was made to band the RP and LL internal hemorrhoids, and the Theodore was used to perform band ligation without complication.  Digital anorectal examination was then performed to assure proper positioning of the band, and to adjust the banded tissue as required.  The patient was discharged home without pain or other issues.  Dietary and behavioral recommendations were given and along with follow-up instructions.

## 2022-04-14 NOTE — Patient Instructions (Addendum)
HEMORRHOID BANDING PROCEDURE    FOLLOW-UP CARE   The procedure you have had should have been relatively painless since the banding of the area involved does not have nerve endings and there is no pain sensation.  The rubber band cuts off the blood supply to the hemorrhoid and the band may fall off as soon as 48 hours after the banding (the band may occasionally be seen in the toilet bowl following a bowel movement). You may notice a temporary feeling of fullness in the rectum which should respond adequately to plain Tylenol or Motrin.  Following the banding, avoid strenuous exercise that evening and resume full activity the next day.  A sitz bath (soaking in a warm tub) or bidet is soothing, and can be useful for cleansing the area after bowel movements.     To avoid constipation, take two tablespoons of natural wheat bran, natural oat bran, flax, Benefiber or any over the counter fiber supplement and increase your water intake to 7-8 glasses daily.    Unless you have been prescribed anorectal medication, do not put anything inside your rectum for two weeks: No suppositories, enemas, fingers, etc.  Occasionally, you may have more bleeding than usual after the banding procedure.  This is often from the untreated hemorrhoids rather than the treated one.  Don't be concerned if there is a tablespoon or so of blood.  If there is more blood than this, lie flat with your bottom higher than your head and apply an ice pack to the area. If the bleeding does not stop within a half an hour or if you feel faint, call our office at [phone] or go to the emergency room.  Problems are not common; however, if there is a substantial amount of bleeding, severe pain, chills, fever or difficulty passing urine (very rare) or other problems, you should call us at [phone] or report to the nearest emergency room.  Do not stay seated continuously for more than 2-3 hours for a day or two after the procedure.   Tighten your buttock muscles 10-15 times every two hours and take 10-15 deep breaths every 1-2 hours.  Do not spend more than a few minutes on the toilet if you cannot empty your bowel; instead re-visit the toilet at a later time.   We have sent the following medications to your pharmacy for you to pick up at your convenience: Colestipol (increasing the dosage)  I appreciate the opportunity to care for you. Silvano Rusk, MD, [facility]

## 2022-04-15 MED ORDER — BISOPROLOL FUMARATE 5 MG PO TABS: 2.5000 mg | ORAL_TABLET | Freq: Every day | ORAL | 6 refills | Status: DC

## 2022-04-17 IMAGING — US US THYROID
1 series · 13 of 25 positions shown · non-contrast
Comparison: None.

CLINICAL DATA: Thyroid nodule on exam

EXAM:
THYROID ULTRASOUND
TECHNIQUE: Ultrasound examination of the thyroid gland and adjacent soft
tissues was performed.

[Series 1: us thyroid · 0.07mm/px · 13 of 71 slices shown]
[im 1/71]
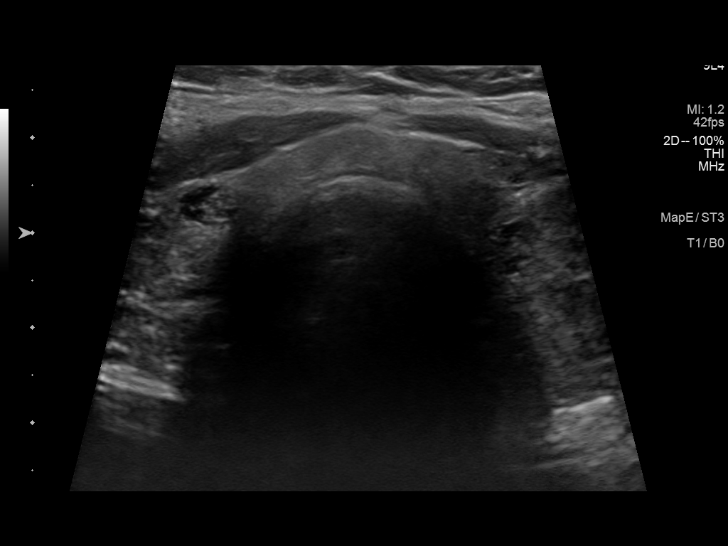
[im 6/71]
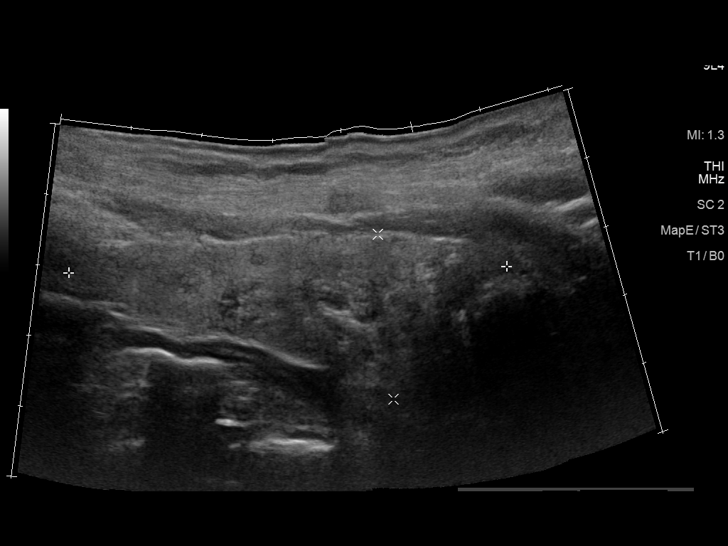
[im 12/71]
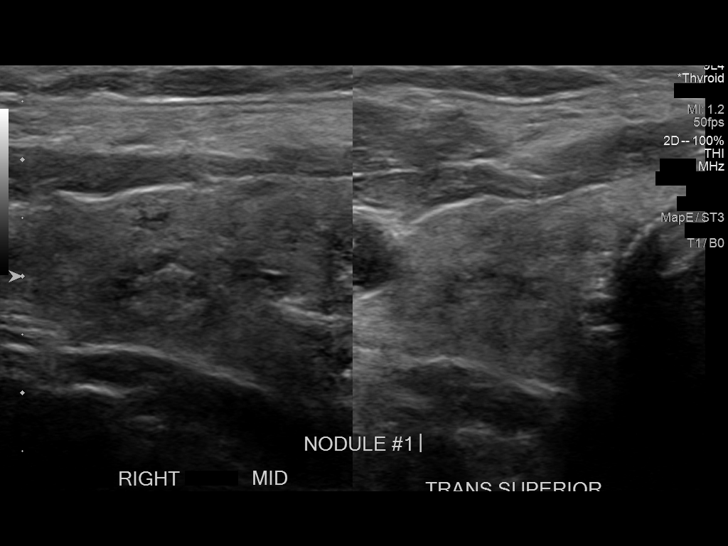
[im 18/71]
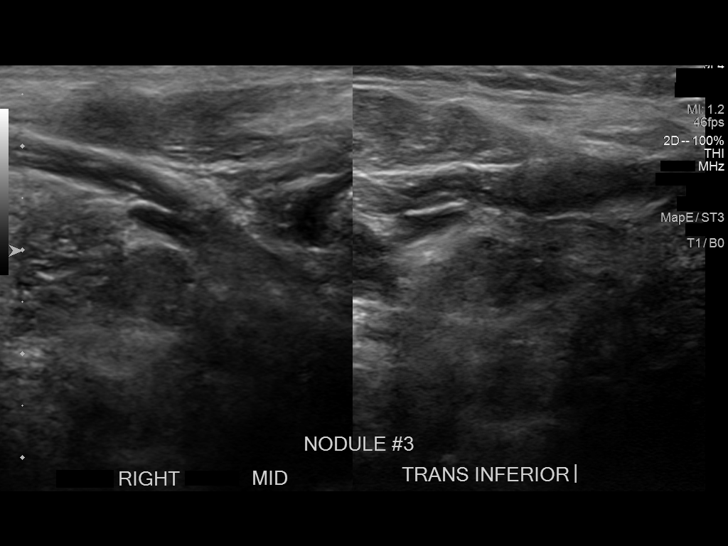
[im 24/71]
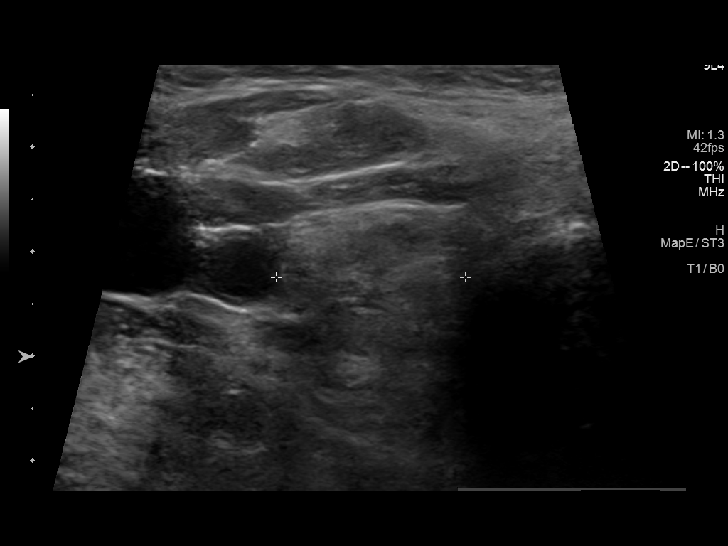
[im 30/71]
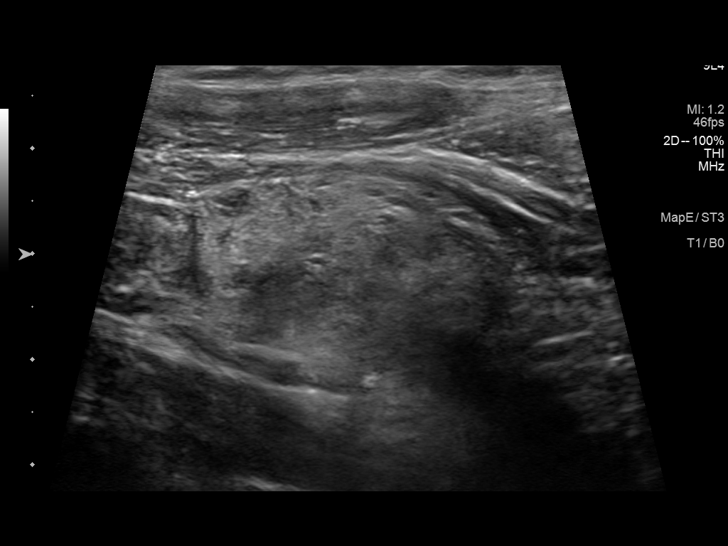
[im 36/71]
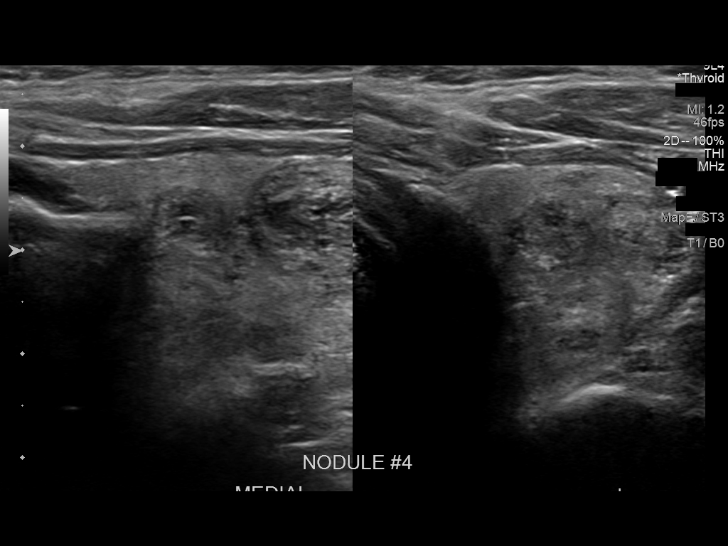
[im 41/71]
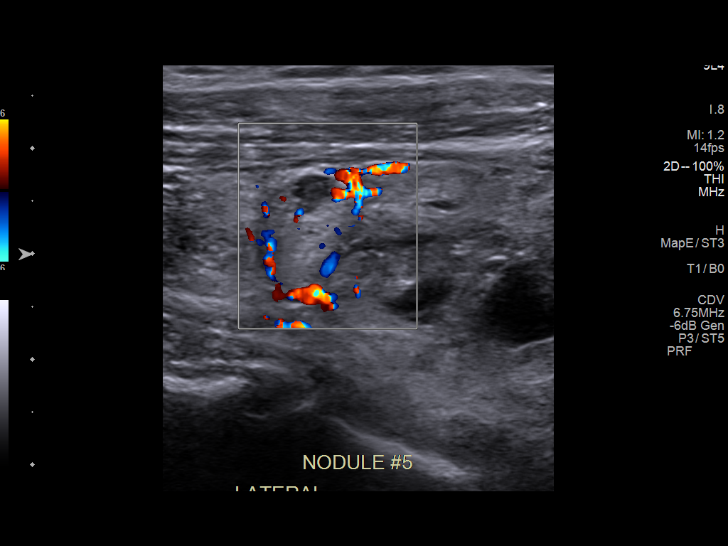
[im 47/71]
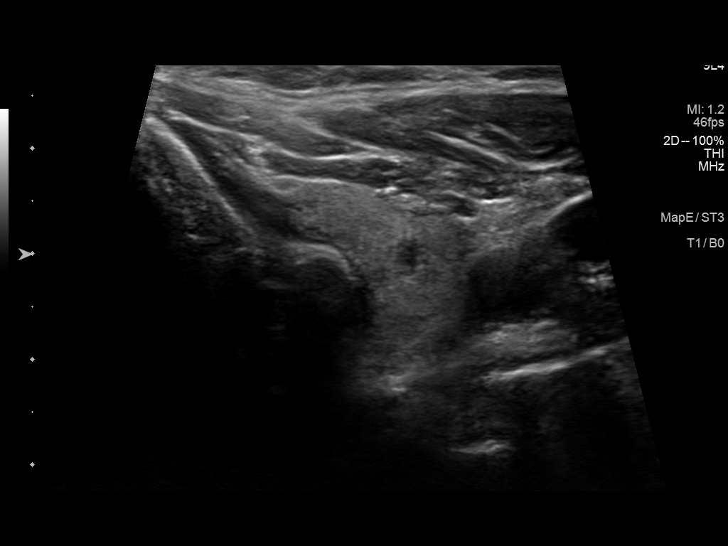
[im 53/71]
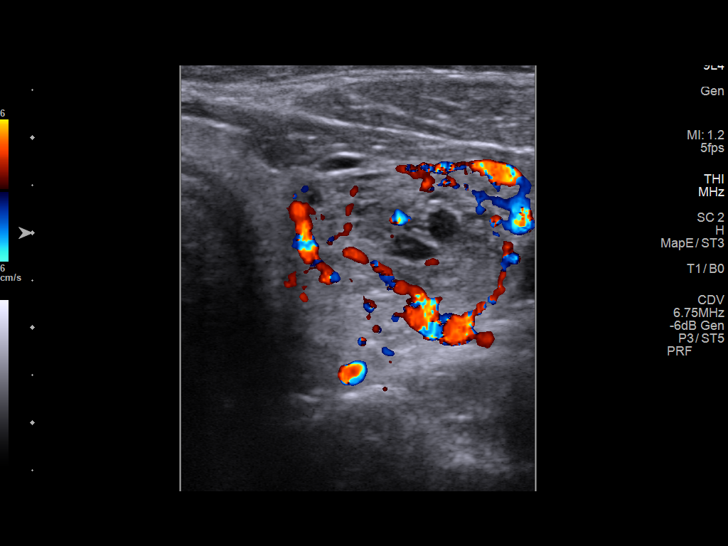
[im 59/71]
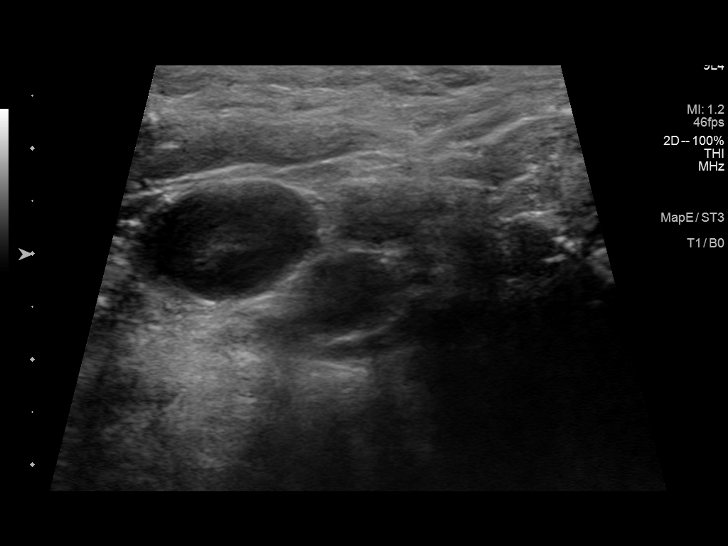
[im 65/71]
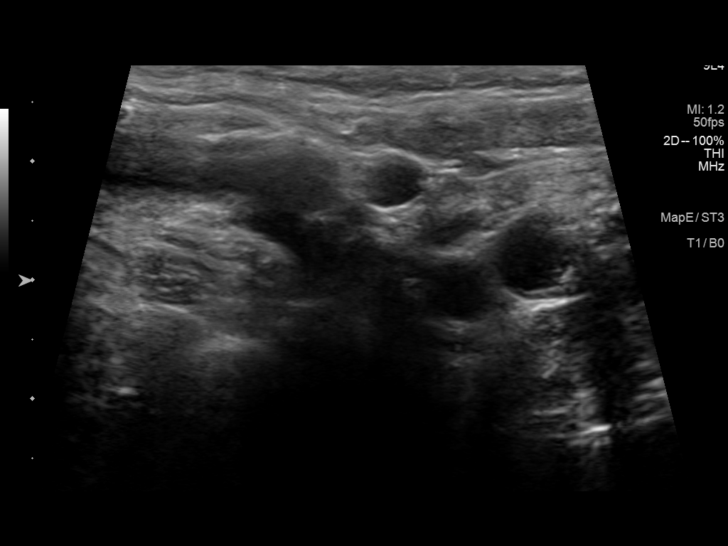
[im 71/71]
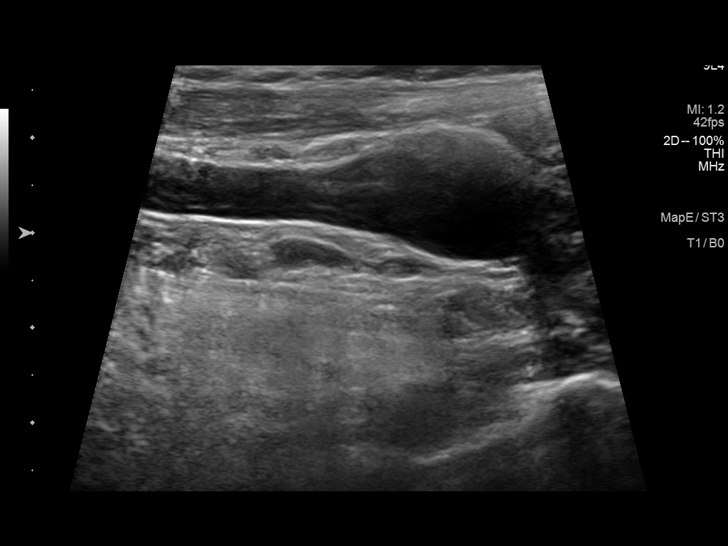

[13 of 25 positions shown; findings below may reference images not displayed]

FINDINGS: Parenchymal Echotexture: Moderately heterogenous

Isthmus: 4 mm

Right lobe: 6.1 x 2.3 x 1.8 cm

Left lobe: 6.1 x 2.4 x 2.8 cm

_________________________________________________________

Estimated total number of nodules >/= 1 cm: 5

Number of spongiform nodules >/=  2 cm not described below (TR1): 0

Number of mixed cystic and solid nodules >/= 1.5 cm not described
below (TR2): 0

_________________________________________________________

Nodule # 2:

Location: Right; Inferior

Maximum size: 1.3 cm; Other 2 dimensions: 1.1 x 1.3 cm

Composition: solid/almost completely solid (2)

Echogenicity: isoechoic (1)

Shape: not taller-than-wide (0)

Margins: ill-defined (0)

Echogenic foci: none (0)

ACR TI-RADS total points: 3.

ACR TI-RADS risk category: TR3 (3 points).

ACR TI-RADS recommendations:

Given size (<1.4 cm) and appearance, this nodule does NOT meet
TI-RADS criteria for biopsy or dedicated follow-up.

_________________________________________________________

Nodule # 3:

Location: Right; Inferior

Maximum size: 1.0 cm; Other 2 dimensions: 0.9 x 0.7 cm

Composition: solid/almost completely solid (2)

Echogenicity: hypoechoic (2)

Shape: not taller-than-wide (0)

Margins: smooth (0)

Echogenic foci: none (0)

ACR TI-RADS total points: 4.

ACR TI-RADS risk category: TR4 (4-6 points).

ACR TI-RADS recommendations:

*Given size (>/= 1 - 1.4 cm) and appearance, a follow-up ultrasound
in 1 year should be considered based on TI-RADS criteria.

_________________________________________________________

Nodule # 6:

Location: Left; Mid

Maximum size: 3.1 cm; Other 2 dimensions: 2.0 x 2.5 cm

Composition: solid/almost completely solid (2)

Echogenicity: isoechoic (1)

Shape: not taller-than-wide (0)

Margins: ill-defined (0)

Echogenic foci: none (0)

ACR TI-RADS total points: 3.

ACR TI-RADS risk category: TR3 (3 points).

ACR TI-RADS recommendations:

**Given size (>/= 2.5 cm) and appearance, fine needle aspiration of
this mildly suspicious nodule should be considered based on TI-RADS
criteria.

_________________________________________________________

There are additional bilateral solid isoechoic TR 3 type nodules all
measuring 1.3 cm or less in size. These also would not meet criteria
for any biopsy or follow-up and are not fully described by TI rads
criteria.

No hypervascularity.  No regional adenopathy.
IMPRESSION: Dominant 3.1 cm left mid thyroid TR 3 nodule meets criteria for
biopsy as above.

1 cm right inferior TR 4 nodule meets criteria follow-up in 1 year.

The above is in keeping with the ACR TI-RADS recommendations - [HOSPITAL] 0010;[DATE].

## 2022-04-18 ENCOUNTER — Other Ambulatory Visit: Payer: Self-pay | Admitting: Internal Medicine

## 2022-06-02 ENCOUNTER — Other Ambulatory Visit: Payer: No Typology Code available for payment source

## 2022-06-02 ENCOUNTER — Ambulatory Visit: Payer: No Typology Code available for payment source | Admitting: Internal Medicine

## 2022-06-02 ENCOUNTER — Encounter: Payer: Self-pay | Admitting: Internal Medicine

## 2022-06-02 VITALS — BP 124/70 | HR 64 | Ht 73.0 in | Wt 240.5 lb

## 2022-06-02 DIAGNOSIS — K641 Second degree hemorrhoids: Secondary | ICD-10-CM

## 2022-06-02 DIAGNOSIS — K529 Noninfective gastroenteritis and colitis, unspecified: Secondary | ICD-10-CM

## 2022-06-02 NOTE — Patient Instructions (Signed)
You have been given a testing kit to check for small intestine bacterial overgrowth (SIBO) which is completed by a company named [facility]. Make sure to return your test in the mail using the return mailing label given to you along with the kit. Your demographic and insurance information have already been sent to the company and they should be in contact with you over the next 1-2 weeks regarding this test. [facility] will collect an upfront charge of $99.74 for commercial insurance plans and $209.74 is you are paying cash. Make sure to discuss with [facility] PRIOR to having the test to see if they have gotten information from your insurance company as to how much your testing will cost out of pocket, if any. Please keep in mind that you will be getting a call from phone number [phone] or a similar number. If you do not hear from them within this time frame, please call our office at [phone] or call [facility] directly at [phone].   Your provider has requested that you go to the basement level for lab work before leaving today. Press "B" on the elevator. The lab is located at the first door on the left as you exit the elevator.  Due to recent changes in healthcare laws, you may see the results of your imaging and laboratory studies on MyChart before your provider has had a chance to review them.  We understand that in some cases there may be results that are confusing or concerning to you. Not all laboratory results come back in the same time frame and the provider may be waiting for multiple results in order to interpret others.  Please give [place] 48 hours in order for your provider to thoroughly review all the results before contacting the office for clarification of your results.   Try over the counter Imodium: you may take 1-2 daily.  _______________________________________________________  If your blood pressure at your visit was 140/90 or greater, please contact  your primary care physician to follow up on this.  _______________________________________________________  If you are age 32 or older, your body mass index should be between 23-30. Your Body mass index is 31.73 kg/m. If this is out of the aforementioned range listed, please consider follow up with your Primary Care Provider.  If you are age 52 or younger, your body mass index should be between 19-25. Your Body mass index is 31.73 kg/m. If this is out of the aformentioned range listed, please consider follow up with your Primary Care Provider.   ________________________________________________________  The  GI providers would like to encourage you to use [facility] to communicate with providers for non-urgent requests or questions.  Due to long hold times on the telephone, sending your provider a message by [facility] may be a faster and more efficient way to get a response.  Please allow 48 business hours for a response.  Please remember that this is for non-urgent requests.  _______________________________________________________   I appreciate the opportunity to care for you. Silvano Rusk, MD, [facility]

## 2022-06-02 NOTE — Progress Notes (Signed)
Aaron Stout 67 y.o. [birth year] [id number]  Assessment & Plan:   Encounter Diagnoses  Name Primary?   Chronic diarrhea Yes   Prolapsed internal hemorrhoids, grade 2     Going to try to control diarrhea better before retreating hemorrhoids with banding.  He may use 1-2 loperamide a day and continue his colestipol.  Further evaluation of his problems as below.  Further plans and follow-up pending those results.  Had considered this possibly from medication side effect, I think that is unlikely especially since he stopped the olmesartan and he is having his persistent issues.   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  I banded right posterior and left lateral internal hemorrhoids on 04/14/2022 and had an increase colestipol from 7.5 to 10 mg daily.  He still has soft loose peanut butter consistency stools, sometimes normal formed stool but mostly like this making it difficult to wipe and cleanse.  Quality of life is an issue.  Some slight rectal bleeding from the hemorrhoids.  They are similar to what they were prior to the banding in February.  He had had all 3 columns banded in 2022.  When he was last seen I wonder if olmesartan could be contributing to his diarrhea.  He is now off that and things are similar.  He has on bisoprolol now.  Allergies  Allergen Reactions   Duloxetine Hcl Other (See Comments)    drowsiness   Ibuprofen Hives   Voltaren [Diclofenac] Shortness Of Breath    Also had palpitations   Penicillins Other (See Comments)    Unknown Has patient had a PCN reaction causing immediate rash, facial/tongue/throat swelling, SOB or lightheadedness with hypotension: Unknown Has patient had a PCN reaction causing severe rash  involving mucus membranes or skin necrosis: Unknown Has patient had a PCN reaction that required hospitalization: Unknown Has patient had a PCN reaction occurring within the last 10 years: No If all of the above answers are "NO", then may proceed with Cephalosporin use.  Childhood response    Dilaudid [Hydromorphone Hcl] Nausea And Vomiting   Simvastatin Other (See Comments)    Cramps   Current Meds  Medication Sig   albuterol (PROVENTIL HFA;VENTOLIN HFA) 108 (90 Base) MCG/ACT inhaler Inhale 2 puffs into the lungs 2 (two) times daily. (Patient taking differently: Inhale 2 puffs into the lungs as needed.)   amLODipine (NORVASC) 10 MG tablet Take 1 tablet (10 mg total) by mouth daily.   amphetamine-dextroamphetamine (ADDERALL XR) 20 MG 24 hr capsule Take 1 capsule (20 mg total) by mouth 2 (two) times daily.   Ascorbic Acid (VITAMIN C) 1000 MG tablet Take 1,000 mg by mouth 2 (two) times daily.   azelastine (ASTELIN) 0.1 % nasal spray Place 1 spray into both nostrils as needed for rhinitis. Use in each nostril as directed   bisoprolol (ZEBETA) 5 MG tablet Take 0.5 tablets (2.5 mg total) by mouth daily.   Cholecalciferol (VITAMIN D) 2000 UNITS tablet Take 3,000 Units by mouth daily.    clotrimazole-betamethasone (LOTRISONE) cream Apply 1 application topically 2 (two) times daily as needed (for psoriasis).   Coenzyme Q10 (CO Q-10) 100 MG CAPS Take 100 mg by mouth daily.   colestipol (COLESTID) 5 g packet Take 10 g  by mouth daily.   Cyanocobalamin (VITAMIN B 12 PO) Take 5,000 mcg by mouth at bedtime.    DHEA 25 MG CAPS Take 25 mg by mouth daily.    escitalopram (LEXAPRO) 5 MG tablet Take 1.5 tablets (7.5 mg total) by mouth daily.   fexofenadine (ALLEGRA) 180 MG tablet Take 180 mg by mouth at bedtime.   fluticasone (FLONASE) 50 MCG/ACT nasal spray Place 1 spray into both nostrils as needed for allergies or rhinitis.   montelukast (SINGULAIR) 10 MG tablet Take 10 mg by mouth daily.   Multiple  Vitamin (MULTIVITAMIN) tablet Take 1 tablet by mouth daily.   nystatin cream (MYCOSTATIN) Apply 1 Application topically as needed.   Omega-3 Fatty Acids (FISH OIL) 1000 MG CAPS Take 2,000 mg by mouth 2 (two) times daily.    Omeprazole-Sodium Bicarbonate (ZEGERID) 20-1100 MG CAPS capsule Take 1 capsule by mouth 2 (two) times daily.    OVER THE COUNTER MEDICATION Take 100 mg by mouth daily. Pregnenolone supplement   Psyllium (FIBER) 0.52 G CAPS Take 4.16 g by mouth at bedtime.    QUEtiapine (SEROQUEL) 25 MG tablet Take 3 tablets (75 mg total) by mouth at bedtime.   Turmeric 500 MG CAPS Take 500 mg by mouth 2 (two) times daily.    valACYclovir (VALTREX) 500 MG tablet Take 500 mg by mouth 2 (two) times daily.    WIXELA INHUB 100-50 MCG/ACT AEPB Inhale 1 puff into the lungs 2 (two) times daily.   Past Medical History:  Diagnosis Date   Adenomatous colon polyp 2001   ALLERGIC RHINITIS 05/18/2007   Qualifier: Diagnosis of  By: Ronnald Ramp RN, [place], [place]     Anxiety    Arthritis 2013   arthritis-hands; Per PCPs notes-inflammatory arthritis   ASTHMA 05/18/2007   Qualifier: Diagnosis of  By: Ronnald Ramp RN, CGRN, Sheri     Bipolar 1 disorder ([place])    With depression   Cancer ([place])    hx skin cancer   Chest tightness    Chronic fatigue syndrome    Chronic headaches    d/t old neck injury   Complication of anesthesia    x1 episode "panic attack" awakening-none in recent years   Diverticulosis    Ectatic thoracic aorta ([place]) 07/2018   Follow-up CTA chest 07/27/2021: Ascending aortic dilation 4.2 cm.  No change.   GASTROESOPHAGEAL REFLUX DISEASE 05/18/2007   Qualifier: Diagnosis of  By: Ronnald Ramp RN, CGRN, Sheri     HEADACHE, CHRONIC 05/18/2007   Qualifier: Diagnosis of  By: Ronnald Ramp RN, CGRN, Sheri     Heart murmur    benign    History of degenerative disc disease    HOH (hard of hearing)    slight-bilateral   HSV-2 (herpes simplex virus 2) infection    Per PCP-genital   Hyperlipidemia 05/18/2007    Qualifier: Diagnosis of  By: Ronnald Ramp RN, CGRN, Sheri     Hyperlipidemia due to dietary fat intake    Hypertension    IBS (irritable bowel syndrome)    Incidental lung nodule, > 35mm and < 36mm 2019   Stable is a 2022   Internal hemorrhoids with other complication [id number]   Irritable bowel syndrome 05/18/2007   Qualifier: Diagnosis of  By: Ronnald Ramp RN, CGRN, Sheri     Lyme disease    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  40% proximal to mid LCx.   OSA (obstructive sleep apnea)  better s/p UPP-no problems now   Personal history of adenomatous colonic polyps 05/18/2007   2001, 2004, 2006, 2009 (last with one small adenoma) Max 4 polyps in past with largest 7 mm (both in 2004)  09/11/2013 7 small polyps adenomas  02/2017 5 adeoimas max 10 mm repeat colon 03/2020     PONV (postoperative nausea and vomiting)    Prostatitis    PROSTATITIS, CHRONIC 05/18/2007   Qualifier: Diagnosis of  By: Ronnald Ramp RN, CGRN, Sheri     Radiculopathy of cervical spine    Syncope 05/2008   1 episode-evaluated by cardiologist-negative findings-?related to low B/p (hydration tends to help).   Testicular hypofunction    Vitamin D deficiency    Past Surgical History:  Procedure Laterality Date   BACK SURGERY  1975, 1985   Cardiopulmonary Exercise Test  07/29/2021   Preexercise spirometry was normal.  Exercised on cycle ergometer 11:45 min  -> 132 Watts => Resting HR 73 - Peak 124 (81% MPHR), Peak VO2 90% predicted..  Normal functional capacity.  No indication of cardiopulmonary rotation.  When corrected for ideal body weight, results suggest that body habitus is the primary contributing to her dyspnea.  Mild chronotropic competence noted.   CHOLECYSTECTOMY N/A 10/08/2014   Procedure: LAPAROSCOPIC CHOLECYSTECTOMY WITH INTRAOPERATIVE CHOLANGIOGRAM;  Surgeon: Erroll Luna, MD;  Location: [facility];  Service: General;   Laterality: N/A;   COLONOSCOPY  02/20/2017   Broxton Broady- polyps   COLONOSCOPY  05/15/2020   Sanela Evola   COLONOSCOPY W/ BIOPSIES  multiple   CT CTA CORONARY W/CA SCORE W/CM &/OR WO/CM  08/23/2017   Coronary Calcium Score 719.  Mild eccentric calcified plaque in the Left Main.  Diffuse calcified plaque with moderate to severe plaque in the proximal to mid LAD along with diffuse 50 to 69% stenosis (cannot exclude greater than 70% stenosis in the mid LAD. => NO CT FFR 2/2 motion artifact.  Mild calcified plaque ost OM1 25 to 50%.  Mild diffuse plaque mid RCA 25 to 50%. Ascending aorta -39 mm. .   ESOPHAGOGASTRODUODENOSCOPY  multiple   GREEN LIGHT LASER TURP (TRANSURETHRAL RESECTION OF PROSTATE N/A 06/01/2012   Procedure: GREEN LIGHT LASER OF PROSTATE ;  Surgeon: Ailene Rud, MD;  Location: [facility];  Service: Urology;  Laterality: N/A;   HAND SURGERY  1971   tendon transplantation(injury)   HEMORRHOID BANDING     LEFT HEART CATH AND CORONARY ANGIOGRAPHY  05/2008   Minor luminal irregularity.  Normal LV function.  (Dr. Acie Fredrickson)   LEFT HEART CATH AND CORONARY ANGIOGRAPHY N/A 09/12/2017   Procedure: LEFT HEART CATH AND CORONARY ANGIOGRAPHY;  Surgeon: Leonie Man, MD;  Location: [facility];  Service: Cardiovascular; normal LV size and function.  EF 55 to 65%.  Normal LVEDP.  Extensive extraluminal calcification throughout the LAD but no notable stenosis in the LAD or diagonal branch.  Proximal LAD and mid LCx 40%.   MEDIAL PARTIAL KNEE REPLACEMENT Right    NASAL POLYP EXCISION     NM MYOVIEW LTD  04/2012   Mildly reduced EF.  Low risk.  No ischemia or infarction.   POLYPECTOMY     TENNIS ELBOW RELEASE/NIRSCHEL PROCEDURE Right    TONSILLECTOMY     TRANSTHORACIC ECHOCARDIOGRAM  07/28/2021   : Normal LV size and function.  EF 60 to 65%.  No RWMA.  GR 1 DD.  Normal RV with normal RVSP, and normal RAP.Marland Kitchen  Normal AOV and MV.  Ascending aorta measuring 40 mm.  UPPER GASTROINTESTINAL  ENDOSCOPY     UVULOPALATOPHARYNGOPLASTY  1990   Social History   Social History Narrative   Married father of 70 sons   30 year old son graduated [employer] & Lives at home with parents (bipolar)   16 year old son went to [employer] then [employer] as of May 2021, living at home       He has a MA in Engineer, site - & teaches [employer]   family history includes Alcoholism in an other family member; Bipolar disorder in his son; Bowel Disease (age of onset: 19) in his father; Breast cancer (age of onset: 12) in his mother; CAD (age of onset: 73) in his brother and brother; COPD in his maternal grandfather; Colon polyps in an other family member; Congestive Heart Failure in his maternal grandfather; Ehlers-Danlos syndrome in his son; Heart attack in his maternal grandfather; Heart attack (age of onset: 75) in his brother; Heart disease in an other family member; Heart failure (age of onset: 14) in his father; Hepatitis B in his brother; Migraines in his son.   Review of Systems As per HPI  Objective:   Physical Exam BP 124/70 (BP Location: Left Arm, Patient Position: Sitting, Cuff Size: Large)   Pulse 64   Ht 6\' 1"  (1.854 m)   Wt 240 lb 8 oz (109.1 kg)   BMI 31.73 kg/m  Digital rectal exam shows slight erythema at the anus.  No mass nontender rectal exam.  There is very soft to loose brownish stool.  Anoscopy demonstrates grade 2 right posterior and left lateral slightly inflamed internal hemorrhoid columns again and a grade 1 right anterior.

## 2022-06-06 ENCOUNTER — Other Ambulatory Visit: Payer: No Typology Code available for payment source

## 2022-06-06 DIAGNOSIS — I25118 Atherosclerotic heart disease of native coronary artery with other forms of angina pectoris: Secondary | ICD-10-CM

## 2022-06-06 DIAGNOSIS — I25119 Atherosclerotic heart disease of native coronary artery with unspecified angina pectoris: Secondary | ICD-10-CM | POA: Insufficient documentation

## 2022-06-06 DIAGNOSIS — K529 Noninfective gastroenteritis and colitis, unspecified: Secondary | ICD-10-CM

## 2022-06-06 HISTORY — DX: Atherosclerotic heart disease of native coronary artery with other forms of angina pectoris: I25.118

## 2022-06-08 LAB — 7ALPHAC4: 7AlphaC4: 141 ng/mL

## 2022-06-11 LAB — PANCREATIC ELASTASE, FECAL: Pancreatic Elastase-1, Stool: >500 mcg/g

## 2022-06-13 ENCOUNTER — Encounter: Payer: Self-pay | Admitting: Internal Medicine

## 2022-06-15 ENCOUNTER — Encounter: Payer: Self-pay | Admitting: Cardiology

## 2022-06-15 ENCOUNTER — Other Ambulatory Visit: Payer: Self-pay | Admitting: *Deleted

## 2022-06-15 ENCOUNTER — Ambulatory Visit: Payer: No Typology Code available for payment source | Attending: Cardiology | Admitting: Cardiology

## 2022-06-15 VITALS — BP 138/62 | HR 61 | Ht 73.0 in | Wt 239.6 lb

## 2022-06-15 DIAGNOSIS — E785 Hyperlipidemia, unspecified: Secondary | ICD-10-CM | POA: Diagnosis not present

## 2022-06-15 DIAGNOSIS — R931 Abnormal findings on diagnostic imaging of heart and coronary circulation: Secondary | ICD-10-CM | POA: Diagnosis not present

## 2022-06-15 DIAGNOSIS — I2510 Atherosclerotic heart disease of native coronary artery without angina pectoris: Secondary | ICD-10-CM

## 2022-06-15 DIAGNOSIS — R002 Palpitations: Secondary | ICD-10-CM

## 2022-06-15 DIAGNOSIS — R0609 Other forms of dyspnea: Secondary | ICD-10-CM

## 2022-06-15 DIAGNOSIS — I200 Unstable angina: Secondary | ICD-10-CM

## 2022-06-15 DIAGNOSIS — I10 Essential (primary) hypertension: Secondary | ICD-10-CM

## 2022-06-15 DIAGNOSIS — I25110 Atherosclerotic heart disease of native coronary artery with unstable angina pectoris: Secondary | ICD-10-CM | POA: Diagnosis not present

## 2022-06-15 DIAGNOSIS — G720 Drug-induced myopathy: Secondary | ICD-10-CM

## 2022-06-15 MED ORDER — BISOPROLOL FUMARATE 5 MG PO TABS: 2.5000 mg | ORAL_TABLET | Freq: Every day | ORAL | 3 refills | Status: DC

## 2022-06-15 MED ORDER — ASPIRIN 81 MG PO TBEC: 81.0000 mg | DELAYED_RELEASE_TABLET | Freq: Every day | ORAL | 0 refills | Status: AC

## 2022-06-15 NOTE — Progress Notes (Unsigned)
Primary Care Provider: Cleatis Polka., MD [facility] Cardiologist: Bryan Lemma, MD Electrophysiologist: None  Clinic Note: No chief complaint on file.   ===================================  ASSESSMENT/PLAN   Problem List Items Addressed This Visit       Cardiology Problems   Hyperlipidemia LDL goal <70 - Primary (Chronic)   Essential hypertension (Chronic)     Other   DOE (dyspnea on exertion) (Chronic)   Abnormal cardiac CT angiography (Chronic)    ===================================  HPI:    Aaron Stout is a 67 y.o. male with a PMH notable for CAD-chronic chest pain, HTN, HLD, dilated ascending aorta, palpitations complicated by bipolar disorder and anxiety who presents today for 51-month follow-up. Initially referred by Cleatis Polka., MD.  [name] was last seen on December 22 by Bernadene Person, NP: Noted "constant angina".  Stable, unchanged.  He had dizziness/intolerance of Ranexa that improved with stopping Ranexa.  Concerned about ongoing chest pain.  Has been intolerant of beta-blockers in the past because of chronotropic incompetence.  Was already on 10 mg amlodipine.  A-> started on low-dose Imdur and consider the possibility of ischemic evaluation.  Intolerant to statins, never started medication despite lipid clinic evaluation.  Referred back to lipid clinic.  CPX suggested mild chronotropic competence.  Therefore we suggested bisoprolol to 2.5 mg daily.    Recent Hospitalizations: None  Reviewed  CV studies:    The following studies were reviewed today: (if available, images/films reviewed: From Epic Chart or Care Everywhere) ***:  Interval History:   [name]   CV Review of Symptoms (Summary): {roscv:310661}  REVIEWED OF SYSTEMS   ROS  I have reviewed and (if needed) personally updated the patient's problem list, medications, allergies, past medical and surgical history, social and family history.   PAST  MEDICAL HISTORY   Past Medical History:  Diagnosis Date   Adenomatous colon polyp 2001   ALLERGIC RHINITIS 05/18/2007   Qualifier: Diagnosis of  By: Yetta Barre RN, CGRN, Sheri     Anxiety    Arthritis 2013   arthritis-hands; Per PCPs notes-inflammatory arthritis   ASTHMA 05/18/2007   Qualifier: Diagnosis of  By: Yetta Barre RN, CGRN, Sheri     Bipolar 1 disorder    With depression   Cancer    hx skin cancer   Chest tightness    Chronic fatigue syndrome    Chronic headaches    d/t old neck injury   Complication of anesthesia    x1 episode "panic attack" awakening-none in recent years   Diverticulosis    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  No change.   GASTROESOPHAGEAL REFLUX DISEASE 05/18/2007   Qualifier: Diagnosis of  By: Yetta Barre RN, CGRN, Sheri     HEADACHE, CHRONIC 05/18/2007   Qualifier: Diagnosis of  By: Yetta Barre RN, CGRN, Sheri     Heart murmur    benign    History of degenerative disc disease    HOH (hard of hearing)    slight-bilateral   HSV-2 (herpes simplex virus 2) infection    Per PCP-genital   Hyperlipidemia 05/18/2007   Qualifier: Diagnosis of  By: Yetta Barre RN, CGRN, Sheri     Hyperlipidemia due to dietary fat intake    Hypertension    IBS (irritable bowel syndrome)    Incidental lung nodule, > 60mm and < 35mm 2019   Stable is a 2022   Internal hemorrhoids with other  complication 09/11/2013   Irritable bowel syndrome 05/18/2007   Qualifier: Diagnosis of  By: Yetta Barre RN, CGRN, Sheri     Lyme disease    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  40% proximal to mid LCx.   OSA (obstructive sleep apnea)    better s/p UPP-no problems now   Personal history of adenomatous colonic polyps 05/18/2007   2001, 2004, 2006, 2009 (last with one small adenoma) Max 4 polyps in past with largest 7 mm (both in 2004)   09/11/2013 7 small polyps adenomas  02/2017 5 adeoimas max 10 mm repeat colon 03/2020     PONV (postoperative nausea and vomiting)    Prostatitis    PROSTATITIS, CHRONIC 05/18/2007   Qualifier: Diagnosis of  By: Yetta Barre RN, CGRN, Sheri     Radiculopathy of cervical spine    Syncope 05/2008   1 episode-evaluated by cardiologist-negative findings-?related to low B/p (hydration tends to help).   Testicular hypofunction    Vitamin D deficiency     PAST SURGICAL HISTORY   Past Surgical History:  Procedure Laterality Date   BACK SURGERY  1975, 1985   Cardiopulmonary Exercise Test  07/29/2021   Preexercise spirometry was normal.  Exercised on cycle ergometer 11:45 min  -> 132 Watts => Resting HR 73 - Peak 124 (81% MPHR), Peak VO2 90% predicted..  Normal functional capacity.  No indication of cardiopulmonary rotation.  When corrected for ideal body weight, results suggest that body habitus is the primary contributing to her dyspnea.  Mild chronotropic competence noted.   CHOLECYSTECTOMY N/A 10/08/2014   Procedure: LAPAROSCOPIC CHOLECYSTECTOMY WITH INTRAOPERATIVE CHOLANGIOGRAM;  Surgeon: Harriette Bouillon, MD;  Location: [facility] OR;  Service: General;  Laterality: N/A;   COLONOSCOPY  02/20/2017   Gessner- polyps   COLONOSCOPY  05/15/2020   Gessner   COLONOSCOPY W/ BIOPSIES  multiple   CT CTA CORONARY W/CA SCORE W/CM &/OR WO/CM  08/23/2017   Coronary Calcium Score 719.  Mild eccentric calcified plaque in the Left Main.  Diffuse calcified plaque with moderate to severe plaque in the proximal to mid LAD along with diffuse 50 to 69% stenosis (cannot exclude greater than 70% stenosis in the mid LAD. => NO CT FFR 2/2 motion artifact.  Mild calcified plaque ost OM1 25 to 50%.  Mild diffuse plaque mid RCA 25 to 50%. Ascending aorta -39 mm. .   ESOPHAGOGASTRODUODENOSCOPY  multiple   GREEN LIGHT LASER TURP (TRANSURETHRAL RESECTION OF PROSTATE N/A 06/01/2012   Procedure: GREEN LIGHT LASER OF PROSTATE ;  Surgeon:  Kathi Ludwig, MD;  Location: [facility] ORS;  Service: Urology;  Laterality: N/A;   HAND SURGERY  1971   tendon transplantation(injury)   HEMORRHOID BANDING     LEFT HEART CATH AND CORONARY ANGIOGRAPHY  05/2008   Minor luminal irregularity.  Normal LV function.  (Dr. Elease Hashimoto)   LEFT HEART CATH AND CORONARY ANGIOGRAPHY N/A 09/12/2017   Procedure: LEFT HEART CATH AND CORONARY ANGIOGRAPHY;  Surgeon: [name], MD;  Location: [facility] INVASIVE CV LAB;  Service: Cardiovascular; normal LV size and function.  EF 55 to 65%.  Normal LVEDP.  Extensive extraluminal calcification throughout the LAD but no notable stenosis in the LAD or diagonal branch.  Proximal LAD and mid LCx 40%.   MEDIAL PARTIAL KNEE REPLACEMENT Right    NASAL POLYP EXCISION     NM MYOVIEW LTD  04/2012   Mildly reduced EF.  Low risk.  No ischemia or infarction.   POLYPECTOMY     TENNIS ELBOW RELEASE/NIRSCHEL PROCEDURE Right    TONSILLECTOMY     TRANSTHORACIC ECHOCARDIOGRAM  07/28/2021   : Normal LV size and function.  EF 60 to 65%.  No RWMA.  GR 1 DD.  Normal RV with normal RVSP, and normal RAP.Marland Kitchen.  Normal AOV and MV.  Ascending aorta measuring 40 mm.   UPPER GASTROINTESTINAL ENDOSCOPY     UVULOPALATOPHARYNGOPLASTY  1990   CPX July 2023: Mild chronotropic competence.  No indication of cardiopulmonary limitation.   MEDICATIONS/ALLERGIES   Current Meds  Medication Sig   albuterol (PROVENTIL HFA;VENTOLIN HFA) 108 (90 Base) MCG/ACT inhaler Inhale 2 puffs into the lungs 2 (two) times daily. (Patient taking differently: Inhale 2 puffs into the lungs as needed.)   amphetamine-dextroamphetamine (ADDERALL XR) 20 MG 24 hr capsule Take 1 capsule (20 mg total) by mouth 2 (two) times daily.   azelastine (ASTELIN) 0.1 % nasal spray Place 1 spray into both nostrils as needed for rhinitis. Use in each nostril as directed   bisoprolol (ZEBETA) 5 MG tablet Take 0.5 tablets (2.5 mg total) by mouth daily.   Cholecalciferol (VITAMIN D) 2000 UNITS  tablet Take 3,000 Units by mouth daily.    clotrimazole-betamethasone (LOTRISONE) cream Apply 1 application topically 2 (two) times daily as needed (for psoriasis).   Coenzyme Q10 (CO Q-10) 100 MG CAPS Take 100 mg by mouth daily.   colestipol (COLESTID) 5 g packet Take 10 g by mouth daily.   Cyanocobalamin (VITAMIN B 12 PO) Take 5,000 mcg by mouth at bedtime.    DHEA 25 MG CAPS Take 25 mg by mouth daily.    escitalopram (LEXAPRO) 5 MG tablet Take 1.5 tablets (7.5 mg total) by mouth daily.   fexofenadine (ALLEGRA) 180 MG tablet Take 180 mg by mouth at bedtime.   fluticasone (FLONASE) 50 MCG/ACT nasal spray Place 1 spray into both nostrils as needed for allergies or rhinitis.   montelukast (SINGULAIR) 10 MG tablet Take 10 mg by mouth daily.   Multiple Vitamin (MULTIVITAMIN) tablet Take 1 tablet by mouth daily.   nystatin cream (MYCOSTATIN) Apply 1 Application topically as needed.   Omega-3 Fatty Acids (FISH OIL) 1000 MG CAPS Take 2,000 mg by mouth 2 (two) times daily.    Omeprazole-Sodium Bicarbonate (ZEGERID) 20-1100 MG CAPS capsule Take 1 capsule by mouth 2 (two) times daily.    OVER THE COUNTER MEDICATION Take 100 mg by mouth daily. Pregnenolone supplement   Pregnenolone Micronized (PREGNENOLONE PO) Take by mouth.   Psyllium (FIBER) 0.52 G CAPS Take 4.16 g by mouth at bedtime.    QUEtiapine (SEROQUEL) 25 MG tablet Take 3 tablets (75 mg total) by mouth at bedtime.   Turmeric 500 MG CAPS Take 500 mg by mouth 2 (two) times daily.    valACYclovir (VALTREX) 500 MG tablet Take 500 mg by mouth 2 (two) times daily.    WIXELA INHUB 100-50 MCG/ACT AEPB Inhale 1 puff into the lungs 2 (two) times daily.   []  amLODipine (NORVASC) 10 MG tablet Take 1 tablet (10 mg total) by mouth daily.   [DISCONTINUED] amphetamine-dextroamphetamine (ADDERALL XR) 20 MG 24 hr capsule Take 1 capsule (20 mg total) by mouth 2 (two) times daily.   [DISCONTINUED] Ascorbic Acid (VITAMIN C) 1000 MG tablet Take 1,000 mg by mouth 2  (two) times daily.    Allergies  Allergen Reactions   Duloxetine Hcl Other (See Comments)    drowsiness  Ibuprofen Hives   Voltaren [Diclofenac] Shortness Of Breath    Also had palpitations   Penicillins Other (See Comments)    Unknown Has patient had a PCN reaction causing immediate rash, facial/tongue/throat swelling, SOB or lightheadedness with hypotension: Unknown Has patient had a PCN reaction causing severe rash involving mucus membranes or skin necrosis: Unknown Has patient had a PCN reaction that required hospitalization: Unknown Has patient had a PCN reaction occurring within the last 10 years: No If all of the above answers are "NO", then may proceed with Cephalosporin use.  Childhood response    Dilaudid [Hydromorphone Hcl] Nausea And Vomiting   Simvastatin Other (See Comments)    Cramps    SOCIAL HISTORY/FAMILY HISTORY   Reviewed in Epic:  Pertinent findings:  Social History   Tobacco Use   Smoking status: Former    Packs/day: 0.50    Years: 7.00    Additional pack years: 0.00    Total pack years: 3.50    Types: Cigarettes    Start date: 03/07/1977    Quit date: 07/06/1980    Years since quitting: 41.9   Smokeless tobacco: Never  Vaping Use   Vaping Use: Never used  Substance Use Topics   Alcohol use: No   Drug use: No   Social History   Social History Narrative   Married father of 34 sons   27 year old son graduated Therapist, sports & Lives at home with parents (bipolar)   56 year old son went to [employer] then [employer] as of May 2021, living at home       He has a MA in Psychologist, educational - & teaches Art @ [employer]    OBJCTIVE -PE, EKG, labs   Wt Readings from Last 3 Encounters:  06/15/22 239 lb 9.6 oz (108.7 kg)  06/02/22 240 lb 8 oz (109.1 kg)  04/14/22 238 lb (108 kg)    Physical Exam: BP 138/62 (BP Location: Left Arm, Patient Position: Sitting, Cuff Size: Large)   Pulse 61   Ht 6\' 1"  (1.854 m)   Wt 239 lb 9.6 oz (108.7 kg)   SpO2 97%    BMI 31.61 kg/m  Physical Exam   Adult ECG Report  Rate: *** ;  Rhythm: {rhythm:17366};   Narrative Interpretation: ***   Recent Labs:  ***  Lab Results  Component Value Date   CHOL 166 12/02/2021   HDL 41 12/02/2021   LDLCALC 100 (H) 12/02/2021   LDLDIRECT 180.6 07/18/2011   TRIG 142 12/02/2021   CHOLHDL 4.0 12/02/2021   Lab Results  Component Value Date   CREATININE 1.05 07/27/2021   BUN 15 07/27/2021   NA 139 07/27/2021   K 4.3 07/27/2021   CL 102 07/27/2021   CO2 23 07/27/2021      Latest Ref Rng & Units 09/12/2017    5:56 AM 09/11/2017    2:25 PM 05/26/2017    3:40 PM  CBC  WBC 4.0 - 10.5 K/uL 8.7  7.6  8.1   Hemoglobin 13.0 - 17.0 g/dL 45.4  09.8  11.9   Hematocrit 39.0 - 52.0 % 43.7  43.6  40.5   Platelets 150 - 400 K/uL 209  223  214     No results found for: "HGBA1C" Lab Results  Component Value Date   TSH 0.63 04/05/2012    ================================================== I spent a total of ***minutes with the patient spent in direct patient consultation.  Additional time spent with chart review  / charting (studies, outside notes,  etc): *** min Total Time: *** min  Current medicines are reviewed at length with the patient today.  (+/- concerns) ***  Notice: This dictation was prepared with Dragon dictation along with smart phrase technology. Any transcriptional errors that result from this process are unintentional and may not be corrected upon review.  Studies Ordered:   No orders of the defined types were placed in this encounter.  No orders of the defined types were placed in this encounter.  Shared Decision Making/Informed Consent: The risks [stroke (1 in 1000), death (1 in 1000), kidney failure [usually temporary] (1 in 500), bleeding (1 in 200), allergic reaction [possibly serious] (1 in 200)], benefits (diagnostic support and management of coronary artery disease) and alternatives of a cardiac catheterization were discussed in detail with Mr.  Rozo and he is willing to proceed.    Patient Instructions / Medication Changes & Studies & Tests Ordered   There are no Patient Instructions on file for this visit.     [name], MD, MS Bryan Lemma, M.D., M.S. Interventional Cardiologist  [facility]  Pager # [phone] Phone # [phone] [address] [place], [place] [address]   Thank you for choosing [facility] [place]!!

## 2022-06-15 NOTE — H&P (View-Only) (Signed)
Primary Care Provider: Cleatis Polka., MD  HeartCare Cardiologist: Bryan Lemma, MD Electrophysiologist: None  Clinic Note: Chief Complaint  Patient presents with   Follow-up    Still noting chest pain with exertion.   Coronary Artery Disease    Abnormal Coronary CTA not confirmed by catheterization.  However he continues to have symptoms.  Intolerant of medications.    ===================================  ASSESSMENT/PLAN   Problem List Items Addressed This Visit       Cardiology Problems   Progressive angina - Primary (Chronic)    Ongoing symptoms even though he did relatively well on CPX last year.  He is now saying he has resting discomfort.  Is possible that he had progression of disease from 2019, and at this point I think the only reasonable has left is a definitive test with cardiac catheterization.  This will allow [place] to do either FFR or even CFR to identify either focal lesions or global ischemia/microvascular disease.  Intolerant of Ranexa and Imdur but has been tolerating amlodipine along with the low-dose beta-blocker (bisoprolol 2.5 mg) currently.  Plan: Will schedule cardiac catheterization June 24, 2022-see shared decision making/informed consent below. I will have him get lipid panel checked along with his preop labs later this week. Will continue low-dose bisoprolol 2.5 mg daily-refill.  For antianginal and CAD. Continue amlodipine for antianginal benefit as well Again, he was not taking aspirin despite recommendations.  Certainly if he finds on the calf then he needs to take aspirin and possibly even Thienopyridine. It appears that he is no longer on ARB.  That would be 1 potential option.  Anginal benefit will be to add back afterload reduction. Statin intolerant.  Currently taking co-Q10 and fish oil. => Check lipid panel along with preop labs for And plan to refer back to CVRR assistance with the management.  He seems to be reasonable and  considering inclisiran probably more so to PCSK9 inhibitor.  I explained to him that he needs to "follow-up with CVRR as recommended in order to get this process going." He says he was intolerant of Ranexa which is unfortunate based on the concern for microvascular disease.  He did not tolerate Imdur either.        Relevant Medications   bisoprolol (ZEBETA) 5 MG tablet   aspirin EC 81 MG tablet   Hyperlipidemia LDL goal <70 (Chronic)    Documented CAD, but has statin myopathy.  Now only on co-Q10 and fish oil.  Checked with a panel with precath labs and refer back to CVRR lipid clinic to discuss inclisiran versus PCSK9 inhibitor.      Relevant Medications   bisoprolol (ZEBETA) 5 MG tablet   aspirin EC 81 MG tablet   Other Relevant Orders   Lipid panel   Comprehensive metabolic panel   Essential hypertension (Chronic)    My understanding was that he was on telmisartan but is not listed.  Neither his amlodipine.  Currently he tells me he is taking amlodipine 10 mg along with bisoprolol 2.5 mg daily.  Did not do well diuretic.  Did not do well with Imdur.  Neck step would be to add back ARB unless there is particular reason why he did not tolerate.  This will allow for afterload reduction and potentially help with microvascular perfusion.      Relevant Medications   bisoprolol (ZEBETA) 5 MG tablet   aspirin EC 81 MG tablet   Coronary artery disease, non-occlusive (Chronic)  Coronary CTA suggested FFR positive lesions not seen on cardiac cath.  However now he continues to have ongoing symptoms.  At this point, I do not know that any other testing besides relook catheterization with the possibility of FFR and CFR (coronary flow reserve) be considered  We tried multiple different medications and he still having symptoms. Continue amlodipine and low-dose bisoprolol Refer back to CVRR lipid management Please at least be on aspirin, will defer to post-cath findings. Okay to continue  fish oil and co-Q10.       Relevant Medications   bisoprolol (ZEBETA) 5 MG tablet   aspirin EC 81 MG tablet     Other   Statin myopathy (Chronic)    He has simvastatin listed as an allergy, but he tells me he has tried lovastatin, atorvastatin and rosuvastatin.  Intolerant of all of them because of myalgias and fatigue.  With continued symptoms concerning for possible angina and whether it be macrovascular or microvascular, we need to shoot for LDL less than 70 if not less than 55.  We will reassess lipid panel with part of his precath labs and then refer to CVRR per yet again consideration of inclisiran versus PCSK9 inhibitor.  I think inclisiran would be a good option for him because this would be visualized witnessed therapy and minimal occasions for potential side effect.      Palpitation (Chronic)    Pretty well-controlled on bisoprolol.  Refill 2.5 mg daily.      DOE (dyspnea on exertion) (Chronic)   Relevant Orders   EKG 12-Lead   CBC   Abnormal cardiac CT angiography (Chronic)    He had positive CT FFR but no relief seen on Coronary CTA.  Neck step will be to evaluate with RFR/FFR and even CFR via cardiac catheterization.  We have talked about Myoview or stress PET, but at this point I think we need to go definitive.      Relevant Orders   EKG 12-Lead   Comprehensive metabolic panel   CBC    ===================================  HPI:    Aaron Stout is a 67 y.o. male with a PMH notable for CAD-chronic chest pain, HTN, HLD, dilated ascending aorta, palpitations complicated by bipolar disorder and anxiety who presents today for 57-month follow-up. Initially referred by Cleatis Polka., MD.  [name] was last seen on December 22 by Bernadene Person, NP: Noted "constant angina".  Stable, unchanged.  He had dizziness/intolerance of Ranexa that improved with stopping Ranexa.  Concerned about ongoing chest pain.  Has been intolerant of beta-blockers in the past  because of chronotropic incompetence.  Was already on 10 mg amlodipine.  A-> started on low-dose Imdur and consider the possibility of ischemic evaluation.  Intolerant to statins, never started medication despite lipid clinic evaluation.  Referred back to lipid clinic.  CPX suggested mild chronotropic competence.  Therefore we suggested bisoprolol to 2.5 mg daily.    Recent Hospitalizations: None  Reviewed  CV studies:    The following studies were reviewed today: (if available, images/films reviewed: From Epic Chart or Care Everywhere) None:  Interval History:   BRALYNN DONADO returns here today for follow-up evaluation indicated that he is really intolerant of just on any medication.  He did not tolerate Ranexa stating dizziness.  He did not tolerate Imdur because of dizziness and headache.  He is taking amlodipine although was not initially noted on his chart.  He does seem to be tolerating bisoprolol, but sometimes  feel some dizziness with it.  He continues to note pretty much chronic "angina "chest pain that is usually exertional but sometimes happens at rest.  It is very much disturbing to him limiting him to expel anything.  He says the pain can happen while he is at rest trying to sleep at night, but is usually exertional.  He says that they are occurring more frequently it with more intensity.  These episodes are often associated with a little bit of dyspnea.  He has no dyspnea at rest or.  He denies any heart failure symptoms of PND, orthopnea or edema.  No claudication symptoms.  Some palpitations but no rapid irregular heartbeats.  Actually because of the palpitations did decrease with the bisoprolol. No syncope or near syncope, TIA/amaurosis fugax or claudication.  We talked about lipid management.  He just says he had very bad effects from multiple statins in the past and was just not willing to try anything for cholesterol.  He did not really follow-up with the plan.  I explained  to him that really if were not able to treat his underlying cause of the CAD, treating CAD is pointless.  REVIEWED OF SYSTEMS   Review of Systems  Constitutional:  Positive for malaise/fatigue (Exercise intolerance more than fatigue.). Negative for weight loss.  HENT:  Negative for congestion.   Respiratory:  Positive for shortness of breath (Per HPI). Negative for cough.   Cardiovascular:        Per HPI  Gastrointestinal:  Negative for blood in stool, constipation and melena.  Genitourinary:  Negative for hematuria.  Musculoskeletal:  Negative for falls, joint pain (Nothing more than aches and pains.) and myalgias.  Neurological:  Positive for headaches. Negative for dizziness and focal weakness.  Psychiatric/Behavioral:  Negative for depression (Just getting a little frustrated) and memory loss. The patient is nervous/anxious and has insomnia.     I have reviewed and (if needed) personally updated the patient's problem list, medications, allergies, past medical and surgical history, social and family history.   PAST MEDICAL HISTORY   Past Medical History:  Diagnosis Date   Adenomatous colon polyp 2001   ALLERGIC RHINITIS 05/18/2007   Qualifier: Diagnosis of  By: Yetta Barre RN, CGRN, Sheri     Anxiety    Arthritis 2013   arthritis-hands; Per PCPs notes-inflammatory arthritis   ASTHMA 05/18/2007   Qualifier: Diagnosis of  By: Yetta Barre RN, CGRN, Sheri     Bipolar 1 disorder    With depression   Cancer    hx skin cancer   Chest tightness    Chronic fatigue syndrome    Chronic headaches    d/t old neck injury   Complication of anesthesia    x1 episode "panic attack" awakening-none in recent years   Diverticulosis    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  No change.   GASTROESOPHAGEAL REFLUX DISEASE 05/18/2007   Qualifier: Diagnosis of  By: Yetta Barre RN, CGRN, Sheri     HEADACHE, CHRONIC 05/18/2007   Qualifier: Diagnosis of   By: Yetta Barre RN, CGRN, Sheri     Heart murmur    benign    History of degenerative disc disease    HOH (hard of hearing)    slight-bilateral   HSV-2 (herpes simplex virus 2) infection    Per PCP-genital   Hyperlipidemia 05/18/2007   Qualifier: Diagnosis of  By: Yetta Barre RN, CGRN, Sheri     Hyperlipidemia due to dietary  fat intake    Hypertension    IBS (irritable bowel syndrome)    Incidental lung nodule, > 3mm and < 8mm 2019   Stable is a 2022   Internal hemorrhoids with other complication 09/11/2013   Irritable bowel syndrome 05/18/2007   Qualifier: Diagnosis of  By: Yetta Barre RN, CGRN, Sheri     Lyme disease    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  40% proximal to mid LCx.   OSA (obstructive sleep apnea)    better s/p UPP-no problems now   Personal history of adenomatous colonic polyps 05/18/2007   2001, 2004, 2006, 2009 (last with one small adenoma) Max 4 polyps in past with largest 7 mm (both in 2004)  09/11/2013 7 small polyps adenomas  02/2017 5 adeoimas max 10 mm repeat colon 03/2020     PONV (postoperative nausea and vomiting)    Prostatitis    PROSTATITIS, CHRONIC 05/18/2007   Qualifier: Diagnosis of  By: Yetta Barre RN, CGRN, Sheri     Radiculopathy of cervical spine    Syncope 05/2008   1 episode-evaluated by cardiologist-negative findings-?related to low B/p (hydration tends to help).   Testicular hypofunction    Vitamin D deficiency     PAST SURGICAL HISTORY   Past Surgical History:  Procedure Laterality Date   BACK SURGERY  1975, 1985   Cardiopulmonary Exercise Test  07/29/2021   Preexercise spirometry was normal.  Exercised on cycle ergometer 11:45 min  -> 132 Watts => Resting HR 73 - Peak 124 (81% MPHR), Peak VO2 90% predicted..  Normal functional capacity.  No indication of cardiopulmonary rotation.  When corrected for ideal body weight, results suggest that body  habitus is the primary contributing to her dyspnea.  Mild chronotropic competence noted.   CHOLECYSTECTOMY N/A 10/08/2014   Procedure: LAPAROSCOPIC CHOLECYSTECTOMY WITH INTRAOPERATIVE CHOLANGIOGRAM;  Surgeon: Harriette Bouillon, MD;  Location: [facility] OR;  Service: General;  Laterality: N/A;   COLONOSCOPY  02/20/2017   Gessner- polyps   COLONOSCOPY  05/15/2020   Gessner   COLONOSCOPY W/ BIOPSIES  multiple   CT CTA CORONARY W/CA SCORE W/CM &/OR WO/CM  08/23/2017   Coronary Calcium Score 719.  Mild eccentric calcified plaque in the Left Main.  Diffuse calcified plaque with moderate to severe plaque in the proximal to mid LAD along with diffuse 50 to 69% stenosis (cannot exclude greater than 70% stenosis in the mid LAD. => NO CT FFR 2/2 motion artifact.  Mild calcified plaque ost OM1 25 to 50%.  Mild diffuse plaque mid RCA 25 to 50%. Ascending aorta -39 mm. .   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  Normal LV function.  (Dr. Elease Hashimoto)   LEFT HEART CATH AND CORONARY ANGIOGRAPHY N/A 09/12/2017   Procedure: LEFT HEART CATH AND CORONARY ANGIOGRAPHY;  Surgeon: [name], MD;  Location: [facility] INVASIVE CV LAB;  Service: Cardiovascular; normal LV size and function.  EF 55 to 65%.  Normal LVEDP.  Extensive extraluminal calcification throughout the LAD but no notable stenosis in the LAD or diagonal  branch.  Proximal LAD and mid LCx 40%.   MEDIAL PARTIAL KNEE REPLACEMENT Right    NASAL POLYP EXCISION     NM MYOVIEW LTD  04/2012   Mildly reduced EF.  Low risk.  No ischemia or infarction.   POLYPECTOMY     TENNIS ELBOW RELEASE/NIRSCHEL PROCEDURE  Right    TONSILLECTOMY     TRANSTHORACIC ECHOCARDIOGRAM  07/28/2021   : Normal LV size and function.  EF 60 to 65%.  No RWMA.  GR 1 DD.  Normal RV with normal RVSP, and normal RAP.Marland Kitchen  Normal AOV and MV.  Ascending aorta measuring 40 mm.   UPPER GASTROINTESTINAL ENDOSCOPY     UVULOPALATOPHARYNGOPLASTY  1990   CPX July 2023: Mild chronotropic competence.  No indication of cardiopulmonary limitation.  Cardiac Cath 09/12/2017: Normal LV function EF 55-65 %.  No RWMA.  Normal LVEDP.  40% mid LCx otherwise minimal disease/external calcification noted. ==> consider Microvascular Dz:  Placed on aspirin, Toprol 50 mg and amlodipine 5 mg.   MEDICATIONS/ALLERGIES   Current Meds  Medication Sig   albuterol (PROVENTIL HFA;VENTOLIN HFA) 108 (90 Base) MCG/ACT inhaler Inhale 2 puffs into the lungs 2 (two) times daily. (Patient taking differently: Inhale 2 puffs into the lungs PRN    amphetamine-dextroamphetamine (ADDERALL XR) 20 MG 24 hr capsule Take 1 capsule (20 mg total) by mouth 2 (two) times daily.   azelastine (ASTELIN) 0.1 % nasal spray Place 1 spray into both nostrils as needed for rhinitis. Use in each nostril as directed   bisoprolol (ZEBETA) 5 MG tablet Take 0.5 tablets (2.5 mg total) by mouth daily.   Cholecalciferol (VITAMIN D) 2000 UNITS tablet Take 3,000 Units by mouth daily.    clotrimazole-betamethasone (LOTRISONE) cream Apply 1 application topically 2 (two) times daily as needed (for psoriasis).   Coenzyme Q10 (CO Q-10) 100 MG CAPS Take 100 mg by mouth daily.   colestipol (COLESTID) 5 g packet Take 10 g by mouth daily.   Cyanocobalamin (VITAMIN B 12 PO) Take 5,000 mcg by mouth at bedtime.    DHEA 25 MG CAPS Take 25 mg by mouth daily.    escitalopram (LEXAPRO) 5 MG tablet Take 1.5 tablets (7.5 mg total) by mouth daily.   fexofenadine (ALLEGRA) 180 MG tablet Take 180 mg by mouth at bedtime.   fluticasone (FLONASE) 50 MCG/ACT nasal spray Place 1 spray into both nostrils as needed for  allergies or rhinitis.   montelukast (SINGULAIR) 10 MG tablet Take 10 mg by mouth daily.   Multiple Vitamin (MULTIVITAMIN) tablet Take 1 tablet by mouth daily.   nystatin cream (MYCOSTATIN) Apply 1 Application topically as needed.   Omega-3 Fatty Acids (FISH OIL) 1000 MG CAPS Take 2,000 mg by mouth 2 (two) times daily.    Omeprazole-Sodium Bicarbonate (ZEGERID) 20-1100 MG CAPS capsule Take 1 capsule by mouth 2 (two) times daily.    OVER THE COUNTER MEDICATION Take 100 mg by mouth daily. Pregnenolone supplement   Pregnenolone Micronized (PREGNENOLONE PO) Take by mouth.   Psyllium (FIBER) 0.52 G CAPS Take 4.16 g by mouth at bedtime.    QUEtiapine (SEROQUEL) 25 MG tablet Take 3 tablets (75 mg total) by mouth at bedtime.   Turmeric 500 MG CAPS Take 500 mg by mouth 2 (two) times daily.    valACYclovir (VALTREX) 500 MG tablet Take 500 mg by mouth 2 (two) times daily.    WIXELA INHUB 100-50 MCG/ACT AEPB Inhale 1 puff into the lungs 2 (two) times daily.     amLODipine (NORVASC) 10 MG tablet Take 1 tablet (10 mg total) by mouth daily.   [DISCONTINUED] Ascorbic Acid (VITAMIN C) 1000 MG tablet Take 1,000 mg by mouth 2 (two) times daily.    Allergies  Allergen Reactions   Duloxetine Hcl Other (See Comments)    drowsiness   Ibuprofen Hives   Voltaren [Diclofenac] Shortness Of Breath    Also had palpitations   Penicillins Other (See Comments)    Unknown Has patient had a PCN reaction causing immediate rash, facial/tongue/throat swelling, SOB or lightheadedness with hypotension: Unknown Has patient had a PCN reaction causing severe rash involving mucus membranes or skin necrosis: Unknown Has patient had a PCN reaction that required hospitalization: Unknown Has patient had a PCN reaction occurring within the last 10 years: No If all of the above answers are "NO", then may proceed with Cephalosporin use.  Childhood response    Dilaudid [Hydromorphone Hcl] Nausea And Vomiting   Simvastatin Other  (See Comments)    Cramps    SOCIAL HISTORY/FAMILY HISTORY   Reviewed in Epic:  Pertinent findings:  Social History   Tobacco Use   Smoking status: Former    Packs/day: 0.50    Years: 7.00    Additional pack years: 0.00    Total pack years: 3.50    Types: Cigarettes    Start date: 03/07/1977    Quit date: 07/06/1980    Years since quitting: 41.9   Smokeless tobacco: Never  Vaping Use   Vaping Use: Never used  Substance Use Topics   Alcohol use: No   Drug use: No   Social History   Social History Narrative   Married father of 18 sons   39 year old son graduated Therapist, sports & Lives at home with parents (bipolar)   47 year old son went to [employer] then [employer] as of May 2021, living at home       He has a MA in Psychologist, educational - & teaches Art @ [employer]    OBJCTIVE -PE, EKG, labs   Wt Readings from Last 3 Encounters:  06/15/22 239 lb 9.6 oz (108.7 kg)  06/02/22 240 lb 8 oz (109.1 kg)  04/14/22 238 lb (108 kg)    Physical Exam: BP 138/62 (BP Location: Left Arm, Patient Position: Sitting, Cuff Size: Large)   Pulse 61   Ht 6\' 1"  (1.854 m)   Wt 239 lb 9.6 oz (108.7 kg)   SpO2 97%   BMI 31.61 kg/m  Physical Exam Vitals reviewed.  Constitutional:      General: He is not in acute distress.    Appearance: Normal appearance. He is obese. He is not ill-appearing or toxic-appearing.  HENT:     Head: Normocephalic and atraumatic.  Eyes:     Extraocular Movements: Extraocular movements intact.     Pupils: Pupils are equal, round, and reactive to light.  Neck:     Vascular: No carotid bruit or JVD.  Cardiovascular:     Rate and Rhythm: Normal rate and regular rhythm. Occasional Extrasystoles are present.    Chest Wall: PMI is not displaced.     Pulses: Intact distal pulses.     Heart sounds: Heart sounds are distant. No murmur heard.    No friction rub. No gallop.     Comments: Normal S1 with split S2 Pulmonary:     Effort: Pulmonary effort is normal. No  respiratory distress.     Breath sounds: Normal breath sounds. No wheezing, rhonchi or rales.  Chest:  Chest wall: No tenderness.  Musculoskeletal:        General: No swelling. Normal range of motion.     Cervical back: Normal range of motion and neck supple.  Skin:    General: Skin is warm and dry.  Neurological:     General: No focal deficit present.     Mental Status: He is alert and oriented to person, place, and time. Mental status is at baseline.     Gait: Gait normal.  Psychiatric:        Mood and Affect: Mood normal.        Thought Content: Thought content normal.     Comments: A little anxious, tends to be focused on side effects of medications and ongoing symptoms.     Adult ECG Report  Rate: 61;  Rhythm: normal sinus rhythm and RBBB with PACs.  Bigeminy pattern.  Borderline LVH with LAD. ;   Narrative Interpretation: Stable   Recent Labs: Reviewed Lab Results  Component Value Date   CHOL 166 12/02/2021   HDL 41 12/02/2021   LDLCALC 100 (H) 12/02/2021   LDLDIRECT 180.6 07/18/2011   TRIG 142 12/02/2021   CHOLHDL 4.0 12/02/2021   Lab Results  Component Value Date   CREATININE 1.05 07/27/2021   BUN 15 07/27/2021   NA 139 07/27/2021   K 4.3 07/27/2021   CL 102 07/27/2021   CO2 23 07/27/2021      Latest Ref Rng & Units 09/12/2017    5:56 AM 09/11/2017    2:25 PM 05/26/2017    3:40 PM  CBC  WBC 4.0 - 10.5 K/uL 8.7  7.6  8.1   Hemoglobin 13.0 - 17.0 g/dL 16.1  09.6  04.5   Hematocrit 39.0 - 52.0 % 43.7  43.6  40.5   Platelets 150 - 400 K/uL 209  223  214     No results found for: "HGBA1C" Lab Results  Component Value Date   TSH 0.63 04/05/2012    ================================================== I spent a total of 28 minutes with the patient spent in direct patient consultation.  Additional time spent with chart review  / charting (studies, outside notes, etc): 27 min Total Time: 55 min  Current medicines are reviewed at length with the patient  today.  (+/- concerns) N/A  Notice: This dictation was prepared with Dragon dictation along with smart phrase technology. Any transcriptional errors that result from this process are unintentional and may not be corrected upon review.  Studies Ordered:   Orders Placed This Encounter  Procedures   Lipid panel   Comprehensive metabolic panel   CBC   EKG 12-Lead   Meds ordered this encounter  Medications   bisoprolol (ZEBETA) 5 MG tablet    Sig: Take 0.5 tablets (2.5 mg total) by mouth daily.    Dispense:  45 tablet    Refill:  3   aspirin EC 81 MG tablet    Sig: Take 1 tablet (81 mg total) by mouth daily. Swallow whole.    Dispense:  32 tablet    Refill:  0    Order Specific Question:   Lot Number?    Answer:   [id number]    Order Specific Question:   Expiration Date?    Answer:   02/04/2023    Order Specific Question:   Quantity    Answer:   32   Shared Decision Making/Informed Consent: The risks [stroke (1 in 1000), death (1 in 1000), kidney failure [usually temporary] (1 in  500), bleeding (1 in 200), allergic reaction [possibly serious] (1 in 200)], benefits (diagnostic support and management of coronary artery disease) and alternatives of a cardiac catheterization were discussed in detail with Mr. Benedick and he is willing to proceed.    Patient Instructions / Medication Changes & Studies & Tests Ordered   Patient Instructions  Medication Instructions:   No changes   *If you need a refill on your cardiac medications before your next appointment, please call your pharmacy*   Lab Work: next  Monday or Tuesday  CBC CMP LIPID  If you have labs (blood work) drawn today and your tests are completely normal, you will receive your results only by: MyChart Message (if you have MyChart) OR A paper copy in the mail If you have any lab test that is abnormal or we need to change your treatment, we will call you to review the results.   Testing/Procedures: Schedule at [facility]  - April 19 , 2024 Your physician has requested that you have a cardiac catheterization. Cardiac catheterization is used to diagnose and/or treat various heart conditions. Doctors may recommend this procedure for a number of different reasons. The most common reason is to evaluate chest pain. Chest pain can be a symptom of coronary artery disease (CAD), and cardiac catheterization can show whether plaque is narrowing or blocking your heart's arteries. This procedure is also used to evaluate the valves, as well as measure the blood flow and oxygen levels in different parts of your heart. For further information please visit [email or web address] Please follow instruction sheet, as given.   Follow-Up: At [facility], you and your health needs are our priority.  As part of our continuing mission to provide you with exceptional heart care, we have created designated Provider Care Teams.  These Care Teams include your primary Cardiologist (physician) and Advanced Practice Providers (APPs -  Physician Assistants and Nurse Practitioners) who all work together to provide you with the care you need, when you need it.     Your next appointment:   3 week(s)  The format for your next appointment:   In Person  Provider:   Bernadene Person NP    Other Instructions    [facility] A DEPT OF . [facility] AT [facility] [address] [id number] [place] [address] Dept: [phone] Loc: [phone]  JAYDENCE ARNESEN  06/15/2022  You are scheduled for a Cardiac Catheterization on Friday, April 19 with Dr. Bryan Lemma.  1. Please arrive at the [facility] (Main Entrance A) at [facility]: [address] [place], [place] [address] at 5:30 AM (This time is two hours before your procedure to ensure your preparation). Free valet parking service is available.   Special note: Every effort is made to have your  procedure done on time. Please understand that emergencies sometimes delay scheduled procedures.  2. Diet: Do not eat solid foods after midnight.  The patient may have clear liquids until 5am upon the day of the procedure.  3. Labs: You will need to have blood drawn on Tuesday, April 16 at [facility] [address], [place]  Open: 8am - 4 pm (Lunch 1:00 - 2:00)   Phone: [phone]. You do not need to be fasting.  4. Medication instructions in preparation for your procedure:   Contrast Allergy: No  On the morning of your procedure, take your Aspirin 81 mg and any morning medicines NOT listed  above.  You may use sips of water.  5. Plan to go home the same day, you will only stay overnight if medically necessary. 6. Bring a current list of your medications and current insurance cards. 7. You MUST have a responsible person to drive you home. 8. Someone MUST be with you the first 24 hours after you arrive home or your discharge will be delayed. 9. Please wear clothes that are easy to get on and off and wear slip-on shoes.  Thank you for allowing [place] to care for you!   -- [facility]      [name], MD, MS Bryan Lemma, M.D., M.S. Interventional Cardiologist  [facility]  Pager # [phone] Phone # [phone] [address] [place], [place] [address]   Thank you for choosing [facility]!!

## 2022-06-15 NOTE — Patient Instructions (Addendum)
Medication Instructions:   No changes   *If you need a refill on your cardiac medications before your next appointment, please call your pharmacy*   Lab Work: next  Monday or Tuesday  CBC CMP LIPID  If you have labs (blood work) drawn today and your tests are completely normal, you will receive your results only by: MyChart Message (if you have MyChart) OR A paper copy in the mail If you have any lab test that is abnormal or we need to change your treatment, we will call you to review the results.   Testing/Procedures: Schedule at [facility]  - April 19 , 2024 Your physician has requested that you have a cardiac catheterization. Cardiac catheterization is used to diagnose and/or treat various heart conditions. Doctors may recommend this procedure for a number of different reasons. The most common reason is to evaluate chest pain. Chest pain can be a symptom of coronary artery disease (CAD), and cardiac catheterization can show whether plaque is narrowing or blocking your heart's arteries. This procedure is also used to evaluate the valves, as well as measure the blood flow and oxygen levels in different parts of your heart. For further information please visit [email or web address] Please follow instruction sheet, as given.   Follow-Up: At [facility], you and your health needs are our priority.  As part of our continuing mission to provide you with exceptional heart care, we have created designated Provider Care Teams.  These Care Teams include your primary Cardiologist (physician) and Advanced Practice Providers (APPs -  Physician Assistants and Nurse Practitioners) who all work together to provide you with the care you need, when you need it.     Your next appointment:   3 week(s)  The format for your next appointment:   In Person  Provider:   Bernadene Person NP    Other Instructions    [facility] A DEPT OF [place]  AT [facility] [id number] [place] [address] Dept: [phone] Loc: [phone]  SHMAR FOCHS  06/15/2022  You are scheduled for a Cardiac Catheterization on Friday, April 19 with Dr. Bryan Lemma.  1. Please arrive at the [facility] (Main Entrance A) at [place] [facility]: [address] [place], [place] [address] at 5:30 AM (This time is two hours before your procedure to ensure your preparation). Free valet parking service is available.   Special note: Every effort is made to have your procedure done on time. Please understand that emergencies sometimes delay scheduled procedures.  2. Diet: Do not eat solid foods after midnight.  The patient may have clear liquids until 5am upon the day of the procedure.  3. Labs: You will need to have blood drawn on Tuesday, April 16 at [facility] [address], [place]  Open: 8am - 4 pm (Lunch 1:00 - 2:00)   Phone: [phone]. You do not need to be fasting.  4. Medication instructions in preparation for your procedure:   Contrast Allergy: No  On the morning of your procedure, take your Aspirin 81 mg and any morning medicines NOT listed above.  You may use sips of water.  5. Plan to go home the same day, you will only stay overnight if medically necessary. 6. Bring a current list of your medications and current insurance cards. 7. You MUST have a responsible person to drive you home. 8. Someone MUST be with you the first 24 hours after  you arrive home or your discharge will be delayed. 9. Please wear clothes that are easy to get on and off and wear slip-on shoes.  Thank you for allowing [place] to care for you!   -- [facility]

## 2022-06-16 ENCOUNTER — Encounter: Payer: Self-pay | Admitting: Cardiology

## 2022-06-16 DIAGNOSIS — G720 Drug-induced myopathy: Secondary | ICD-10-CM | POA: Insufficient documentation

## 2022-06-16 NOTE — Assessment & Plan Note (Addendum)
Coronary CTA suggested FFR positive lesions not seen on cardiac cath.  However now he continues to have ongoing symptoms.  At this point, I do not know that any other testing besides relook catheterization with the possibility of FFR and CFR (coronary flow reserve) be considered  We tried multiple different medications and he still having symptoms. Continue amlodipine and low-dose bisoprolol Refer back to CVRR lipid management Please at least be on aspirin, will defer to post-cath findings. Okay to continue fish oil and co-Q10.

## 2022-06-16 NOTE — Assessment & Plan Note (Signed)
Documented CAD, but has statin myopathy.  Now only on co-Q10 and fish oil.  Checked with a panel with precath labs and refer back to CV[facility] lipid clinic to discuss inclisiran versus PCSK9 inhibitor.

## 2022-06-16 NOTE — Assessment & Plan Note (Signed)
He has simvastatin listed as an allergy, but he tells me he has tried lovastatin, atorvastatin and rosuvastatin.  Intolerant of all of them because of myalgias and fatigue.  With continued symptoms concerning for possible angina and whether it be macrovascular or microvascular, we need to shoot for LDL less than 70 if not less than 55.  We will reassess lipid panel with part of his precath labs and then refer to CVRR per yet again consideration of inclisiran versus PCSK9 inhibitor.  I think inclisiran would be a good option for him because this would be visualized witnessed therapy and minimal occasions for potential side effect.

## 2022-06-16 NOTE — Assessment & Plan Note (Signed)
He had positive CT FFR but no relief seen on Coronary CTA.  Neck step will be to evaluate with RFR/FFR and even CFR via cardiac catheterization.  We have talked about Myoview or stress PET, but at this point I think we need to go definitive.

## 2022-06-16 NOTE — Assessment & Plan Note (Signed)
Pretty well-controlled on bisoprolol.  Refill 2.5 mg daily.

## 2022-06-16 NOTE — Assessment & Plan Note (Addendum)
Ongoing symptoms even though he did relatively well on CPX last year.  He is now saying he has resting discomfort.  Is possible that he had progression of disease from 2019, and at this point I think the only reasonable has left is a definitive test with cardiac catheterization.  This will allow [place] to do either FFR or even CFR to identify either focal lesions or global ischemia/microvascular disease.  Intolerant of Ranexa and Imdur but has been tolerating amlodipine along with the low-dose beta-blocker (bisoprolol 2.5 mg) currently.  Plan: Will schedule cardiac catheterization June 24, 2022-see shared decision making/informed consent below. I will have him get lipid panel checked along with his preop labs later this week. Will continue low-dose bisoprolol 2.5 mg daily-refill.  For antianginal and CAD. Continue amlodipine for antianginal benefit as well Again, he was not taking aspirin despite recommendations.  Certainly if he finds on the calf then he needs to take aspirin and possibly even Thienopyridine. It appears that he is no longer on ARB.  That would be 1 potential option.  Anginal benefit will be to add back afterload reduction. Statin intolerant.  Currently taking co-Q10 and fish oil. => Check lipid panel along with preop labs for And plan to refer back to CVRR assistance with the management.  He seems to be reasonable and considering inclisiran probably more so to PCSK9 inhibitor.  I explained to him that he needs to "follow-up with CVRR as recommended in order to get this process going." He says he was intolerant of Ranexa which is unfortunate based on the concern for microvascular disease.  He did not tolerate Imdur either.

## 2022-06-16 NOTE — Assessment & Plan Note (Signed)
My understanding was that he was on telmisartan but is not listed.  Neither his amlodipine.  Currently he tells me he is taking amlodipine 10 mg along with bisoprolol 2.5 mg daily.  Did not do well diuretic.  Did not do well with Imdur.  Neck step would be to add back ARB unless there is particular reason why he did not tolerate.  This will allow for afterload reduction and potentially help with microvascular perfusion.

## 2022-06-21 LAB — COMPREHENSIVE METABOLIC PANEL
ALT: 34 IU/L (ref 0–44)
AST: 28 IU/L (ref 0–40)
Albumin/Globulin Ratio: 1.8 (ref 1.2–2.2)
Albumin: 4.4 g/dL (ref 3.9–4.9)
Alkaline Phosphatase: 50 IU/L (ref 44–121)
BUN/Creatinine Ratio: 24 (ref 10–24)
BUN: 29 mg/dL — ABNORMAL HIGH (ref 8–27)
Bilirubin Total: 0.4 mg/dL (ref 0.0–1.2)
CO2: 26 mmol/L (ref 20–29)
Calcium: 10 mg/dL (ref 8.6–10.2)
Chloride: 99 mmol/L (ref 96–106)
Creatinine, Ser: 1.2 mg/dL (ref 0.76–1.27)
Globulin, Total: 2.4 g/dL (ref 1.5–4.5)
Glucose: 93 mg/dL (ref 70–99)
Potassium: 4.7 mmol/L (ref 3.5–5.2)
Sodium: 137 mmol/L (ref 134–144)
Total Protein: 6.8 g/dL (ref 6.0–8.5)
eGFR: 66 mL/min/{1.73_m2} (ref >59)

## 2022-06-21 LAB — LIPID PANEL
Chol/HDL Ratio: 3.5 ratio (ref 0.0–5.0)
Cholesterol, Total: 187 mg/dL (ref 100–199)
HDL: 53 mg/dL (ref >39)
LDL Chol Calc (NIH): 107 mg/dL — ABNORMAL HIGH (ref 0–99)
Triglycerides: 156 mg/dL — ABNORMAL HIGH (ref 0–149)
VLDL Cholesterol Cal: 27 mg/dL (ref 5–40)

## 2022-06-21 LAB — CBC
Hematocrit: 42.6 % (ref 37.5–51.0)
Hemoglobin: 14.5 g/dL (ref 13.0–17.7)
MCH: 31.3 pg (ref 26.6–33.0)
MCHC: 34 g/dL (ref 31.5–35.7)
MCV: 92 fL (ref 79–97)
Platelets: 219 10*3/uL (ref 150–450)
RBC: 4.64 x10E6/uL (ref 4.14–5.80)
RDW: 13.2 % (ref 11.6–15.4)
WBC: 10.8 10*3/uL (ref 3.4–10.8)

## 2022-06-23 ENCOUNTER — Telehealth: Payer: Self-pay | Admitting: *Deleted

## 2022-06-23 NOTE — Telephone Encounter (Signed)
Cardiac Catheterization scheduled at [facility] for: Friday June 24, 2022 7:30 AM Arrival time [facility] Main Entrance A at: 5:30 AM  Nothing to eat after midnight prior to procedure, clear liquids until 5 AM day of procedure.  Medication instructions: -Usual morning medications can be taken with sips of water including aspirin 81 mg.  Confirmed patient has responsible adult to drive home post procedure and be with patient first 24 hours after arriving home.  Plan to go home the same day, you will only stay overnight if medically necessary.  Reviewed procedure instructions with patient.

## 2022-06-24 ENCOUNTER — Ambulatory Visit (HOSPITAL_COMMUNITY)
Admission: RE | Disposition: A | Discharge status: Home / Self Care | Payer: Self-pay | Source: Ambulatory Visit | Attending: Cardiology

## 2022-06-24 ENCOUNTER — Ambulatory Visit (HOSPITAL_BASED_OUTPATIENT_CLINIC_OR_DEPARTMENT_OTHER): Payer: No Typology Code available for payment source

## 2022-06-24 ENCOUNTER — Ambulatory Visit (HOSPITAL_COMMUNITY)
Admission: RE | Admit: 2022-06-24 | Discharge: 2022-06-24 | Disposition: A | Discharge status: Home / Self Care | Payer: No Typology Code available for payment source | Source: Ambulatory Visit | Attending: Cardiology | Admitting: Cardiology

## 2022-06-24 DIAGNOSIS — E785 Hyperlipidemia, unspecified: Secondary | ICD-10-CM | POA: Insufficient documentation

## 2022-06-24 DIAGNOSIS — I77819 Aortic ectasia, unspecified site: Secondary | ICD-10-CM | POA: Diagnosis not present

## 2022-06-24 DIAGNOSIS — I25110 Atherosclerotic heart disease of native coronary artery with unstable angina pectoris: Secondary | ICD-10-CM | POA: Diagnosis not present

## 2022-06-24 DIAGNOSIS — I10 Essential (primary) hypertension: Secondary | ICD-10-CM | POA: Diagnosis not present

## 2022-06-24 DIAGNOSIS — I2584 Coronary atherosclerosis due to calcified coronary lesion: Secondary | ICD-10-CM | POA: Diagnosis not present

## 2022-06-24 DIAGNOSIS — Z87891 Personal history of nicotine dependence: Secondary | ICD-10-CM | POA: Diagnosis not present

## 2022-06-24 DIAGNOSIS — I120 Hypertensive chronic kidney disease with stage 5 chronic kidney disease or end stage renal disease: Secondary | ICD-10-CM | POA: Diagnosis present

## 2022-06-24 DIAGNOSIS — I2510 Atherosclerotic heart disease of native coronary artery without angina pectoris: Secondary | ICD-10-CM

## 2022-06-24 DIAGNOSIS — I200 Unstable angina: Secondary | ICD-10-CM

## 2022-06-24 DIAGNOSIS — I25119 Atherosclerotic heart disease of native coronary artery with unspecified angina pectoris: Secondary | ICD-10-CM | POA: Diagnosis present

## 2022-06-24 HISTORY — PX: CORONARY PRESSURE/FFR STUDY: CATH118243

## 2022-06-24 HISTORY — PX: LEFT HEART CATH AND CORONARY ANGIOGRAPHY: CATH118249

## 2022-06-24 HISTORY — PX: TRANSTHORACIC ECHOCARDIOGRAM: SHX275

## 2022-06-24 LAB — ECHOCARDIOGRAM COMPLETE
AR max vel: 2.53 cm2
AV Area VTI: 2.53 cm2
AV Area mean vel: 2.42 cm2
AV Mean grad: 5 mmHg
AV Peak grad: 9.6 mmHg
Ao pk vel: 1.55 m/s
Area-P 1/2: 3.15 cm2
Height: 73.5 in
S' Lateral: 2.6 cm
Weight: 3776 oz

## 2022-06-24 LAB — POCT ACTIVATED CLOTTING TIME
Activated Clotting Time: 250 seconds
Activated Clotting Time: 228 seconds

## 2022-06-24 SURGERY — LEFT HEART CATH AND CORONARY ANGIOGRAPHY
Anesthesia: LOCAL

## 2022-06-24 MED ORDER — ADENOSINE 12 MG/4ML IV SOLN
INTRAVENOUS | Status: AC
  Filled 2022-06-24: qty 4

## 2022-06-24 MED ORDER — ONDANSETRON HCL 4 MG/2ML IJ SOLN: 4.0000 mg | Freq: Four times a day (QID) | INTRAMUSCULAR | Status: DC | PRN

## 2022-06-24 MED ORDER — SODIUM CHLORIDE 0.9% FLUSH: 3.0000 mL | Freq: Two times a day (BID) | INTRAVENOUS | Status: DC

## 2022-06-24 MED ORDER — MIDAZOLAM HCL 2 MG/2ML IJ SOLN
INTRAMUSCULAR | Status: AC
  Filled 2022-06-24: qty 2

## 2022-06-24 MED ORDER — MIDAZOLAM HCL 2 MG/2ML IJ SOLN
INTRAMUSCULAR | Status: DC | PRN
  Administered 2022-06-24: 2 mg via INTRAVENOUS

## 2022-06-24 MED ORDER — ASPIRIN 81 MG PO CHEW: 81.0000 mg | CHEWABLE_TABLET | Freq: Once | ORAL | Status: DC

## 2022-06-24 MED ORDER — SODIUM CHLORIDE 0.9 % WEIGHT BASED INFUSION: 1.0000 mL/kg/h | INTRAVENOUS | Status: AC

## 2022-06-24 MED ORDER — VERAPAMIL HCL 2.5 MG/ML IV SOLN
INTRAVENOUS | Status: AC
  Filled 2022-06-24: qty 2

## 2022-06-24 MED ORDER — IOHEXOL 350 MG/ML SOLN
INTRAVENOUS | Status: DC | PRN
  Administered 2022-06-24: 105 mL via INTRA_ARTERIAL

## 2022-06-24 MED ORDER — LABETALOL HCL 5 MG/ML IV SOLN: 10.0000 mg | INTRAVENOUS | Status: AC | PRN

## 2022-06-24 MED ORDER — LIDOCAINE HCL (PF) 1 % IJ SOLN
INTRAMUSCULAR | Status: AC
  Filled 2022-06-24: qty 30

## 2022-06-24 MED ORDER — HEPARIN SODIUM (PORCINE) 1000 UNIT/ML IJ SOLN
INTRAMUSCULAR | Status: AC
  Filled 2022-06-24: qty 10

## 2022-06-24 MED ORDER — SODIUM CHLORIDE 0.9% FLUSH: 3.0000 mL | INTRAVENOUS | Status: DC | PRN

## 2022-06-24 MED ORDER — HEPARIN (PORCINE) IN NACL 1000-0.9 UT/500ML-% IV SOLN
INTRAVENOUS | Status: DC | PRN
  Administered 2022-06-24 (×2): 500 mL

## 2022-06-24 MED ORDER — VERAPAMIL HCL 2.5 MG/ML IV SOLN
INTRAVENOUS | Status: DC | PRN
  Administered 2022-06-24: 10 mL via INTRA_ARTERIAL

## 2022-06-24 MED ORDER — HYDRALAZINE HCL 20 MG/ML IJ SOLN: 10.0000 mg | INTRAMUSCULAR | Status: AC | PRN

## 2022-06-24 MED ORDER — HEPARIN SODIUM (PORCINE) 1000 UNIT/ML IJ SOLN
INTRAMUSCULAR | Status: DC | PRN
  Administered 2022-06-24: 5500 [IU] via INTRAVENOUS
  Administered 2022-06-24: 2000 [IU] via INTRAVENOUS
  Administered 2022-06-24: 5500 [IU] via INTRAVENOUS

## 2022-06-24 MED ORDER — ACETAMINOPHEN 325 MG PO TABS: 650.0000 mg | ORAL_TABLET | ORAL | Status: DC | PRN

## 2022-06-24 MED ORDER — LIDOCAINE HCL (PF) 1 % IJ SOLN
INTRAMUSCULAR | Status: DC | PRN
  Administered 2022-06-24: 2 mL

## 2022-06-24 MED ORDER — SODIUM CHLORIDE 0.9 % WEIGHT BASED INFUSION
3.0000 mL/kg/h | INTRAVENOUS | Status: AC
  Administered 2022-06-24: 3 mL/kg/h via INTRAVENOUS

## 2022-06-24 MED ORDER — FENTANYL CITRATE (PF) 100 MCG/2ML IJ SOLN
INTRAMUSCULAR | Status: DC | PRN
  Administered 2022-06-24: 25 ug via INTRAVENOUS

## 2022-06-24 MED ORDER — SODIUM CHLORIDE 0.9 % IV SOLN: 250.0000 mL | INTRAVENOUS | Status: DC | PRN

## 2022-06-24 MED ORDER — NITROGLYCERIN 1 MG/10 ML FOR IR/CATH LAB
INTRA_ARTERIAL | Status: AC
  Filled 2022-06-24: qty 10

## 2022-06-24 MED ORDER — ADENOSINE (DIAGNOSTIC) 140MCG/KG/MIN
INTRAVENOUS | Status: DC | PRN
  Administered 2022-06-24: 140 ug/kg/min via INTRAVENOUS

## 2022-06-24 MED ORDER — SODIUM CHLORIDE 0.9 % WEIGHT BASED INFUSION: 1.0000 mL/kg/h | INTRAVENOUS | Status: DC

## 2022-06-24 MED ORDER — FENTANYL CITRATE (PF) 100 MCG/2ML IJ SOLN
INTRAMUSCULAR | Status: AC
  Filled 2022-06-24: qty 2

## 2022-06-24 SURGICAL SUPPLY — 16 items
BAND CMPR LRG ZPHR (HEMOSTASIS) ×1
BAND ZEPHYR COMPRESS 30 LONG (HEMOSTASIS) IMPLANT
CATH OPTITORQUE TIG 4.0 5F (CATHETERS) IMPLANT
CATH VISTA GUIDE 6FR XB3.5 (CATHETERS) IMPLANT
ELECT DEFIB PAD ADLT CADENCE (PAD) IMPLANT
GLIDESHEATH SLEND SS 6F .021 (SHEATH) IMPLANT
GUIDEWIRE INQWIRE 1.5J.035X260 (WIRE) IMPLANT
GUIDEWIRE PRESSURE X 175 (WIRE) IMPLANT
INQWIRE 1.5J .035X260CM (WIRE) ×1
KIT ENCORE 26 ADVANTAGE (KITS) IMPLANT
KIT ESSENTIALS PG (KITS) IMPLANT
KIT HEART LEFT (KITS) ×2 IMPLANT
PACK CARDIAC CATHETERIZATION (CUSTOM PROCEDURE TRAY) ×2 IMPLANT
SHEATH PROBE COVER 6X72 (BAG) IMPLANT
TRANSDUCER W/STOPCOCK (MISCELLANEOUS) ×2 IMPLANT
TUBING CIL FLEX 10 FLL-RA (TUBING) ×2 IMPLANT

## 2022-06-24 NOTE — Interval H&P Note (Signed)
History and Physical Interval Note:  06/24/2022 7:39 AM  Aaron Stout  has presented today for surgery, with the diagnosis of angina.  The various methods of treatment have been discussed with the patient and family. After consideration of risks, benefits and other options for treatment, the patient has consented to  Procedure(s): LEFT HEART CATH AND CORONARY ANGIOGRAPHY (N/A) PERCUTANEOUS CORONARY INTERVENTION   as a surgical intervention.  The patient's history has been reviewed, patient examined, no change in status, stable for surgery.  I have reviewed the patient's chart and labs.  Questions were answered to the patient's satisfaction.    Cath Lab Visit (complete for each Cath Lab visit)  Clinical Evaluation Leading to the Procedure:   ACS: No.  Non-ACS:    Anginal Classification: CCS III  Anti-ischemic medical therapy: Maximal Therapy (2 or more classes of medications)  Non-Invasive Test Results: No non-invasive testing performed  Prior CABG: No previous CABG     Bryan Lemma

## 2022-06-24 NOTE — Discharge Instructions (Signed)
Radial Site Care  This sheet gives you information about how to care for yourself after your procedure. Your health care provider may also give you more specific instructions. If you have problems or questions, contact your health care provider. What can I expect after the procedure? After the procedure, it is common to have: Bruising and tenderness at the catheter insertion area. Follow these instructions at home: Medicines Take over-the-counter and prescription medicines only as told by your health care provider. Insertion site care Follow instructions from your health care provider about how to take care of your insertion site. Make sure you: Wash your hands with soap and water before you remove your bandage (dressing). If soap and water are not available, use hand sanitizer. May remove dressing in 24 hours. Check your insertion site every day for signs of infection. Check for: Redness, swelling, or pain. Fluid or blood. Pus or a bad smell. Warmth. Do no take baths, swim, or use a hot tub for 5 days. You may shower 24-48 hours after the procedure. Remove the dressing and gently wash the site with plain soap and water. Pat the area dry with a clean towel. Do not rub the site. That could cause bleeding. Do not apply powder or lotion to the site. Activity  For 24 hours after the procedure, or as directed by your health care provider: Do not flex or bend the affected arm. Do not push or pull heavy objects with the affected arm. Do not drive yourself home from the hospital or clinic. You may drive 24 hours after the procedure. Do not operate machinery or power tools. KEEP ARM ELEVATED THE REMAINDER OF THE DAY. Do not push, pull or lift anything that is heavier than 10 lb for 5 days. Ask your health care provider when it is okay to: Return to work or school. Resume usual physical activities or sports. Resume sexual activity. General instructions If the catheter site starts to  bleed, raise your arm and put firm pressure on the site. If the bleeding does not stop, get help right away. This is a medical emergency. DRINK PLENTY OF FLUIDS FOR THE NEXT 2-3 DAYS. No alcohol consumption for 24 hours after receiving sedation. If you went home on the same day as your procedure, a responsible adult should be with you for the first 24 hours after you arrive home. Keep all follow-up visits as told by your health care provider. This is important. Contact a health care provider if: You have a fever. You have redness, swelling, or yellow drainage around your insertion site. Get help right away if: You have unusual pain at the radial site. The catheter insertion area swells very fast. The insertion area is bleeding, and the bleeding does not stop when you hold steady pressure on the area. Your arm or hand becomes pale, cool, tingly, or numb. These symptoms may represent a serious problem that is an emergency. Do not wait to see if the symptoms will go away. Get medical help right away. Call your local emergency services (911 in the [place] Do not drive yourself to the hospital. Summary After the procedure, it is common to have bruising and tenderness at the site. Follow instructions from your health care provider about how to take care of your radial site wound. Check the wound every day for signs of infection.  This information is not intended to replace advice given to you by your health care provider. Make sure you discuss any questions you have with   your health care provider. Document Revised: 03/29/2017 Document Reviewed: 03/29/2017 Elsevier Patient Education  2020 [employer] Inc.  

## 2022-06-25 ENCOUNTER — Encounter (HOSPITAL_COMMUNITY): Payer: Self-pay | Admitting: Cardiology

## 2022-06-26 ENCOUNTER — Encounter: Payer: Self-pay | Admitting: Cardiology

## 2022-06-27 ENCOUNTER — Institutional Professional Consult (permissible substitution): Payer: No Typology Code available for payment source | Admitting: Thoracic Surgery (Cardiothoracic Vascular Surgery)

## 2022-06-27 VITALS — BP 136/75 | HR 68 | Resp 20 | Ht 73.0 in | Wt 236.0 lb

## 2022-06-27 DIAGNOSIS — I2510 Atherosclerotic heart disease of native coronary artery without angina pectoris: Secondary | ICD-10-CM

## 2022-06-27 IMAGING — US US FNA BIOPSY THYROID 1ST LESION
1 series · 12 of 12 positions shown · non-contrast
Comparison: Thyroid ultrasound dated 09/21/2020

INDICATION: Patient with history of thyroid nodule on physical exam and thyroid
ultrasound on 09/21/2020 which revealed multiple nodules with a
dominant 3.1 cm left mid nodule which meets criteria for biopsy. He
presents today for the procedure.

EXAM:
ULTRASOUND GUIDED FINE NEEDLE ASPIRATION BIOPSY OF LEFT MID THYROID
NODULE
TECHNIQUE: Informed written consent was obtained from the patient after a
discussion of the risks, benefits and alternatives to treatment.
Questions regarding the procedure were encouraged and answered. A
timeout was performed prior to the initiation of the procedure.

[Series 1: us fna biopsy thyroid 1st lesion · 0.07mm/px · 12 acquisitions, 12 frames shown]
[im 1/12]
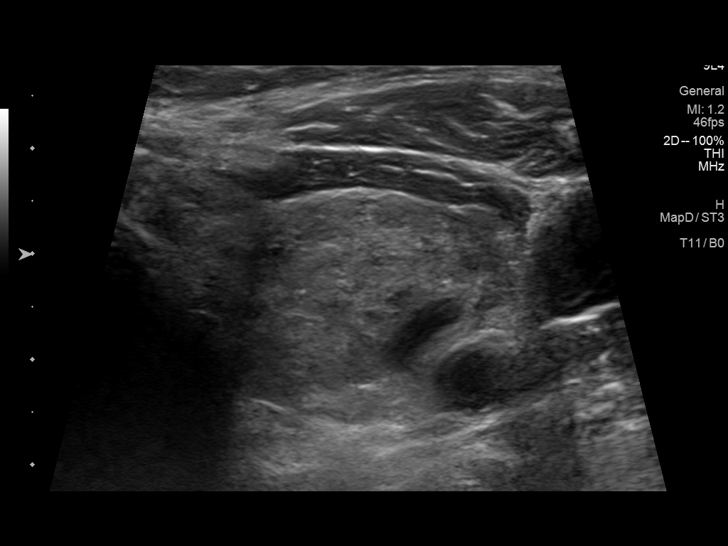
[im 2/12]
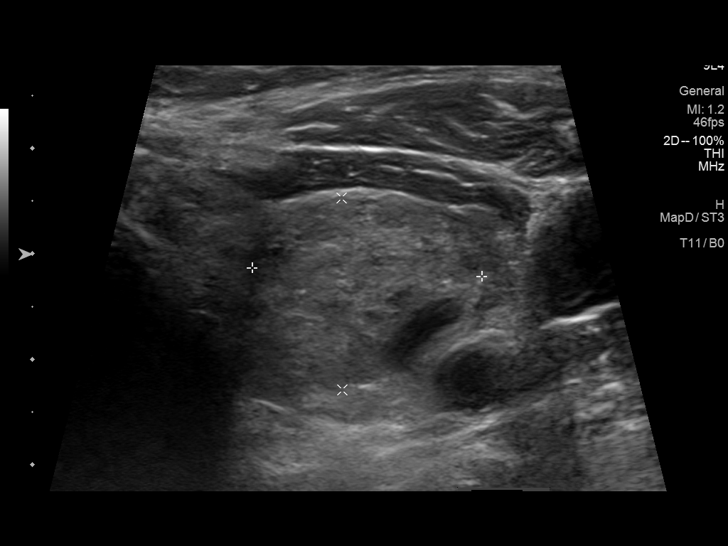
[im 3/12]
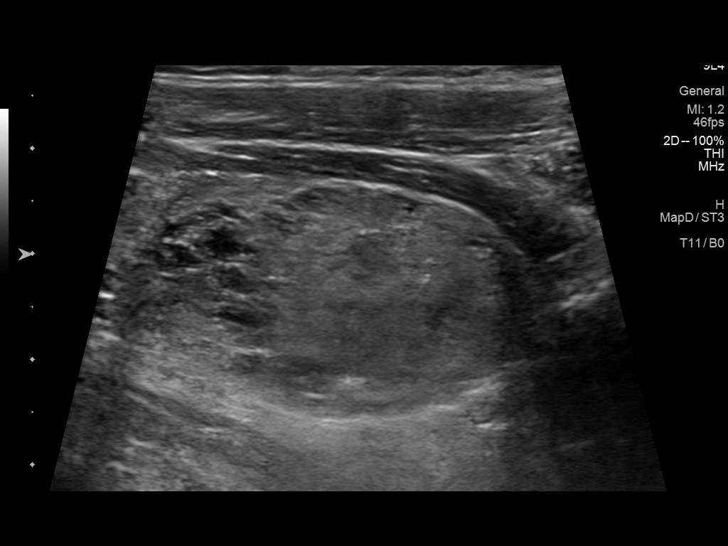
[im 4/12]
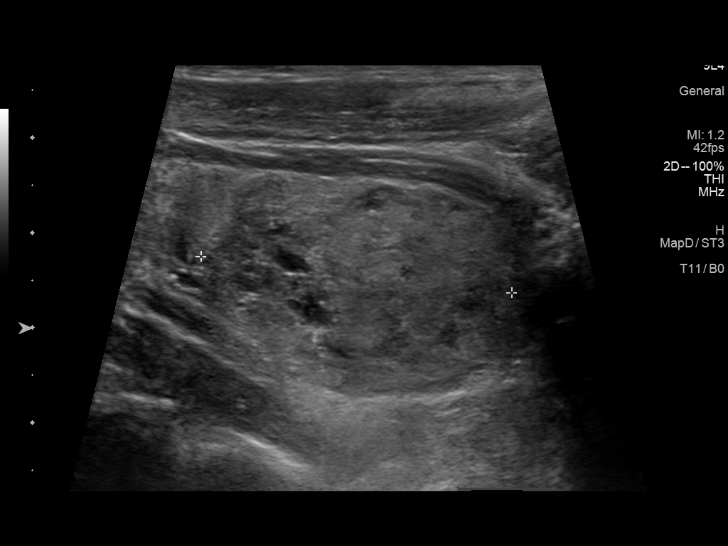
[im 5/12]
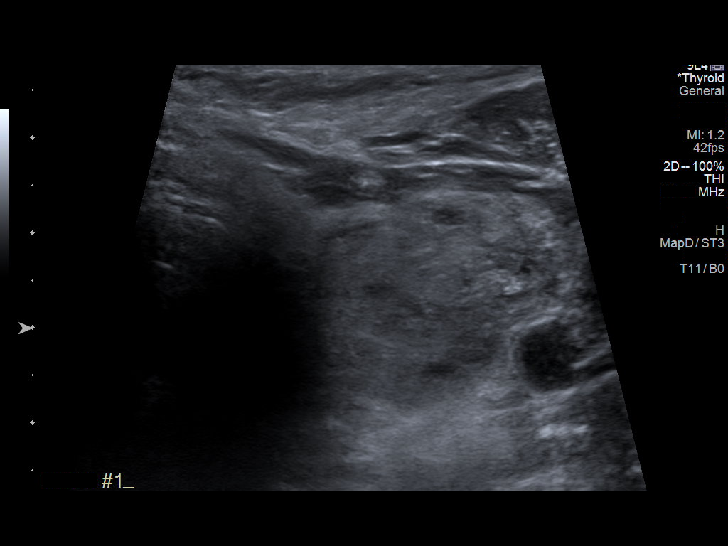
[im 6/12]
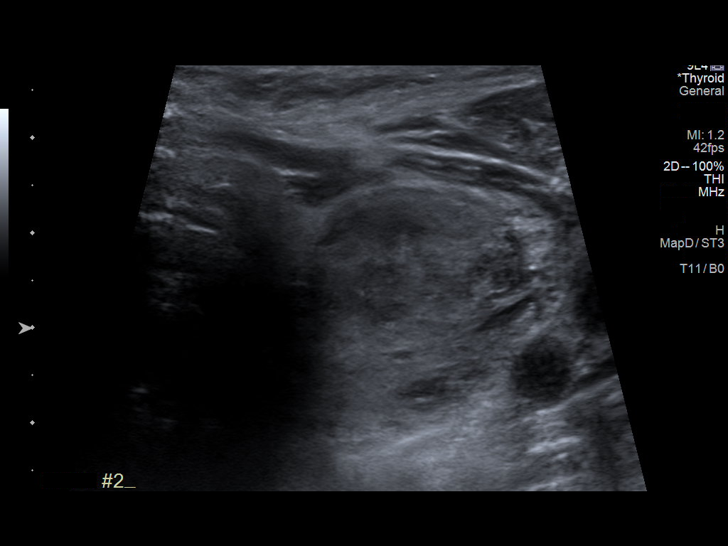
[im 7/12]
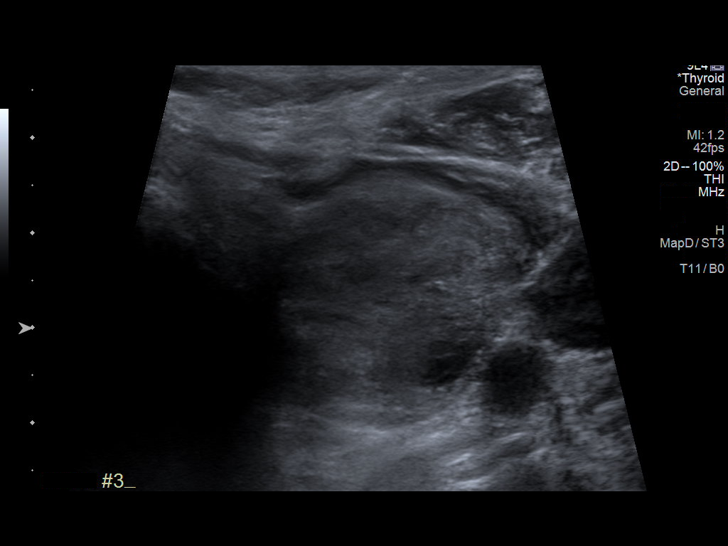
[im 8/12]
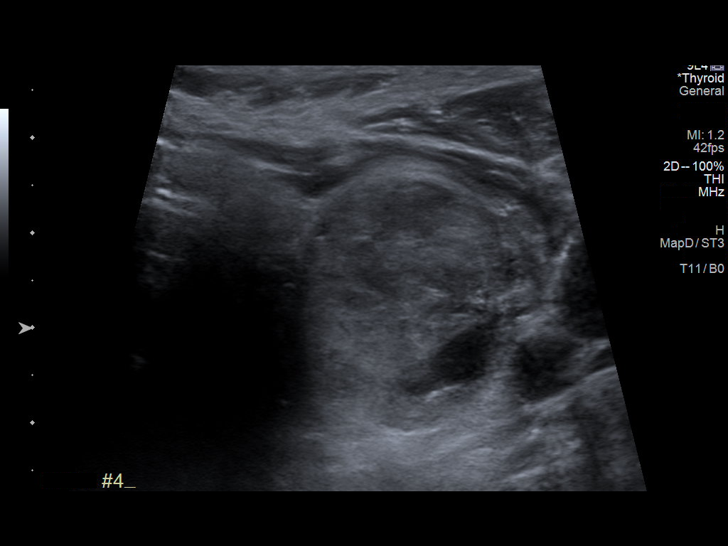
[im 9/12]
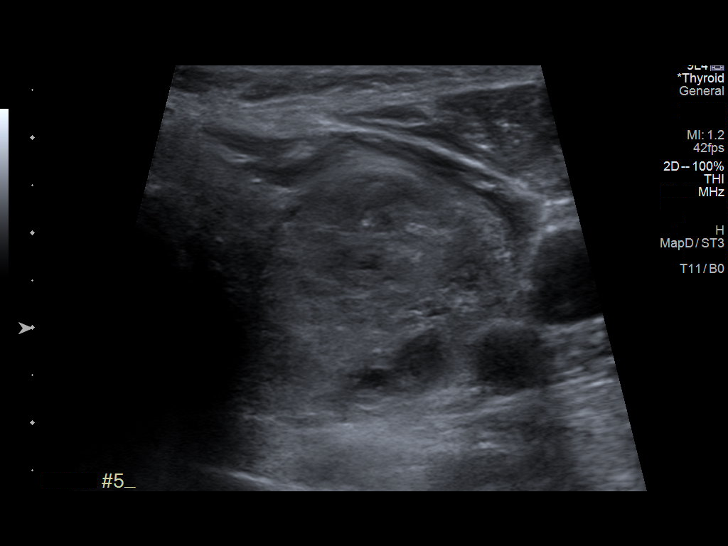
[im 10/12]
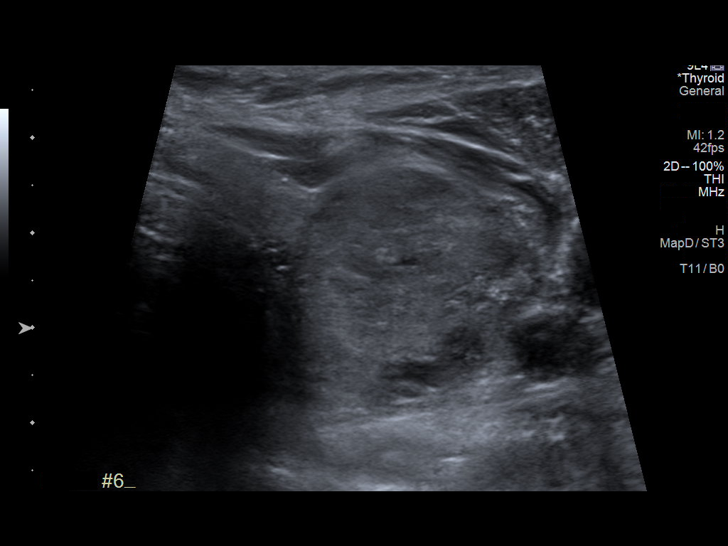
[im 11/12]
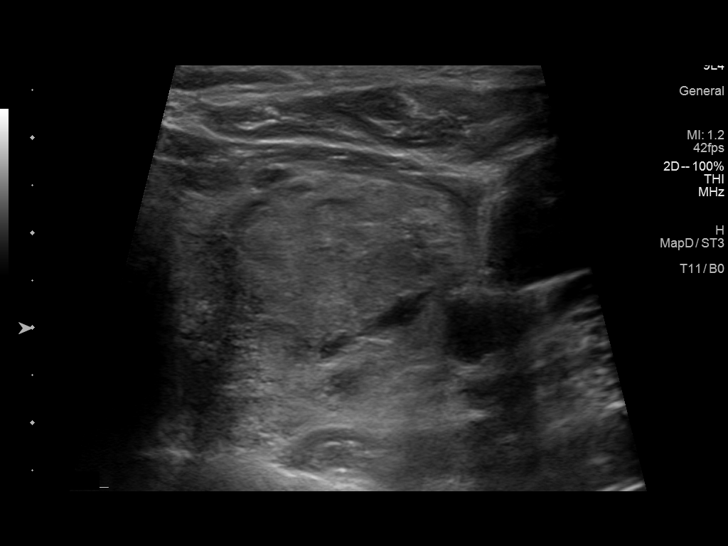
[im 12/12]
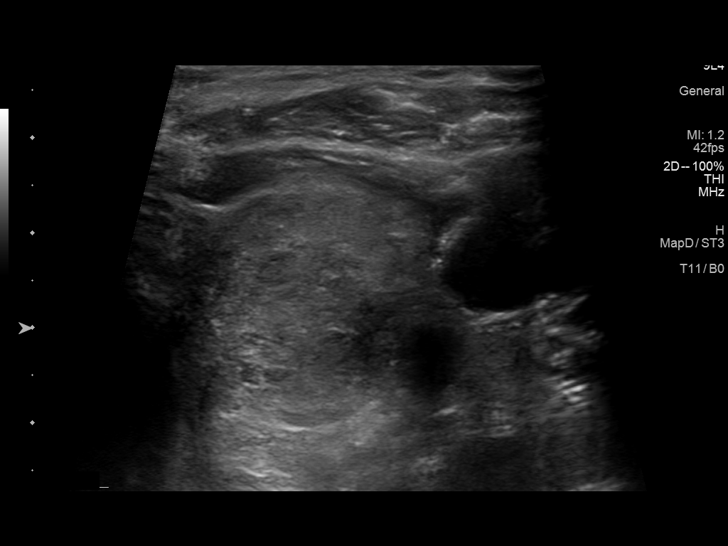

[12 of 12 positions shown; findings below may reference images not displayed]

MEDICATIONS:
1% lidocaine to skin and subcutaneous tissue

COMPLICATIONS:
None immediate.
Pre-procedural ultrasound scanning demonstrated unchanged size and
appearance of the indeterminate nodule within the left mid thyroid
lobe

The procedure was planned. The neck was prepped in the usual sterile
fashion, and a sterile drape was applied covering the operative
field. A timeout was performed prior to the initiation of the
procedure. Local anesthesia was provided with 1% lidocaine.

Under direct ultrasound guidance, 6 FNA biopsies were performed of
the left mid thyroid nodule with 27 gauge needles. Multiple
ultrasound images were saved for procedural documentation purposes.
The samples were prepared and submitted to pathology as well as for
Afirma testing.

Limited post procedural scanning was negative for hematoma or
additional complication. Dressings were placed. The patient
tolerated the above procedures procedure well without immediate
postprocedural complication.
FINDINGS: Nodule reference number based on prior diagnostic ultrasound: 6

Maximum size: 3.1 cm

Location: Left; Mid

ACR TI-RADS risk category: TR3 (3 points)

Reason for biopsy: meets ACR TI-RADS criteria

Ultrasound imaging confirms appropriate placement of the needles
within the thyroid nodule.
IMPRESSION: Technically successful ultrasound guided fine needle aspiration
biopsy of left mid thyroid nodule. Final pathology pending.

## 2022-06-27 MED FILL — Nitroglycerin IV Soln 100 MCG/ML in D5W: INTRA_ARTERIAL | Qty: 10 | Status: AC

## 2022-06-27 NOTE — Patient Instructions (Signed)
To discuss with Cardiology option of PCI vs CABG

## 2022-06-27 NOTE — Progress Notes (Signed)
[address]       [place] [address]             [phone]           Aaron Stout [facility] Medical Record #[id number] Date of Birth: 05-01-1955  Aaron Lex, MD Aaron Stout., MD  Chief Complaint:  Chest Pain   History of Present Illness:     Pt is a pleasant 67 yo male who has been treated for angina without improvement. Pt intolerant of several antianginals. Pt has had recent concern with rest pain that anatomy had worsened and underwent cath with FFR positive long segment proximal LAD disease. Pt with moderate OM disease and no disease in large RCA system. Pt was felt due to the need for a long segment stent with PCI to have opinion from CT surgery with CABG option. Pt gets intermittent substernal pain with activity that is resolved with rest and on occasion begins while sitting. Lasts for a short time. Has had little improvement with antianginals. Has had a CTA in past with small ascending aortic aneurysm at 43mm.       Past Medical History:  Diagnosis Date   Adenomatous colon polyp 2001   ALLERGIC RHINITIS 05/18/2007   Qualifier: Diagnosis of  By: Aaron Stout     Anxiety    Arthritis 2013   arthritis-hands; Per PCPs notes-inflammatory arthritis   ASTHMA 05/18/2007   Qualifier: Diagnosis of  By: Aaron Stout     Bipolar 1 disorder    With depression   Cancer    hx skin cancer   Chest tightness    Chronic fatigue syndrome    Chronic headaches    d/t old neck injury   Complication of anesthesia    x1 episode "panic attack" awakening-none in recent years   Diverticulosis    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  No change.   GASTROESOPHAGEAL REFLUX DISEASE 05/18/2007   Qualifier: Diagnosis of  By: Aaron Stout     HEADACHE, CHRONIC 05/18/2007   Qualifier: Diagnosis of  By: Aaron Stout     Heart murmur    benign    History of  degenerative disc disease    HOH (hard of hearing)    slight-bilateral   HSV-2 (herpes simplex virus 2) infection    Per PCP-genital   Hyperlipidemia 05/18/2007   Qualifier: Diagnosis of  By: Aaron Stout     Hyperlipidemia due to dietary fat intake    Hypertension    IBS (irritable bowel syndrome)    Incidental lung nodule, > 3mm and < 8mm 2019   Stable is a 2022   Internal hemorrhoids with other complication 09/11/2013   Irritable bowel syndrome 05/18/2007   Qualifier: Diagnosis of  By: Aaron Stout     Lyme disease    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  40% proximal to mid LCx.   OSA (obstructive sleep apnea)    better s/p UPP-no problems now   Personal history of adenomatous colonic polyps 05/18/2007   2001, 2004, 2006, 2009 (last with one small adenoma) Max 4 polyps in past with largest 7 mm (both in 2004)  09/11/2013 7 small polyps adenomas  02/2017 5 adeoimas max 10 mm repeat colon  03/2020     PONV (postoperative nausea and vomiting)    Prostatitis    PROSTATITIS, CHRONIC 05/18/2007   Qualifier: Diagnosis of  By: Aaron Stout     Radiculopathy of cervical spine    Syncope 05/2008   1 episode-evaluated by cardiologist-negative findings-?related to low B/p (hydration tends to help).   Testicular hypofunction    Vitamin D deficiency     Past Surgical History:  Procedure Laterality Date   BACK SURGERY  1975, 1985   Cardiopulmonary Exercise Test  07/29/2021   Preexercise spirometry was normal.  Exercised on cycle ergometer 11:45 min  -> 132 Watts => Resting HR 73 - Peak 124 (81% MPHR), Peak VO2 90% predicted..  Normal functional capacity.  No indication of cardiopulmonary rotation.  When corrected for ideal body weight, results suggest that body habitus is the primary contributing to her dyspnea.  Mild chronotropic competence noted.    CHOLECYSTECTOMY N/A 10/08/2014   Procedure: LAPAROSCOPIC CHOLECYSTECTOMY WITH INTRAOPERATIVE CHOLANGIOGRAM;  Surgeon: Aaron Bouillon, MD;  Location: [facility] OR;  Service: General;  Laterality: N/A;   COLONOSCOPY  02/20/2017   Aaron Stout- polyps   COLONOSCOPY  05/15/2020   Aaron Stout   COLONOSCOPY W/ BIOPSIES  multiple   CORONARY PRESSURE/FFR STUDY N/A 06/24/2022   Procedure: CORONARY PRESSURE/FFR STUDY;  Surgeon: Aaron Lex, MD;  Location: [facility] INVASIVE CV LAB;  Service: Cardiovascular;  Laterality: N/A;   CT CTA CORONARY W/CA SCORE W/CM &/OR WO/CM  08/23/2017   Coronary Calcium Score 719.  Mild eccentric calcified plaque in the Left Main.  Diffuse calcified plaque with moderate to severe plaque in the proximal to mid LAD along with diffuse 50 to 69% stenosis (cannot exclude greater than 70% stenosis in the mid LAD. => NO CT FFR 2/2 motion artifact.  Mild calcified plaque ost OM1 25 to 50%.  Mild diffuse plaque mid RCA 25 to 50%. Ascending aorta -39 mm. .   ESOPHAGOGASTRODUODENOSCOPY  multiple   GREEN LIGHT LASER TURP (TRANSURETHRAL RESECTION OF PROSTATE N/A 06/01/2012   Procedure: GREEN LIGHT LASER OF PROSTATE ;  Surgeon: Aaron Ludwig, MD;  Location: [facility] ORS;  Service: Urology;  Laterality: N/A;   HAND SURGERY  1971   tendon transplantation(injury)   HEMORRHOID BANDING     LEFT HEART CATH AND CORONARY ANGIOGRAPHY  05/2008   Minor luminal irregularity.  Normal LV function.  (Dr. Elease Stout)   LEFT HEART CATH AND CORONARY ANGIOGRAPHY N/A 09/12/2017   Procedure: LEFT HEART CATH AND CORONARY ANGIOGRAPHY;  Surgeon: Aaron Lex, MD;  Location: [facility] INVASIVE CV LAB;  Service: Cardiovascular; normal LV size and function.  EF 55 to 65%.  Normal LVEDP.  Extensive extraluminal calcification throughout the LAD but no notable stenosis in the LAD or diagonal branch.  Proximal LAD and mid LCx 40%.   LEFT HEART CATH AND CORONARY ANGIOGRAPHY N/A 06/24/2022   Procedure: LEFT HEART CATH AND CORONARY ANGIOGRAPHY;   Surgeon: Aaron Lex, MD;  Location: [facility] INVASIVE CV LAB;  Service: Cardiovascular;  Laterality: N/A;   MEDIAL PARTIAL KNEE REPLACEMENT Right    NASAL POLYP EXCISION     NM MYOVIEW LTD  04/2012   Mildly reduced EF.  Low risk.  No ischemia or infarction.   POLYPECTOMY     TENNIS ELBOW RELEASE/NIRSCHEL PROCEDURE Right    TONSILLECTOMY     TRANSTHORACIC ECHOCARDIOGRAM  07/28/2021   : Normal LV size and function.  EF 60 to 65%.  No RWMA.  GR 1 DD.  Normal  RV with normal RVSP, and normal RAP.Marland Kitchen  Normal AOV and MV.  Ascending aorta measuring 40 mm.   UPPER GASTROINTESTINAL ENDOSCOPY     UVULOPALATOPHARYNGOPLASTY  1990    Social History   Tobacco Use  Smoking Status Former   Packs/day: 0.50   Years: 7.00   Additional pack years: 0.00   Total pack years: 3.50   Types: Cigarettes   Start date: 03/07/1977   Quit date: 07/06/1980   Years since quitting: 42.0  Smokeless Tobacco Never    Social History   Substance and Sexual Activity  Alcohol Use No    Social History   Socioeconomic History   Marital status: Married    Spouse name: Not on file   Number of children: 2   Years of education: Not on file   Highest education level: Master's degree (e.g., MA, MS, MEng, MEd, MSW, MBA)  Occupational History   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  Tobacco Use   Smoking status: Former    Packs/day: 0.50    Years: 7.00    Additional pack years: 0.00    Total pack years: 3.50    Types: Cigarettes    Start date: 03/07/1977    Quit date: 07/06/1980    Years since quitting: 42.0   Smokeless tobacco: Never  Vaping Use   Vaping Use: Never used  Substance and Sexual Activity   Alcohol use: No   Drug use: No   Sexual activity: Yes    Partners: Female  Other Topics Concern   Not on file  Social History Narrative   Married father of 57 sons   7 year old son graduated Therapist, sports & Lives at home with parents (bipolar)   52 year old son went to [place]  then [employer] as of May 2021, living at home       He has a MA in Psychologist, educational - & teaches Art @ [employer]   Social Determinants of Health   Financial Resource Strain: Not on file  Food Insecurity: Not on file  Transportation Needs: Not on file  Physical Activity: Not on file  Stress: Not on file  Social Connections: Not on file  Intimate Partner Violence: Not on file    Allergies  Allergen Reactions   Duloxetine Hcl Other (See Comments)    Drowsiness  *Cymbalta   Ibuprofen Hives   Voltaren [Diclofenac] Shortness Of Breath    Also had palpitations   Penicillins Other (See Comments)    Unknown Has patient had a PCN reaction causing immediate rash, facial/tongue/throat swelling, SOB or lightheadedness with hypotension: Unknown Has patient had a PCN reaction causing severe rash involving mucus membranes or skin necrosis: Unknown Has patient had a PCN reaction that required hospitalization: Unknown Has patient had a PCN reaction occurring within the last 10 years: No If all of the above answers are "NO", then may proceed with Cephalosporin use.  Childhood response    Dilaudid [Hydromorphone Hcl] Nausea And Vomiting   Simvastatin Other (See Comments)    Cramps    Current Outpatient Medications  Medication Sig Dispense Refill   albuterol (PROVENTIL HFA;VENTOLIN HFA) 108 (90 Base) MCG/ACT inhaler Inhale 2 puffs into the lungs 2 (two) times daily. (Patient taking differently: Inhale 2 puffs into the lungs every 4 (four) hours as needed for wheezing or shortness of breath.) 1 Inhaler 3   amLODipine (NORVASC) 10 MG tablet Take 10 mg by mouth at bedtime.  amphetamine-dextroamphetamine (ADDERALL XR) 20 MG 24 hr capsule Take 1 capsule (20 mg total) by mouth 2 (two) times daily. (Patient taking differently: Take 40 mg by mouth every morning.) 60 capsule 0   Ascorbic Acid (VITAMIN C WITH ROSE HIPS) 1000 MG tablet Take 1,000 mg by mouth in the morning and at bedtime.      aspirin EC 81 MG tablet Take 1 tablet (81 mg total) by mouth daily. Swallow whole. 32 tablet 0   azelastine (ASTELIN) 0.1 % nasal spray Place 1 spray into both nostrils as needed for rhinitis. Use in each nostril as directed     B-12, Methylcobalamin, 1000 MCG SUBL Place 1,000 mcg under the tongue every evening.     bisoprolol (ZEBETA) 5 MG tablet Take 0.5 tablets (2.5 mg total) by mouth daily. (Patient taking differently: Take 2.5 mg by mouth at bedtime.) 45 tablet 3   Cholecalciferol (VITAMIN D) 2000 UNITS tablet Take 3,000 Units by mouth daily.      clotrimazole-betamethasone (LOTRISONE) cream Apply 1 application topically 2 (two) times daily as needed (for psoriasis).     Coenzyme Q10 (CO Q-10) 100 MG CAPS Take 100 mg by mouth daily.     colestipol (COLESTID) 5 g packet Take 10 g by mouth daily. (Patient taking differently: Take 7.5 g by mouth every evening.) 60 packet 5   DHEA 25 MG CAPS Take 25 mg by mouth daily.      escitalopram (LEXAPRO) 5 MG tablet Take 1.5 tablets (7.5 mg total) by mouth daily. 135 tablet 1   fexofenadine (ALLEGRA) 180 MG tablet Take 90 mg by mouth at bedtime.     fluticasone (FLONASE) 50 MCG/ACT nasal spray Place 1 spray into both nostrils as needed for allergies or rhinitis.     montelukast (SINGULAIR) 10 MG tablet Take 10 mg by mouth daily.     Multiple Vitamin (MULTIVITAMIN) tablet Take 1 tablet by mouth daily.     nystatin cream (MYCOSTATIN) Apply 1 Application topically as needed for dry skin.     Omega-3 Fatty Acids (FISH OIL) 1000 MG CAPS Take 2,000 mg by mouth 2 (two) times daily.      Omeprazole-Sodium Bicarbonate (ZEGERID) 20-1100 MG CAPS capsule Take 1 capsule by mouth 2 (two) times daily.      polycarbophil (FIBERCON) 625 MG tablet Take 5,000 mg by mouth daily.     Pregnenolone 50 MG TABS Take 100 mg by mouth daily.     QUERCETIN PO Take 1,000 mg by mouth in the morning and at bedtime.     QUEtiapine (SEROQUEL) 25 MG tablet Take 3 tablets (75 mg total) by  mouth at bedtime. 270 tablet 1   Turmeric 500 MG CAPS Take 500 mg by mouth 2 (two) times daily. With Black Pepper     valACYclovir (VALTREX) 500 MG tablet Take 500 mg by mouth 2 (two) times daily.      WIXELA INHUB 100-50 MCG/ACT AEPB Inhale 1 puff into the lungs 2 (two) times daily.     No current facility-administered medications for this visit.     Family History  Problem Relation Age of Onset   Breast cancer Mother 33   Heart failure Father 52   Bowel Disease Father 80       Bowel obstruction   CAD Brother 46       PCI   Heart attack Brother 76   CAD Brother 58       PCI   Hepatitis B Brother  Heart attack Maternal Grandfather    Congestive Heart Failure Maternal Grandfather    COPD Maternal Grandfather    Heart disease Other        grandfather/uncle   Alcoholism Other        greatgrandfather/uncle     Colon polyps Other        uncle   Bipolar disorder Son    Ehlers-Danlos syndrome Son    Migraines Son    Colon cancer Neg Hx    Esophageal cancer Neg Hx    Rectal cancer Neg Hx    Stomach cancer Neg Hx        Physical Exam: Appears well Lungs: clear Card: rr  Ext: warm and dry. No vericosities Neuro: alert and no focal deficits     Diagnostic Studies & Laboratory data: I have personally reviewed the following studies and agree with the findings   CATH (06/2022) Conclusion      Ost LAD to Mid LAD lesion is 40% stenosed. -Long segmental stenosis with stepwise RFR from after "SP2" all the way D1 (0.83-0.93); PHYSIOLOGICALLY SIGNIFICANT   Prox Cx lesion is 70% (RFR 0.93) stenosed. Mid Cx lesion is 65% stenosed (RFR 0.90, FFR 0.81).  BORDERLINE/PHYSICAL SIGNIFICANT and reasonable to revascularize.   Ost Cx to Prox Cx lesion is 30% stenosed.   The left ventricular systolic function is normal.  The left ventricular ejection fraction is 55-65% by visual estimate.   LV end diastolic pressure is normal.   There is no aortic valve stenosis.   POSTOP  DIAGNOSES 2-Vessel disease: LAD gives off a very high Ramus-like proximal D1.  Beyond D1 there is a very slight tapering over a long segment with several septal perforators and a small diagonal branch.  Beyond the bend with small D3 there is another septal (SP 4 - but labeled as SP2) before the vessel somewhat normalizes.  (This is over 70 mm of vessel) RFR measurement of the LAD beyond the SP(Labeled 2) for was significant at 0.83.  A pullback performed all the way back to D1 showed gradual improvement up to 0.93 indicating that the entire segment would be involved (over 70 mm) Large D1 has mild diffuse disease but no significant lesions. LCx takes a very sharp takeoff from the left main there is a 20 to 30% stenosis proximally in the first bend.  Beyond the second band there is are tandem 65-70% lesions that are focal. RFR measurement of the the tandem 65-70% lesions was borderline significant at 0.90, then with adenosine, RFR measurement was 0.81 (this is borderline significant but would warrant revascularization if complete revascularization is indicated) Very large dominant RCA with mild focal lesions but no significant stenoses Normal LVEF and EDP     RECOMMENDATIONS Based on the extent of disease in the LAD, I did not feel it was feasible to proceed directly to PCI especially with the LCx also being involved.  I favor CVTS consultation to determine the pros and cons of two-vessel CABG versus extensive PCI.  PCI would require extensive stent in the LAD of over 70 mm as well as roughly 24 mm of a relatively small caliber stent to the LCx. He will be scheduled for CVTS consultation to determine CABG versus two-vessel PCI. Continue and anginal medication.    TTE (06/2022) IMPRESSIONS     1. Left ventricular ejection fraction, by estimation, is 55 to 60%. The  left ventricle has normal function. The left ventricle has no regional  wall motion abnormalities. Left  ventricular diastolic  parameters are  consistent with Grade I diastolic  dysfunction (impaired relaxation).   2. Right ventricular systolic function is normal. The right ventricular  size is normal. Tricuspid regurgitation signal is inadequate for assessing  PA pressure.   3. Left atrial size was moderately dilated.   4. The mitral valve is normal in structure. Mild mitral valve  regurgitation. No evidence of mitral stenosis.   5. The aortic valve is tricuspid. Aortic valve regurgitation is not  visualized. No aortic stenosis is present.   6. Aortic dilatation noted. There is mild dilatation of the ascending  aorta, measuring 43 mm.   7. The inferior vena cava is dilated in size with <50% respiratory  variability, suggesting right atrial pressure of 15 mmHg.   FINDINGS   Left Ventricle: Left ventricular ejection fraction, by estimation, is 55  to 60%. The left ventricle has normal function. The left ventricle has no  regional wall motion abnormalities. The left ventricular internal cavity  size was normal in size. There is   no left ventricular hypertrophy. Left ventricular diastolic parameters  are consistent with Grade I diastolic dysfunction (impaired relaxation).   Right Ventricle: The right ventricular size is normal. No increase in  right ventricular wall thickness. Right ventricular systolic function is  normal. Tricuspid regurgitation signal is inadequate for assessing PA  pressure.   Left Atrium: Left atrial size was moderately dilated.   Right Atrium: Right atrial size was normal in size.   Pericardium: There is no evidence of pericardial effusion.   Mitral Valve: The mitral valve is normal in structure. Mild mitral valve  regurgitation. No evidence of mitral valve stenosis.   Tricuspid Valve: The tricuspid valve is normal in structure. Tricuspid  valve regurgitation is not demonstrated.   Aortic Valve: The aortic valve is tricuspid. Aortic valve regurgitation is  not visualized. No  aortic stenosis is present. Aortic valve mean gradient  measures 5.0 mmHg. Aortic valve peak gradient measures 9.6 mmHg. Aortic  valve area, by VTI measures 2.53  cm.   Pulmonic Valve: The pulmonic valve was normal in structure. Pulmonic valve  regurgitation is trivial.   Aorta: The aortic root is normal in size and structure and aortic  dilatation noted. There is mild dilatation of the ascending aorta,  measuring 43 mm.   Venous: The inferior vena cava is dilated in size with less than 50%  respiratory variability, suggesting right atrial pressure of 15 mmHg.   IAS/Shunts: No atrial level shunt detected by color flow Doppler.     LEFT VENTRICLE  PLAX 2D  LVIDd:         4.60 cm   Diastology  LVIDs:         2.60 cm   LV e' medial:    5.85 cm/s  LV PW:         1.10 cm   LV E/e' medial:  12.7  LV IVS:        1.00 cm   LV e' lateral:   11.70 cm/s  LVOT diam:     2.00 cm   LV E/e' lateral: 6.4  LV SV:         87  LV SV Index:   38  LVOT Area:     3.14 cm     RIGHT VENTRICLE             IVC  RV Basal diam:  3.80 cm     IVC diam: 2.70 cm  RV  Mid diam:    2.50 cm  RV S prime:     11.50 cm/s  TAPSE (M-mode): 2.6 cm   LEFT ATRIUM              Index        RIGHT ATRIUM           Index  LA diam:        4.10 cm  1.77 cm/m   RA Area:     12.70 cm  LA Vol (A2C):   113.0 ml 48.70 ml/m  RA Volume:   30.20 ml  13.02 ml/m  LA Vol (A4C):   95.1 ml  40.99 ml/m  LA Biplane Vol: 104.0 ml 44.82 ml/m   AORTIC VALVE                     PULMONIC VALVE  AV Area (Vmax):    2.53 cm      PV Vmax:          0.82 m/s  AV Area (Vmean):   2.42 cm      PV Peak grad:     2.7 mmHg  AV Area (VTI):     2.53 cm      PR End Diast Vel: 2.24 msec  AV Vmax:           155.00 cm/s  AV Vmean:          106.000 cm/s  AV VTI:            0.344 m  AV Peak Grad:      9.6 mmHg  AV Mean Grad:      5.0 mmHg  LVOT Vmax:         125.00 cm/s  LVOT Vmean:        81.500 cm/s  LVOT VTI:          0.277 m  LVOT/AV VTI  ratio: 0.81    AORTA  Ao Root diam: 3.40 cm  Ao Asc diam:  4.20 cm  Ao Arch diam: 3.5 cm   MITRAL VALVE  MV Area (PHT): 3.15 cm    SHUNTS  MV Decel Time: 241 msec    Systemic VTI:  0.28 m  MV E velocity: 74.40 cm/s  Systemic Diam: 2.00 cm  MV A velocity: 80.40 cm/s  MV E/A ratio:  0.93    Recent Radiology Findings:       Recent Lab Findings: Lab Results  Component Value Date   WBC 10.8 06/20/2022   HGB 14.5 06/20/2022   HCT 42.6 06/20/2022   PLT 219 06/20/2022   GLUCOSE 93 06/20/2022   CHOL 187 06/20/2022   TRIG 156 (H) 06/20/2022   HDL 53 06/20/2022   LDLDIRECT 180.6 07/18/2011   LDLCALC 107 (H) 06/20/2022   ALT 34 06/20/2022   AST 28 06/20/2022   NA 137 06/20/2022   K 4.7 06/20/2022   CL 99 06/20/2022   CREATININE 1.20 06/20/2022   BUN 29 (H) 06/20/2022   CO2 26 06/20/2022   TSH 0.63 04/05/2012      Assessment / Plan:     Pt with exercise and occasional rest angina not medically managed with antianginals. Echo with normal LV function. Pt with FFR positive LAD disease and OM stenosis. Pt would be a very good candidate for CABG and a long discussion with wife and pt was had to the benefits of CABG over PCI. Pt has concerns of a problem with small vessel disease remaining  after CABG and this would need to be determined after revascularization. Pt understands all the risks and goals of surgery and wishes to discuss further with Cardiology prior to making decision.    I have spent 60 min in review of the records, viewing studies and in face to face with patient and in coordination of future care    Eugenio Hoes 06/27/2022 9:13 AM

## 2022-06-27 NOTE — Telephone Encounter (Signed)
Completely impossible and improbable to respond to her message while I am in clinic.  I talked to him in detail during the procedure and after the procedure about what it means. The reason why we decided to go back to the Cath Lab was because we want to make sure that that was not evidence of small vessel disease or large vessel disease.  What we did in the Cath Lab was we tested 2 different arteries to see if the blockages were significant.  1 was right at the borderline and that would be something that we could treat with a stent, however the main artery on the front of the heart had a very long area that when put in combination led to having an abnormal result.  This means we did not need to search for microvascular disease because we found big artery disease/microvascular disease.  The reason why I referred him to Dr. Leafy Ro was because I wanted to see if putting a bypass graft to the artery with a long area of plaque would be reasonable to bypass, if so then it makes sense to bypass that and the other artery.  Otherwise we are left with consideration of significant amount of stent placement in the 1 artery or simply treating the easier artery and seeing how his symptoms go.  We wanted to see how treating medications for microvascular disease would help because the treatment is roughly the same.  The fact that he was still having angina despite multiple different types of therapy indicates that we need to do more therapy.  That is why went to the Cath Lab.   Bryan Lemma, MD

## 2022-06-29 NOTE — Telephone Encounter (Signed)
Called left message for patient on voicemail - appointment has been moved to 07/05/22 at 10 am  Any question may call back.

## 2022-07-05 ENCOUNTER — Ambulatory Visit: Payer: No Typology Code available for payment source | Attending: Nurse Practitioner | Admitting: Cardiology

## 2022-07-05 ENCOUNTER — Encounter: Payer: Self-pay | Admitting: Cardiology

## 2022-07-05 VITALS — BP 130/60 | HR 68 | Ht 73.0 in | Wt 235.0 lb

## 2022-07-05 DIAGNOSIS — I10 Essential (primary) hypertension: Secondary | ICD-10-CM

## 2022-07-05 DIAGNOSIS — I25119 Atherosclerotic heart disease of native coronary artery with unspecified angina pectoris: Secondary | ICD-10-CM

## 2022-07-05 DIAGNOSIS — E785 Hyperlipidemia, unspecified: Secondary | ICD-10-CM

## 2022-07-05 DIAGNOSIS — I200 Unstable angina: Secondary | ICD-10-CM

## 2022-07-05 NOTE — Patient Instructions (Signed)
Medication Instructions:  No changes  *If you need a refill on your cardiac medications before your next appointment, please call your pharmacy*   Lab Work: Not needed    Testing/Procedures: Not needed   Follow-Up: At [facility], you and your health needs are our priority.  As part of our continuing mission to provide you with exceptional heart care, we have created designated Provider Care Teams.  These Care Teams include your primary Cardiologist (physician) and Advanced Practice Providers (APPs -  Physician Assistants and Nurse Practitioners) who all work together to provide you with the care you need, when you need it.     Your next appointment:   Contact the office with your decision   The format for your next appointment:   In Person  Provider:   Bryan Lemma, MD    Other Instructions

## 2022-07-05 NOTE — Assessment & Plan Note (Signed)
Once he is able to make a decision about CABG versus PCI, we can then approach the issue of referral back to CVRR to discuss inclisiran versus [id number].  Honestly he may be somebody would really benefit from inclisiran to minimize any concerns of side effects symptoms since it is so infrequent.

## 2022-07-05 NOTE — Progress Notes (Signed)
Primary Care Provider: Cleatis Polka., MD [facility] Cardiologist: Bryan Lemma, MD Electrophysiologist: None  Clinic Note: Chief Complaint  Patient presents with   Hospitalization Follow-up    Post-cath follow-up to discuss options.  Multiple questions   Coronary Artery Disease    Continues to have angina    ===================================  ASSESSMENT/PLAN   Problem List Items Addressed This Visit       Cardiology Problems   Progressive angina (HCC) (Chronic)    Counterintuitive that with the amount of anginal symptoms he is complaining of that he is taking Vascepa to make his decision.  We have tried multiple different medications that he has not been able to tolerate.  Nitrates and Ranexa have been attempted and rejected. Cannot titrate bisoprolol up further despite him having palpitations because of chronotropic incompetence, and amlodipine is at max dose.  He is on co-Q10 which does provide some benefit.  We are simply awaiting his decision as to his options for revascularization.  I did discuss the risks and benefits/alternatives in detail for CABG versus PCI-see informed decision making statement below.      Hyperlipidemia LDL goal <70 (Chronic)    Once he is able to make a decision about CABG versus PCI, we can then approach the issue of referral back to CVRR to discuss inclisiran versus PCSK9 100.  Honestly he may be somebody would really benefit from inclisiran to minimize any concerns of side effects symptoms since it is so infrequent.      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  We spent a long time reviewing cath films and the anatomy on diagram as well as models.  We talked  about CABG with stents.  I talked about the longevity of stents and the idea of a very long segment of stented LAD and the requirement for for basely long-term antiplatelet agents and potential downsides of that.  The idea of complete revascularization with CABG without the need for Plavix.  We spent close to an hour and 5 minutes talking between him and his wife and myself about options.  He still is taking 100.  My recommendation is to proceed with CABG with Dr. Leafy Ro, however it is still not an unreasonable option to consider PCI.  I will review images with my interventional colleagues to get their opinion, but based on the length of disease in the LAD I think the best option is CABG.  I also mentioned to him that we could need to start getting more aggressive with his risk factor modification most notably his lipids with an LDL of 104 and him being intolerant to medications.  I would not give that discussion.  He is currently on low-dose beta-blocker and titrating calcium channel blocker along with aspirin and fish oil.  Once he is seen in follow-up after decision, we can then discuss referral back to lipid clinic for consideration of PCSK9 inhibitors or inclisiran based on statin intolerance.  At this point, he is still making him his mind and will contact us when he has made his decision.  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      ===================================  HPI:    Aaron Stout is a 67 y.o. male with a PMH notable for CAD-chronic chest pain, HTN, HLD, dilated ascending aorta, palpitations complicated by bipolar disorder and anxiety who presents today for 2-week-post cath follow-up.   Referring Physician: Cleatis Polka., MD.  Aaron Stout was last seen on December 22 by Bernadene Person, NP: Noted  "constant angina".  Stable, unchanged.  He had dizziness/intolerance of Ranexa that improved with stopping Ranexa.  Concerned about ongoing chest pain.  Has been intolerant of beta-blockers in the past because of chronotropic incompetence, so his bisoprolol dose was reduced to 2.5 mg daily..  Was already on 10 mg amlodipine.  A-> started on low-dose Imdur and consider the possibility of ischemic evaluation.  Intolerant to statins, never started medication despite lipid clinic evaluation.  Referred back to lipid clinic.   I just saw Aaron Stout on  06/15/2022 for reevaluation of ongoing chest discomfort symptoms concerning for angina.  Apparently he did not really tolerate Ranexa at all stating dizziness.  Similarly had dizziness and headache with Imdur.  He was taking amlodipine and low-dose bisoprolol that was recently added.  Although he did feel some dizziness with bisoprolol -=> better with lower dose.  He continued to note pretty much chronic "angina "chest pain that is usually exertional but sometimes happens at rest.  It is very much disturbing to him limiting him to expel anything.  He says the pain can happen while he is at rest trying to sleep at night, but is usually exertional.  He says that they are occurring more frequently it with more intensity.  These episodes are often associated with a little bit of dyspnea.  He has no dyspnea at rest or.  He denies any heart failure symptoms of PND, orthopnea or edema.  No claudication symptoms.  Some palpitations but no rapid irregular heartbeats.  Actually the palpitations did decrease with the bisoprolol. No syncope or near syncope, TIA/amaurosis fugax or claudication.  We talked about lipid management.  He just says he had very bad effects from multiple statins in the past and was just not willing to try anything for cholesterol.  He did not really follow-up with the plan.  I explained to him that really if were not able to treat his underlying  cause of the CAD, treating CAD is pointless.  We basically decided that the plan would be to proceed with cardiac catheterization with coronary angiography.  My plan was to perform RFR/FFR and any lesions that were concerning, and if no lesion was found to be significant, I would then perform CFR. He now presents to discuss results of cardiac catheterization.  Recent Hospitalizations:  06/24/2022-cardiac catheterization  He was seen by Dr. Leafy Ro on 06/25/2022 for CABG consultation.  They spent quite a bit of time discussing the Films and reviewing images indicating that the LAD obviously did not appear to be angiographically significant, but clearly the RFR was very positive.  Based on the anatomy and the fact that he is not having any improvement in symptoms or has been able to  tolerate standard antianginal medication, Dr. Leafy Ro did agree that he would be a very good candidate for CABG.  He was still confused and perseverating about the possibility of microvascular disease and wanted to follow back up to discuss  findings with me.  Reviewed  CV studies:    The following studies were reviewed today: (if available, images/films reviewed: From Epic Chart or Care Everywhere) TTE 06/24/2022: EF 55 to 60%.  No RWMA.  GR 1 DD.  Normal RV size and function.  Moderate LA dilation.  Normal aortic and mitral valve.  Ascending aorta measured at 43 mm.  Elevated RAP.  Cardiac Catheterization 06/24/2022:: Ost LAD to Mid LAD lesion is 40% stenosed. -Long segmental stenosis with stepwise RFR from after "SP2" all the way D1 (0.83-0.93); PHYSIOLOGICALLY SIGNIFICANT;   Prox Cx lesion is 70% (RFR 0.93) stenosed. Mid Cx lesion is 65% stenosed (RFR 0.90, FFR 0.81).  BORDERLINE/PHYSICAL SIGNIFICANT and reasonable to revascularize.  Dominance: Right   Interval History:   Aaron Stout returns here today for follow-up with multiple questions.  The majority the time was spent was discussing Findings and the  differences between CABG and PCI.  I explained to him that if STEMI macro and microvascular disease and that my plan was to evaluate for macro disease and if there was macro that did not need to look for micro disease.  The positive FFR and RFR findings in the LAD and circumflex would be consistent with macrovascular disease and therefore we did not need to look for Coronary Flow Reserve (CFR) which is a measurement of microvascular disease.  He still continues to note palpitations and chest discomfort both at rest and with any exertion.  He does mention that the palpitations are better since starting the bisoprolol, but he certainly cannot tolerate higher dose.  Amlodipine seems to be doing relatively well.  He continues to note fatigue and exercise intolerance with chest pain with just about any exertion.  Also noting having some chest discomfort episodes happening at rest, usually at night.  This is when he notes it is more fluttering or palpitation sensation.  He has pretty significant anxiety and tends to perseverate on treatment options.  Cardiovascular ROS: positive for - chest pain, dyspnea on exertion, palpitations, and exercise intolerance, fatigue, anxiety negative for - edema, orthopnea, paroxysmal nocturnal dyspnea, rapid heart rate, shortness of breath, or syncope or near syncope, TIA or emesis fugax, claudication; Melena, hematochezia, hematuria or epistaxis.    REVIEWED OF SYSTEMS   Review of Systems  Constitutional:  Positive for malaise/fatigue (Exercise intolerance more than fatigue.). Negative for weight loss.  HENT:  Negative for congestion.   Respiratory:  Negative for cough and shortness of breath (Per HPI).   Cardiovascular:        Per HPI  Musculoskeletal:  Negative for falls, joint pain (Nothing more than aches and pains.) and myalgias.  Neurological:  Positive for headaches. Negative for dizziness and focal weakness.  Psychiatric/Behavioral:  Negative for depression  (Just getting a little frustrated) and memory loss. The patient is nervous/anxious and has insomnia.    I have reviewed and (if needed) personally updated the patient's problem list, medications, allergies, past medical and surgical history, social and family history.   PAST MEDICAL HISTORY   Past Medical History:  Diagnosis Date   Adenomatous colon polyp 2001   ALLERGIC RHINITIS 05/18/2007   Qualifier: Diagnosis of  By: Yetta Barre RN, CGRN, Sheri     Anxiety    Arthritis 2013   arthritis-hands; Per PCPs notes-inflammatory arthritis   ASTHMA 05/18/2007   Qualifier: Diagnosis of  By: Yetta Barre RN, CGRN, Sheri     Bipolar 1 disorder [facility])    With depression   Cancer (HCC)  hx skin cancer   Chest tightness    Chronic fatigue syndrome    Chronic headaches    d/t old neck injury   Complication of anesthesia    x1 episode "panic attack" awakening-none in recent years   Coronary artery disease involving native coronary artery of native heart with other form of angina pectoris (HCC) 06/2022   Cardiac catheter revealed ostial mid LAD 40% stenosis followed by a long segment after D1/SP1 all the way to SP2 - RFR 0.83 (physiologically significant ); tandem Prox & Mid Cx ~70% ~borderline RFR/GFR (0.9 & 0.8 -> reasonable to revascularize given symptoms)   Diverticulosis    Ectatic thoracic aorta (HCC) 07/2018   Follow-up CTA chest 07/27/2021: Ascending aortic dilation 4.2 cm.  No change.   GASTROESOPHAGEAL REFLUX DISEASE 05/18/2007   Qualifier: Diagnosis of  By: Yetta Barre RN, CGRN, Sheri     HEADACHE, CHRONIC 05/18/2007   Qualifier: Diagnosis of  By: Yetta Barre RN, CGRN, Sheri     Heart murmur    benign    History of degenerative disc disease    HOH (hard of hearing)    slight-bilateral   HSV-2 (herpes simplex virus 2) infection    Per PCP-genital   Hyperlipidemia 05/18/2007   Qualifier: Diagnosis of  By: Yetta Barre RN, CGRN, Sheri     Hyperlipidemia due to dietary fat intake    Hypertension    IBS  (irritable bowel syndrome)    Incidental lung nodule, > 3mm and < 8mm 2019   Stable is a 2022   Internal hemorrhoids with other complication 09/11/2013   Irritable bowel syndrome 05/18/2007   Qualifier: Diagnosis of  By: Yetta Barre RN, CGRN, Sheri     Lyme disease    OSA (obstructive sleep apnea)    better s/p UPP-no problems now   Personal history of adenomatous colonic polyps 05/18/2007   2001, 2004, 2006, 2009 (last with one small adenoma) Max 4 polyps in past with largest 7 mm (both in 2004)  09/11/2013 7 small polyps adenomas  02/2017 5 adeoimas max 10 mm repeat colon 03/2020     PONV (postoperative nausea and vomiting)    Prostatitis    PROSTATITIS, CHRONIC 05/18/2007   Qualifier: Diagnosis of  By: Yetta Barre RN, CGRN, Sheri     Radiculopathy of cervical spine    Syncope 05/2008   1 episode-evaluated by cardiologist-negative findings-?related to low B/p (hydration tends to help).   Testicular hypofunction    Vitamin D deficiency     PAST SURGICAL HISTORY   Past Surgical History:  Procedure Laterality Date   BACK SURGERY  1975, 1985   Cardiopulmonary Exercise Test  07/29/2021   Preexercise spirometry was normal.  Exercised on cycle ergometer 11:45 min  -> 132 Watts => Resting HR 73 - Peak 124 (81% MPHR), Peak VO2 90% predicted..  Normal functional capacity.  No indication of cardiopulmonary rotation.  When corrected for ideal body weight, results suggest that body habitus is the primary contributing to her dyspnea.  Mild chronotropic competence noted.   CHOLECYSTECTOMY N/A 10/08/2014   Procedure: LAPAROSCOPIC CHOLECYSTECTOMY WITH INTRAOPERATIVE CHOLANGIOGRAM;  Surgeon: Harriette Bouillon, MD;  Location: [facility] OR;  Service: General;  Laterality: N/A;   COLONOSCOPY  02/20/2017   Gessner- polyps   COLONOSCOPY  05/15/2020   Gessner   COLONOSCOPY W/ BIOPSIES  multiple   CORONARY PRESSURE/FFR STUDY N/A 06/24/2022   Procedure: CORONARY PRESSURE/FFR STUDY;  Surgeon: [name], MD;  Location: [facility]  INVASIVE CV LAB;  p-mLAD RFR +++ (  0.83); Tandem 70% prox & mid LCx - RFR 090 & FFR 0.81 - ~BORDERLINE.   CT CTA CORONARY W/CA SCORE W/CM &/OR WO/CM  08/23/2017   Coronary Calcium Score 719.  Mild eccentric calcified plaque in the Left Main.  Diffuse calcified plaque with moderate to severe plaque in the proximal to mid LAD along with diffuse 50 to 69% stenosis (cannot exclude greater than 70% stenosis in the mid LAD. => NO CT FFR 2/2 motion artifact.  Mild calcified plaque ost OM1 25 to 50%.  Mild diffuse plaque mid RCA 25 to 50%. Ascending aorta -39 mm. .   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  Normal LV function.  (Dr. Elease Hashimoto)   LEFT HEART CATH AND CORONARY ANGIOGRAPHY N/A 09/12/2017   Procedure: LEFT HEART CATH AND CORONARY ANGIOGRAPHY;  Surgeon: [name], MD;  Location: [facility] INVASIVE CV LAB;  Service: Cardiovascular; normal LV size and function.  EF 55 to 65%.  Normal LVEDP.  Extensive extraluminal calcification throughout the LAD but no notable stenosis in the LAD or diagonal branch.  Proximal LAD and mid LCx 40%.   LEFT HEART CATH AND CORONARY ANGIOGRAPHY N/A 06/24/2022   Procedure: LEFT HEART CATH AND CORONARY ANGIOGRAPHY;  Surgeon: [name], MD;  Location: [facility] INVASIVE CV LAB;  Service: CV:  mLAD (from D1/SP1 to SP2) long  ~40-50% - RFR +++ (0.83), Tandem 70% prox & mid LCx ([place] RFR/FFR). Minimal dom RCA.  Normal LV Fx & EDP.   MEDIAL PARTIAL KNEE REPLACEMENT Right    NASAL POLYP EXCISION     NM MYOVIEW LTD  04/2012   Mildly reduced EF.  Low risk.  No ischemia or infarction.   POLYPECTOMY     TENNIS  ELBOW RELEASE/NIRSCHEL PROCEDURE Right    TONSILLECTOMY     TRANSTHORACIC ECHOCARDIOGRAM  06/24/2022   EF 55 to 60%.  No RWMA.  GR 1 DD.  Normal RV size and function.  Moderate LA dilation.  Normal aortic and mitral valve.  Ascending aorta measured at 43 mm.  Elevated RAP.   UPPER GASTROINTESTINAL ENDOSCOPY     UVULOPALATOPHARYNGOPLASTY  1990     MEDICATIONS/ALLERGIES   Current Meds  Medication Sig   albuterol (PROVENTIL HFA;VENTOLIN HFA) 108 (90 Base) MCG/ACT inhaler Inhale 2 puffs into the lungs 2 (two) times daily.   amLODipine (NORVASC) 10 MG tablet Take 10 mg by mouth at bedtime.   amphetamine-dextroamphetamine (ADDERALL XR) 20 MG 24 hr capsule Take 1 capsule (20 mg total) by mouth 2 (two) times daily.   Ascorbic Acid (VITAMIN C WITH ROSE HIPS) 1000 MG tablet Take 1,000 mg by mouth in the morning and at bedtime.   aspirin EC 81 MG tablet Take 1 tablet (81 mg total) by mouth daily. Swallow whole.   azelastine (ASTELIN) 0.1 % nasal spray Place 1 spray into both nostrils as needed for rhinitis. Use in each nostril as directed   B-12, Methylcobalamin, 1000 MCG SUBL Place 1,000 mcg under the tongue every evening.   bisoprolol (ZEBETA) 5 MG tablet Take 0.5 tablets (2.5 mg total) by mouth daily.   Cholecalciferol (VITAMIN D)  2000 UNITS tablet Take 3,000 Units by mouth daily.    clotrimazole-betamethasone (LOTRISONE) cream Apply 1 application topically 2 (two) times daily as needed (for psoriasis).   Coenzyme Q10 (CO Q-10) 100 MG CAPS Take 100 mg by mouth daily.   colestipol (COLESTID) 5 g packet Take 10 g by mouth daily. (Patient taking differently: Take 7.5 g by mouth every evening.)   DHEA 25 MG CAPS Take 25 mg by mouth daily.    escitalopram (LEXAPRO) 5 MG tablet Take 1.5 tablets (7.5 mg total) by mouth daily.   fexofenadine (ALLEGRA) 180 MG tablet Take 90 mg by mouth at bedtime.   fluticasone (FLONASE) 50 MCG/ACT nasal spray Place 1 spray into both nostrils as needed for allergies or  rhinitis.   montelukast (SINGULAIR) 10 MG tablet Take 10 mg by mouth daily.   Multiple Vitamin (MULTIVITAMIN) tablet Take 1 tablet by mouth daily.   nystatin cream (MYCOSTATIN) Apply 1 Application topically as needed for dry skin.   Omega-3 Fatty Acids (FISH OIL) 1000 MG CAPS Take 2,000 mg by mouth 2 (two) times daily.    Omeprazole-Sodium Bicarbonate (ZEGERID) 20-1100 MG CAPS capsule Take 1 capsule by mouth 2 (two) times daily.    polycarbophil (FIBERCON) 625 MG tablet Take 5,000 mg by mouth daily.   Pregnenolone 50 MG TABS Take 100 mg by mouth daily.   QUERCETIN PO Take 1,000 mg by mouth in the morning and at bedtime.   QUEtiapine (SEROQUEL) 25 MG tablet Take 3 tablets (75 mg total) by mouth at bedtime.   Turmeric 500 MG CAPS Take 500 mg by mouth 2 (two) times daily. With Black Pepper   valACYclovir (VALTREX) 500 MG tablet Take 500 mg by mouth 2 (two) times daily.    WIXELA INHUB 100-50 MCG/ACT AEPB Inhale 1 puff into the lungs 2 (two) times daily.     Allergies  Allergen Reactions   Duloxetine Hcl Other (See Comments)    Drowsiness  *Cymbalta   Ibuprofen Hives   Voltaren [Diclofenac] Shortness Of Breath    Also had palpitations   Penicillins Other (See Comments)    Unknown Has patient had a PCN reaction causing immediate rash, facial/tongue/throat swelling, SOB or lightheadedness with hypotension: Unknown Has patient had a PCN reaction causing severe rash involving mucus membranes or skin necrosis: Unknown Has patient had a PCN reaction that required hospitalization: Unknown Has patient had a PCN reaction occurring within the last 10 years: No If all of the above answers are "NO", then may proceed with Cephalosporin use.  Childhood response    Dilaudid [Hydromorphone Hcl] Nausea And Vomiting   Simvastatin Other (See Comments)    Cramps    SOCIAL HISTORY/FAMILY HISTORY   Reviewed in Epic:  Pertinent findings:  Social History   Tobacco Use   Smoking status: Former     Packs/day: 0.50    Years: 7.00    Additional pack years: 0.00    Total pack years: 3.50    Types: Cigarettes    Start date: 03/07/1977    Quit date: 07/06/1980    Years since quitting: 42.0   Smokeless tobacco: Never  Vaping Use   Vaping Use: Never used  Substance Use Topics   Alcohol use: No   Drug use: No   Social History   Social History Narrative   Married father of 77 sons   6 year old son graduated Therapist, sports & Lives at home with parents (bipolar)   49 year old son went to [employer] then [employer]  graduate as of May 2021, living at home       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  Constitutional:      General: He is not in acute distress.    Appearance: Normal appearance. He is obese. He is not ill-appearing or toxic-appearing.  HENT:     Head: Normocephalic and atraumatic.  Eyes:     Extraocular Movements: Extraocular movements intact.     Pupils: Pupils are equal, round, and reactive to light.  Neck:     Vascular: No carotid bruit or JVD.  Cardiovascular:     Rate and Rhythm: Normal rate and regular rhythm. Occasional Extrasystoles are present.    Chest Wall: PMI is not displaced.     Pulses: Normal pulses and intact distal pulses.     Heart sounds: Heart sounds are distant. No murmur heard.    No friction rub. No gallop.     Comments: Normal S1 with split S2 Pulmonary:     Effort: Pulmonary effort is normal. No respiratory distress.     Breath sounds: Normal breath sounds. No wheezing, rhonchi or rales.  Chest:     Chest wall: No tenderness.  Musculoskeletal:        General: No swelling.  Normal range of motion.     Cervical back: Normal range of motion and neck supple.  Skin:    General: Skin is warm and dry.  Neurological:     General: No focal deficit present.     Mental Status: He is alert and oriented to person, place, and time. Mental status is at baseline.     Cranial Nerves: No cranial nerve deficit.     Gait: Gait normal.  Psychiatric:        Mood and Affect: Mood normal.        Thought Content: Thought content normal.     Comments: Less anxious - but perseverates on treatment options -- having difficulty deciding      Adult ECG Report N/a  Recent Labs: Reviewed.  Not at goal.  We did not discuss lipids at this visit. Lab Results  Component Value Date   CHOL 187 06/20/2022   HDL 53 06/20/2022   LDLCALC 107 (H) 06/20/2022   LDLDIRECT 180.6 07/18/2011   TRIG 156 (H) 06/20/2022   CHOLHDL 3.5 06/20/2022   Lab Results  Component Value Date   CREATININE 1.20 06/20/2022   BUN 29 (H) 06/20/2022   NA 137 06/20/2022   K 4.7 06/20/2022   CL 99 06/20/2022   CO2 26 06/20/2022      Latest Ref Rng & Units 06/20/2022    8:31 AM 09/12/2017    5:56 AM 09/11/2017    2:25 PM  CBC  WBC 3.4 - 10.8 x10E3/uL 10.8  8.7  7.6   Hemoglobin 13.0 - 17.7 g/dL 09.8  11.9  14.7   Hematocrit 37.5 - 51.0 % 42.6  43.7  43.6   Platelets 150 - 450 x10E3/uL 219  209  223  No results found for: "HGBA1C" Lab Results  Component Value Date   TSH 0.63 04/05/2012    ================================================== I spent a total of 28 minutes with the patient spent in direct patient consultation.  Additional time spent with chart review  / charting (studies, outside notes, etc): 27 min Total Time: 55 min  Current medicines are reviewed at length with the patient today.  (+/- concerns) N/A  Notice: This dictation was prepared with Dragon dictation along with smart phrase technology. Any transcriptional errors that result from this process are unintentional and may not be  corrected upon review.  Studies Ordered:   No orders of the defined types were placed in this encounter.  No orders of the defined types were placed in this encounter.  Shared Decision Making/Informed Consent: The risks [stroke (1 in 1000), death (1 in 1000), kidney failure [usually temporary] (1 in 500), bleeding (1 in 200), allergic reaction [possibly serious] (1 in 200)], benefits (diagnostic support and management of coronary artery disease) and alternatives of a CORONARY ARTERY PERCUTANEOUS CORONARY INTERVENTION were discussed in detail with Aaron Stout and he is going to decide but he would prefer to go for CABG or PCI.    Patient Instructions / Medication Changes & Studies & Tests Ordered   Patient Instructions  Medication Instructions:  No changes  *If you need a refill on your cardiac medications before your next appointment, please call your pharmacy*   Lab Work: Not needed    Testing/Procedures: Not needed   Follow-Up: At [facility], you and your health needs are our priority.  As part of our continuing mission to provide you with exceptional heart care, we have created designated Provider Care Teams.  These Care Teams include your primary Cardiologist (physician) and Advanced Practice Providers (APPs -  Physician Assistants and Nurse Practitioners) who all work together to provide you with the care you need, when you need it.     Your next appointment:   Contact the office with your decision   The format for your next appointment:   In Person  Provider:   Bryan Lemma, MD    Other Instructions      [name], MD, MS Bryan Lemma, M.D., M.S. Interventional Cardiologist  [facility]  Pager # [phone] Phone # [phone] [address] [place], [place] [address]   Thank you for choosing [facility] [place]!!

## 2022-07-05 NOTE — Assessment & Plan Note (Addendum)
Counterintuitive that with the amount of anginal symptoms he is complaining of that he is taking Vascepa to make his decision.  We have tried multiple different medications that he has not been able to tolerate.  Nitrates and Ranexa have been attempted and rejected. Cannot titrate bisoprolol up further despite him having palpitations because of chronotropic incompetence, and amlodipine is at max dose.  He is on co-Q10 which does provide some benefit.  We are simply awaiting his decision as to his options for revascularization.  I did discuss the risks and benefits/alternatives in detail for CABG versus PCI-see informed decision making statement below.

## 2022-07-05 NOTE — Assessment & Plan Note (Signed)
BP controlled with amlodipine and bisoprolol

## 2022-07-05 NOTE — Assessment & Plan Note (Signed)
Cardiac cath now shows a long area of the LAD that is significant by RFR as well as focal lesions in the LCx.  We spent a long time reviewing cath films and the anatomy on diagram as well as models.  We talked about CABG with stents.  I talked about the longevity of stents and the idea of a very long segment of stented LAD and the requirement for for basely long-term antiplatelet agents and potential downsides of that.  The idea of complete revascularization with CABG without the need for Plavix.  We spent close to an hour and 5 minutes talking between him and his wife and myself about options.  He still is taking 100.  My recommendation is to proceed with CABG with Dr. Leafy Ro, however it is still not an unreasonable option to consider PCI.  I will review images with my interventional colleagues to get their opinion, but based on the length of disease in the LAD I think the best option is CABG.  I also mentioned to him that we could need to start getting more aggressive with his risk factor modification most notably his lipids with an LDL of 104 and him being intolerant to medications.  I would not give that discussion.  He is currently on low-dose beta-blocker and titrating calcium channel blocker along with aspirin and fish oil.  Once he is seen in follow-up after decision, we can then discuss referral back to lipid clinic for consideration of PCSK9 inhibitors or inclisiran based on statin intolerance.  At this point, he is still making him his mind and will contact us when he has made his decision.  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

## 2022-07-06 ENCOUNTER — Ambulatory Visit: Payer: No Typology Code available for payment source | Admitting: Nurse Practitioner

## 2022-07-22 ENCOUNTER — Encounter: Payer: Self-pay | Admitting: Cardiology

## 2022-07-22 DIAGNOSIS — I200 Unstable angina: Secondary | ICD-10-CM

## 2022-07-22 DIAGNOSIS — I25119 Atherosclerotic heart disease of native coronary artery with unspecified angina pectoris: Secondary | ICD-10-CM

## 2022-07-22 NOTE — Telephone Encounter (Signed)
I am more than happy for him to see whoever else he wants to see.  As I mentioned to him in a very very long clinic visit, the options are simple is either bypass surgery or long area of stent.   I guess we can going to put a referral in.  Cannot guarantee that he is going see the specific doctor that he wants to see but we can put the referral in for that doctor.  If this is the option he chooses, then if he wants to come back this way I would recommend that he be scheduled to see a different cardiologist.   Bryan Lemma, MD

## 2022-08-02 ENCOUNTER — Telehealth: Payer: Self-pay

## 2022-08-02 NOTE — Telephone Encounter (Signed)
[name] said he's taken the pills before and did fine with them. I faxed [facility] # [phone] and let them know.    He asked about his SIBO results. I called [facility] and they are re-faxing the results. I will place them on Dr Marvell Fuller desk to see when he returns next week.

## 2022-08-02 NOTE — Telephone Encounter (Signed)
Received a fax from [facility] pharmacy saying that they are unable to order the colestipol granules packet's. I have called Aaron Stout and left a detailed message for him to call me back. I need to know if he wants to try and find it at another pharmacy and I will re-send it.

## 2022-08-02 NOTE — Telephone Encounter (Signed)
Patient is returning your call.  

## 2022-08-07 ENCOUNTER — Encounter: Payer: Self-pay | Admitting: Internal Medicine

## 2022-08-07 DIAGNOSIS — K638219 Small intestinal bacterial overgrowth, unspecified: Secondary | ICD-10-CM | POA: Insufficient documentation

## 2022-08-07 HISTORY — DX: Small intestinal bacterial overgrowth, unspecified: K63.8219

## 2022-08-18 ENCOUNTER — Ambulatory Visit: Payer: No Typology Code available for payment source | Admitting: Psychiatry

## 2022-08-18 ENCOUNTER — Encounter: Payer: Self-pay | Admitting: Psychiatry

## 2022-08-18 DIAGNOSIS — F4310 Post-traumatic stress disorder, unspecified: Secondary | ICD-10-CM

## 2022-08-18 DIAGNOSIS — F5105 Insomnia due to other mental disorder: Secondary | ICD-10-CM

## 2022-08-18 DIAGNOSIS — F900 Attention-deficit hyperactivity disorder, predominantly inattentive type: Secondary | ICD-10-CM

## 2022-08-18 DIAGNOSIS — F411 Generalized anxiety disorder: Secondary | ICD-10-CM

## 2022-08-18 NOTE — Progress Notes (Signed)
Aaron Stout [id number] December 14, 1955 67 y.o.  Subjective:   Patient ID:  Aaron Stout is a 67 y.o. (DOB January 25, 1956) male.  Chief Complaint:  Chief Complaint  Patient presents with   Follow-up   Depression   Anxiety   ADD   Sleeping Problem    Depression        Associated symptoms include fatigue.  Associated symptoms include no decreased concentration and no suicidal ideas.  Past medical history includes anxiety.   Anxiety Symptoms include chest pain, nervous/anxious behavior and shortness of breath. Patient reports no confusion, decreased concentration, dizziness, palpitations or suicidal ideas.     Aaron Stout presents to the office today for follow-up of mood and anxiety and sleep.  At visit in 2020had residual anxiety and it was suggested he disc with cardiologist the use of a beta blocker metoprolol and it helped BP and  anxiety DT multiple failed anxiety meds.  Has been generally good.    At  visit 10/27/19  Lexapro was increased from 5 to 7.5mg  dailty for anxiety which is chronic. Thinks the anxiety is a helpful.  I  Chronic anxiety and stress at work.  Evaluations pending at work.  Less obsessing about that.  Has tried 10mg  Lexapro and he feels it was too much.  visit October 2020 without med changes.    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   Still CO sweating easily. Pretty good overall.  No major changes since here.  Same old chronic tiredness and easily OOB.  Still a little anxiety hangs on.  Not horrible and able to function and keep on going.    12/19/2019 appointment with the following noted: Fine overall.  Generally steady with mood and other factors. Chronic tiredness without change.  Trying to manage stress as much as possible. Stress Hudson back in the house and some lability continues with anger outbursts.  He always finds someone at work he doesn't like and starts ruminating on it. SE  mild hangover. Mild hangover with Seroquel which helps the sleep. Makes him sleepy in 60-90 mins. Chronic sweating and SOB. Plan: No med changes  06/18/2020 appointment with following noted: No Covid.   Knee surgery 12/21. Otherwise health is good.    02/25/21 appt noted: Taking lexapro 7.5 mg , Adderall and quetiapine 75 mg HS. No SE.  Sleep pretty good. Tiredness worse in the day.  08/26/21  appt noted:  HT at [facility] ph has the Adderall. Benfit Adderall No SE  Overll doing ok with mood and anxity. Pt reports that mood is occ irritable and Anxious and describes anxiety as mild-moderate like if too many things coming at him at once.   Anxiety symptoms include: Excessive Worry,. Pt reports no sleep issues. Pt reports that appetite is good. Pt reports that energy is good and good. Concentration is good. Suicidal thoughts:  denied by patient Plan: no med changes  03/03/22 appt noted: Steady overall.   Still some hangover effects from Seroquel.   The HT version of Adderall is less effective than the [facility] version but supply problem. Recent dx angina with microvascular Dz.  On a couple of meds for that.  Cardiology aware he's taking Adderall XR 20 BID. Saw cardiology last week.  Just started Imdur and so far less chest pain.   No SE otherwise and satisfied with meds. Plan: No med changes. 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  08/18/2022 appointment noted: Meds as above Some SOB and CP with 2nd opinion as to cause.  No clear answer yet.   Asked question as to how to deal with differences between first and second opinion.   Is in some turmoil bc of the uncertainty as to what to do.  Some anxiety over this health concern.   No indication for med changes.   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  He's aware and tries to catch  it.    Past Psychiatric Medication Trials: Lexapro 10 SE, Zoloft,  Emsam,  Welllbutrin Adderall, Ritalin , Concerta, Vyvanse, seroquel 75 for sleep, perphenazine, Depakote,  Vraylar 1.5, lithium 600, Trileptal, Buspar dizzy, lamotrigine,  Belsomra,   Review of Systems:  Review of Systems  Constitutional:  Positive for fatigue.       Long history of sweating  Respiratory:  Positive for shortness of breath.   Cardiovascular:  Positive for chest pain. Negative for palpitations.  Musculoskeletal:  Positive for joint swelling.  Neurological:  Negative for dizziness and tremors.  Psychiatric/Behavioral:  Positive for depression. Negative for agitation, behavioral problems, confusion, decreased concentration, dysphoric mood, hallucinations, self-injury, sleep disturbance and suicidal ideas. The patient is nervous/anxious. The patient is not hyperactive.     Medications: I have reviewed the patient's current medications.  Current Outpatient Medications  Medication Sig Dispense Refill   albuterol (PROVENTIL HFA;VENTOLIN HFA) 108 (90 Base) MCG/ACT inhaler Inhale 2 puffs into the lungs 2 (two) times daily. 1 Inhaler 3   amLODipine (NORVASC) 10 MG tablet Take 10 mg by mouth at bedtime.     amphetamine-dextroamphetamine (ADDERALL XR) 20 MG 24 hr capsule Take 1 capsule (20 mg total) by mouth 2 (two) times daily. 60 capsule 0   Ascorbic Acid (VITAMIN C WITH ROSE HIPS) 1000 MG tablet Take 1,000 mg by mouth in the morning and at bedtime.     aspirin EC 81 MG tablet Take 1 tablet (81 mg total) by mouth daily. Swallow whole. 32 tablet 0   azelastine (ASTELIN) 0.1 % nasal spray Place 1 spray into both nostrils as needed for rhinitis. Use in each nostril as directed     B-12, Methylcobalamin, 1000 MCG SUBL Place 1,000 mcg under the tongue every evening.     bisoprolol (ZEBETA) 5 MG tablet Take 0.5 tablets (2.5 mg total) by mouth daily. 45 tablet 3   Cholecalciferol (VITAMIN D) 2000 UNITS tablet Take 3,000  Units by mouth daily.      clotrimazole-betamethasone (LOTRISONE) cream Apply 1 application topically 2 (two) times daily as needed (for psoriasis).     Coenzyme Q10 (CO Q-10) 100 MG CAPS Take 100 mg by mouth daily.     colestipol (COLESTID) 5 g packet Take 10 g by mouth daily. (Patient taking differently: Take 7.5 g by mouth every evening.) 60 packet 5   DHEA 25 MG CAPS Take 25 mg by mouth daily.      escitalopram (LEXAPRO) 5 MG tablet Take 1.5 tablets (7.5 mg total) by mouth daily. 135 tablet 1   fexofenadine (ALLEGRA) 180 MG tablet Take 90 mg by mouth at bedtime.     fluticasone (FLONASE) 50 MCG/ACT nasal spray Place 1 spray into both nostrils as needed for allergies or rhinitis.     montelukast (SINGULAIR) 10 MG tablet Take 10 mg by mouth daily.  Multiple Vitamin (MULTIVITAMIN) tablet Take 1 tablet by mouth daily.     nystatin cream (MYCOSTATIN) Apply 1 Application topically as needed for dry skin.     Omega-3 Fatty Acids (FISH OIL) 1000 MG CAPS Take 2,000 mg by mouth 2 (two) times daily.      Omeprazole-Sodium Bicarbonate (ZEGERID) 20-1100 MG CAPS capsule Take 1 capsule by mouth 2 (two) times daily.      polycarbophil (FIBERCON) 625 MG tablet Take 5,000 mg by mouth daily.     Pregnenolone 50 MG TABS Take 100 mg by mouth daily.     QUERCETIN PO Take 1,000 mg by mouth in the morning and at bedtime.     QUEtiapine (SEROQUEL) 25 MG tablet Take 3 tablets (75 mg total) by mouth at bedtime. 270 tablet 1   Turmeric 500 MG CAPS Take 500 mg by mouth 2 (two) times daily. With Black Pepper     valACYclovir (VALTREX) 500 MG tablet Take 500 mg by mouth 2 (two) times daily.      WIXELA INHUB 100-50 MCG/ACT AEPB Inhale 1 puff into the lungs 2 (two) times daily.     No current facility-administered medications for this visit.    Medication Side Effects: None ? sweating  Allergies:  Allergies  Allergen Reactions   Duloxetine Hcl Other (See Comments)    Drowsiness  *Cymbalta   Ibuprofen Hives    Voltaren [Diclofenac] Shortness Of Breath    Also had palpitations   Penicillins Other (See Comments)    Unknown Has patient had a PCN reaction causing immediate rash, facial/tongue/throat swelling, SOB or lightheadedness with hypotension: Unknown Has patient had a PCN reaction causing severe rash involving mucus membranes or skin necrosis: Unknown Has patient had a PCN reaction that required hospitalization: Unknown Has patient had a PCN reaction occurring within the last 10 years: No If all of the above answers are "NO", then may proceed with Cephalosporin use.  Childhood response    Dilaudid [Hydromorphone Hcl] Nausea And Vomiting   Simvastatin Other (See Comments)    Cramps    Past Medical History:  Diagnosis Date   Adenomatous colon polyp 2001   ALLERGIC RHINITIS 05/18/2007   Qualifier: Diagnosis of  By: Yetta Barre RN, CGRN, Sheri     Anxiety    Arthritis 2013   arthritis-hands; Per PCPs notes-inflammatory arthritis   ASTHMA 05/18/2007   Qualifier: Diagnosis of  By: Yetta Barre RN, CGRN, Sheri     Bipolar 1 disorder (HCC)    With depression   Cancer (HCC)    hx skin cancer   Chest tightness    Chronic fatigue syndrome    Chronic headaches    d/t old neck injury   Complication of anesthesia    x1 episode "panic attack" awakening-none in recent years   Coronary artery disease involving native coronary artery of native heart with other form of angina pectoris (HCC) 06/2022   Cardiac catheter revealed ostial mid LAD 40% stenosis followed by a long segment after D1/SP1 all the way to SP2 - RFR 0.83 (physiologically significant ); tandem Prox & Mid Cx ~70% ~borderline RFR/GFR (0.9 & 0.8 -> reasonable to revascularize given symptoms)   Diverticulosis    Ectatic thoracic aorta (HCC) 07/2018   Follow-up CTA chest 07/27/2021: Ascending aortic dilation 4.2 cm.  No change.   GASTROESOPHAGEAL REFLUX DISEASE 05/18/2007   Qualifier: Diagnosis of  By: Yetta Barre RN, CGRN, Sheri     HEADACHE,  CHRONIC 05/18/2007   Qualifier: Diagnosis of  By: Yetta Barre  RN, CGRN, Sheri     Heart murmur    benign    History of degenerative disc disease    HOH (hard of hearing)    slight-bilateral   HSV-2 (herpes simplex virus 2) infection    Per PCP-genital   Hyperlipidemia 05/18/2007   Qualifier: Diagnosis of  By: Yetta Barre RN, CGRN, Sheri     Hyperlipidemia due to dietary fat intake    Hypertension    IBS (irritable bowel syndrome)    Incidental lung nodule, > 3mm and < 8mm 2019   Stable is a 2022   Internal hemorrhoids with other complication 09/11/2013   Irritable bowel syndrome 05/18/2007   Qualifier: Diagnosis of  By: Yetta Barre RN, CGRN, Sheri     Lyme disease    OSA (obstructive sleep apnea)    better s/p UPP-no problems now   Personal history of adenomatous colonic polyps 05/18/2007   2001, 2004, 2006, 2009 (last with one small adenoma) Max 4 polyps in past with largest 7 mm (both in 2004)  09/11/2013 7 small polyps adenomas  02/2017 5 adeoimas max 10 mm repeat colon 03/2020     PONV (postoperative nausea and vomiting)    Prostatitis    PROSTATITIS, CHRONIC 05/18/2007   Qualifier: Diagnosis of  By: Yetta Barre RN, CGRN, Sheri     Radiculopathy of cervical spine    Small intestinal bacterial overgrowth (SIBO) ? 08/07/2022   Syncope 05/2008   1 episode-evaluated by cardiologist-negative findings-?related to low B/p (hydration tends to help).   Testicular hypofunction    Vitamin D deficiency     Family History  Problem Relation Age of Onset   Breast cancer Mother 2   Heart failure Father 56   Bowel Disease Father 43       Bowel obstruction   CAD Brother 60       PCI   Heart attack Brother 50   CAD Brother 31       PCI   Hepatitis B Brother    Heart attack Maternal Grandfather    Congestive Heart Failure Maternal Grandfather    COPD Maternal Grandfather    Heart disease Other        grandfather/uncle   Alcoholism Other        greatgrandfather/uncle     Colon polyps Other        uncle    Bipolar disorder Son    Ehlers-Danlos syndrome Son    Migraines Son    Colon cancer Neg Hx    Esophageal cancer Neg Hx    Rectal cancer Neg Hx    Stomach cancer Neg Hx     Social History   Socioeconomic History   Marital status: Married    Spouse name: Not on file   Number of children: 2   Years of education: Not on file   Highest education level: Master's degree (e.g., MA, MS, MEng, MEd, MSW, MBA)  Occupational History   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  Tobacco Use   Smoking status: Former    Packs/day: 0.50    Years: 7.00    Additional pack years: 0.00    Total pack years: 3.50    Types: Cigarettes    Start date: 03/07/1977    Quit date: 07/06/1980    Years since quitting: 42.1   Smokeless tobacco: Never  Vaping Use   Vaping Use: Never used  Substance and Sexual Activity   Alcohol use: No  Drug use: No   Sexual activity: Yes    Partners: Female  Other Topics Concern   Not on file  Social History Narrative   Married father of 31 sons   74 year old son graduated Therapist, sports & Lives at home with parents (bipolar)   103 year old son went to [employer] then [employer] as of May 2021, living at home       He has a MA in Psychologist, educational - & teaches Art @ [employer]   Social Determinants of Health   Financial Resource Strain: Not on file  Food Insecurity: Not on file  Transportation Needs: Not on file  Physical Activity: Not on file  Stress: Not on file  Social Connections: Not on file  Intimate Partner Violence: Not on file    Past Medical History, Surgical history, Social history, and Family history were reviewed and updated as appropriate.   Jake dx bipolar on lithium with tremor.  Answered questions about this.  Please see review of systems for further details on the patient's review from today.   Objective:   Physical Exam:  There were no vitals taken for this visit.  Physical Exam Constitutional:       General: He is not in acute distress.    Appearance: He is well-developed.  Musculoskeletal:        General: No deformity.  Neurological:     Mental Status: He is alert and oriented to person, place, and time.     Motor: No tremor.     Coordination: Coordination normal.     Gait: Gait normal.  Psychiatric:        Attention and Perception: He is inattentive. He does not perceive auditory hallucinations.        Mood and Affect: Mood is anxious. Mood is not depressed. Affect is not labile, blunt, angry, tearful or inappropriate.        Speech: Speech normal.        Behavior: Behavior normal.        Thought Content: Thought content normal. Thought content is not delusional. Thought content does not include homicidal or suicidal ideation. Thought content does not include suicidal plan.        Cognition and Memory: Cognition normal.        Judgment: Judgment normal.     Comments: Insight intact. No auditory or visual hallucinations.  Irritability controlled    Get's SOB walking from one building to the next.  Lab Review:     Component Value Date/Time   NA 137 06/20/2022 0831   K 4.7 06/20/2022 0831   CL 99 06/20/2022 0831   CO2 26 06/20/2022 0831   GLUCOSE 93 06/20/2022 0831   GLUCOSE 103 (H) 09/12/2017 0556   BUN 29 (H) 06/20/2022 0831   CREATININE 1.20 06/20/2022 0831   CALCIUM 10.0 06/20/2022 0831   PROT 6.8 06/20/2022 0831   ALBUMIN 4.4 06/20/2022 0831   AST 28 06/20/2022 0831   ALT 34 06/20/2022 0831   ALKPHOS 50 06/20/2022 0831   BILITOT 0.4 06/20/2022 0831   GFRNONAA >60 09/12/2017 0556   GFRAA >60 09/12/2017 0556       Component Value Date/Time   WBC 10.8 06/20/2022 0831   WBC 8.7 09/12/2017 0556   RBC 4.64 06/20/2022 0831   RBC 4.62 09/12/2017 0556   HGB 14.5 06/20/2022 0831   HCT 42.6 06/20/2022 0831   PLT 219 06/20/2022 0831   MCV 92 06/20/2022 0831   MCH 31.3 06/20/2022 0831  MCH 31.4 09/12/2017 0556   MCHC 34.0 06/20/2022 0831   MCHC 33.2 09/12/2017  0556   RDW 13.2 06/20/2022 0831   LYMPHSABS 2.1 09/26/2014 1543   MONOABS 0.6 09/26/2014 1543   EOSABS 0.5 09/26/2014 1543   BASOSABS 0.0 09/26/2014 1543    No results found for: "POCLITH", "LITHIUM"   No results found for: "PHENYTOIN", "PHENOBARB", "VALPROATE", "CBMZ"   .res Assessment: Plan:    Generalized anxiety disorder  Attention deficit hyperactivity disorder (ADHD), predominantly inattentive type  PTSD (post-traumatic stress disorder)  Insomnia due to mental condition   60 min face to face time with patient was spent on counseling and coordination of care. We discussed the change and benefit from the increase in the Lexapro for the anxiety to 7.5 mg daily.  This is the maximum tolerated dosage previously.  Suspect residual anxiety is still a portion of what he's dealing with, but he's failed multiple meds for anxiety.  He's not sig depressed at this time.  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   Benefit from the Adderall for focus XR 20 mg BID.  Satisfied with current dose   He doesn't want changes in that.  Doubt he would get adequate duration with once daily Adderall XR.  Disc way to figure out if it contributes to sweating. Discussed potential benefits, risks, and side effects of stimulants with patient to include increased heart rate, palpitations, insomnia, increased anxiety, increased irritability, or decreased appetite.  Instructed patient to contact office if experiencing any significant tolerability issues. He is discussing cardiac issues and stimulants with cardiologist.   Sleep benefit from the Seroquel 75 mg is worth it. Plenty of sleep  Disc potential chronic fatigue is related to a post-viral syndrome.  Disc metoprolol can cause tiredness.  45 min counseling about his anxiety over his heart health and reviewed the lab data and notes from card about this with him and how to  interpret.  Disc gettting 3rd opinion bc current data is suggesting CABG but this is extensive surgery with long recovery and disc risks it may not resolve sx that have been present fort many years.  `   Disc value of vitamin D for mental health and suggest trial of  5000U daily for the winter to see energy is a little better.  rec increase lexapro to 10 mg for ongoing anxiety.  He agrees.  FU 6 mos  Meredith Staggers, MD, DFAPA     Please see After Visit Summary for patient specific instructions.  Future Appointments  Date Time Provider Department Center  10/19/2022  4:30 PM Cottle, Steva Ready., MD [facility] None     No orders of the defined types were placed in this encounter.     -------------------------------

## 2022-09-07 ENCOUNTER — Encounter: Payer: Self-pay | Admitting: Pharmacist Clinician (PhC)/ Clinical Pharmacy Specialist

## 2022-09-07 ENCOUNTER — Other Ambulatory Visit: Payer: Self-pay | Admitting: Psychiatry

## 2022-09-07 DIAGNOSIS — F4310 Post-traumatic stress disorder, unspecified: Secondary | ICD-10-CM

## 2022-09-07 DIAGNOSIS — F5105 Insomnia due to other mental disorder: Secondary | ICD-10-CM

## 2022-09-14 ENCOUNTER — Other Ambulatory Visit: Payer: Self-pay | Admitting: Cardiology

## 2022-09-14 NOTE — Telephone Encounter (Signed)
Pharmacy is requesting Amlodipine 5mg . Per office note in epic patient is taking Amlodipine 10 mg. Please advise of the correct dosage. Thank you

## 2022-09-15 ENCOUNTER — Encounter: Payer: Self-pay | Admitting: Cardiology

## 2022-09-18 NOTE — Telephone Encounter (Signed)
I have not hear of Robotic Assisted PCI (have heard of Robotic Assisted CABG).   I am not really sure how much more data we will get from checking a Stress PET than we got in the Cath Lab, but we can order one -- there is a bit of a wait to get them done.  Would need to have him seen either in person or virtually to discuss Informed Consent for Stress PET.    My concern prior to doing invasive assessment was that that angiographically I did not see a "culprit" lesion to fix & perhaps Microvascular Ischemia could be causing his symptoms -- but with invasive evaluation, it would appear that long tapering of the LAD does lead to enough reduction in flow to be significant.  I do think that revascularization of the LAD either with a LONG Stented segment or CABG will treat the LAD ischemia seen in the Cath Lab FFR.    Stress PET would hopefully correlate with invasive findings.  We would need to discuss Risk/Benefits etc, of Stress Test - preferentially Lexiscan Stress, as guidelines require Informed Consent.  DH

## 2022-09-26 ENCOUNTER — Other Ambulatory Visit: Payer: Self-pay | Admitting: Pharmacist Clinician (PhC)/ Clinical Pharmacy Specialist

## 2022-10-03 ENCOUNTER — Telehealth: Payer: Self-pay | Admitting: Pharmacy Technician

## 2022-10-03 NOTE — Telephone Encounter (Addendum)
 Aaron Stout, Patient was approved and will be scheduled as soon as possible.  Auth Submission: APPROVED Site of care: Site of care: [facility] Payer: [employer] Medication & CPT/J Code(s) submitted: Leqvio  (Inclisiran) J1306 Route of submission (phone, fax, portal):  Phone # [phone] EXT [phone] Fax #[phone] Auth type: Buy/Bill PB Units/visits requested: 3 DOSES Reference number: [id number] Approval from: 10/03/22 to 10/02/23   Co-pay card: approved:   Id: [id number]

## 2022-10-05 ENCOUNTER — Telehealth: Payer: Self-pay | Admitting: Psychiatry

## 2022-10-05 MED ORDER — ESCITALOPRAM OXALATE 10 MG PO TABS: 10.0000 mg | ORAL_TABLET | Freq: Every day | ORAL | 1 refills | Status: DC

## 2022-10-05 NOTE — Telephone Encounter (Signed)
Patient called in regarding Lexapro prescription. States that he was told to call when he was ready change dosage to 10mg  and he is ready to do so. Ph: [phone] Appt 8/14 Pharmacy [facility] [address] [place]

## 2022-10-05 NOTE — Telephone Encounter (Signed)
Rx sent for 10 mg Lexapro, patient notified.

## 2022-10-10 ENCOUNTER — Ambulatory Visit: Payer: No Typology Code available for payment source

## 2022-10-10 VITALS — BP 137/79 | HR 59 | Temp 98.6°F | Resp 18 | Ht 74.0 in | Wt 232.0 lb

## 2022-10-10 DIAGNOSIS — E785 Hyperlipidemia, unspecified: Secondary | ICD-10-CM

## 2022-10-10 MED ORDER — INCLISIRAN SODIUM 284 MG/1.5ML ~~LOC~~ SOSY
284.0000 mg | PREFILLED_SYRINGE | Freq: Once | SUBCUTANEOUS | Status: AC
  Administered 2022-10-10: 284 mg via SUBCUTANEOUS
  Filled 2022-10-10: qty 1.5

## 2022-10-10 NOTE — Progress Notes (Signed)
Diagnosis: Hyperlipidemia  Provider:  Chilton Greathouse MD  Procedure: Injection  Leqvio (inclisiran), Dose: 284 mg, Site: subcutaneous, Number of injections: 1  Post Care: Observation period completed. Left arm injection  Discharge: Condition: Good, Destination: Home . AVS Provided  Performed by:  Rico Ala, LPN

## 2022-10-10 NOTE — Patient Instructions (Signed)
Inclisiran Injection What is this medication? INCLISIRAN (in kli SIR an) treats high cholesterol. It works by decreasing bad cholesterol (such as LDL) in your blood. Changes to diet and exercise are often combined with this medication. This medicine may be used for other purposes; ask your health care provider or pharmacist if you have questions. COMMON BRAND NAME(S): LEQVIO What should I tell my care team before I take this medication? They need to know if you have any of these conditions: An unusual or allergic reaction to inclisiran, other medications, foods, dyes, or preservatives Pregnant or trying to get pregnant Breast-feeding How should I use this medication? This medication is injected under the skin. It is given by your care team in a hospital or clinic setting. Talk to your care team about the use of this medication in children. Special care may be needed. Overdosage: If you think you have taken too much of this medicine contact a poison control center or emergency room at once. NOTE: This medicine is only for you. Do not share this medicine with others. What if I miss a dose? Keep appointments for follow-up doses. It is important not to miss your dose. Call your care team if you are unable to keep an appointment. What may interact with this medication? Interactions are not expected. This list may not describe all possible interactions. Give your health care provider a list of all the medicines, herbs, non-prescription drugs, or dietary supplements you use. Also tell them if you smoke, drink alcohol, or use illegal drugs. Some items may interact with your medicine. What should I watch for while using this medication? Visit your care team for regular checks on your progress. Tell your care team if your symptoms do not start to get better or if they get worse. You may need blood work while you are taking this medication. What side effects may I notice from receiving this  medication? Side effects that you should report to your care team as soon as possible: Allergic reactions--skin rash, itching, hives, swelling of the face, lips, tongue, or throat Side effects that usually do not require medical attention (report these to your care team if they continue or are bothersome): Joint pain Pain, redness, or irritation at injection site This list may not describe all possible side effects. Call your doctor for medical advice about side effects. You may report side effects to FDA at 1-800-FDA-1088. Where should I keep my medication? This medication is given in a hospital or clinic. It will not be stored at home. NOTE: This sheet is a summary. It may not cover all possible information. If you have questions about this medicine, talk to your doctor, pharmacist, or health care provider.  2024 [employer]vier/Gold Standard (2021-09-17 00:00:00)

## 2022-10-17 HISTORY — PX: NM MYOVIEW LTD: HXRAD82

## 2022-10-19 ENCOUNTER — Ambulatory Visit: Payer: No Typology Code available for payment source | Admitting: Psychiatry

## 2022-10-29 ENCOUNTER — Other Ambulatory Visit: Payer: Self-pay | Admitting: Psychiatry

## 2022-11-17 ENCOUNTER — Telehealth: Payer: Self-pay | Admitting: Psychiatry

## 2022-11-17 NOTE — Telephone Encounter (Signed)
Pt LVM 9/11/ @ 9:24a.  He said he can't find Adderall anywhere.  He wants to know if Dr Jennelle Human will send a substitute.  Next appt 9/24

## 2022-11-18 NOTE — Telephone Encounter (Signed)
Medication has been being prescribed by Dr. Martha Clan. We last prescribed in February. LVM to [facility].

## 2022-11-29 ENCOUNTER — Encounter: Payer: Self-pay | Admitting: Psychiatry

## 2022-11-29 ENCOUNTER — Telehealth: Payer: Self-pay | Admitting: *Deleted

## 2022-11-29 ENCOUNTER — Ambulatory Visit: Payer: No Typology Code available for payment source | Admitting: Psychiatry

## 2022-11-29 ENCOUNTER — Telehealth: Payer: Self-pay | Admitting: Cardiology

## 2022-11-29 DIAGNOSIS — F4310 Post-traumatic stress disorder, unspecified: Secondary | ICD-10-CM

## 2022-11-29 DIAGNOSIS — F411 Generalized anxiety disorder: Secondary | ICD-10-CM | POA: Diagnosis not present

## 2022-11-29 DIAGNOSIS — F900 Attention-deficit hyperactivity disorder, predominantly inattentive type: Secondary | ICD-10-CM | POA: Diagnosis not present

## 2022-11-29 DIAGNOSIS — F5105 Insomnia due to other mental disorder: Secondary | ICD-10-CM

## 2022-11-29 MED ORDER — ADZENYS XR-ODT 12.5 MG PO TBED
12.5000 mg | EXTENDED_RELEASE_TABLET | Freq: Two times a day (BID) | ORAL | 0 refills | Status: DC
Start: 2022-11-29 — End: 2023-05-30

## 2022-11-29 MED ORDER — ESCITALOPRAM OXALATE 10 MG PO TABS: 10.0000 mg | ORAL_TABLET | Freq: Every day | ORAL | 1 refills | Status: DC

## 2022-11-29 NOTE — Progress Notes (Signed)
Aaron Stout [id number] 1955-07-04 67 y.o.  Subjective:   Patient ID:  Aaron Stout is a 67 y.o. (DOB 07-01-1955) male.  Chief Complaint:  Chief Complaint  Patient presents with   Follow-up    Mood , anxiety, ADD, sleep    Depression        Associated symptoms include decreased concentration and fatigue.  Associated symptoms include no suicidal ideas.  Past medical history includes anxiety.   Anxiety Symptoms include chest pain, decreased concentration, nervous/anxious behavior and shortness of breath. Patient reports no confusion, dizziness, palpitations or suicidal ideas.     Aaron Stout presents to the office today for follow-up of mood and anxiety and sleep.  At visit in 2020had residual anxiety and it was suggested he disc with cardiologist the use of a beta blocker metoprolol and it helped BP and  anxiety DT multiple failed anxiety meds.  Has been generally good.    At  visit 10/27/19  Lexapro was increased from 5 to 7.5mg  dailty for anxiety which is chronic. Thinks the anxiety is a helpful.  I  Chronic anxiety and stress at work.  Evaluations pending at work.  Less obsessing about that.  Has tried 10mg  Lexapro and he feels it was too much.  visit October 2020 without med changes.    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   Still CO sweating easily. Pretty good overall.  No major changes since here.  Same old chronic tiredness and easily OOB.  Still a little anxiety hangs on.  Not horrible and able to function and keep on going.    12/19/2019 appointment with the following noted: Fine overall.  Generally steady with mood and other factors. Chronic tiredness without change.  Trying to manage stress as much as possible. Stress Hudson back in the house and some lability continues with anger outbursts.  He always finds someone at work he doesn't like and starts ruminating on it. SE mild hangover. Mild  hangover with Seroquel which helps the sleep. Makes him sleepy in 60-90 mins. Chronic sweating and SOB. Plan: No med changes  06/18/2020 appointment with following noted: No Covid.   Knee surgery 12/21. Otherwise health is good.    02/25/21 appt noted: Taking lexapro 7.5 mg , Adderall and quetiapine 75 mg HS. No SE.  Sleep pretty good. Tiredness worse in the day.  08/26/21  appt noted:  HT at [facility] ph has the Adderall. Benfit Adderall No SE  Overll doing ok with mood and anxity. Pt reports that mood is occ irritable and Anxious and describes anxiety as mild-moderate like if too many things coming at him at once.   Anxiety symptoms include: Excessive Worry,. Pt reports no sleep issues. Pt reports that appetite is good. Pt reports that energy is good and good. Concentration is good. Suicidal thoughts:  denied by patient Plan: no med changes  03/03/22 appt noted: Steady overall.   Still some hangover effects from Seroquel.   The HT version of Adderall is less effective than the [facility] version but supply problem. Recent dx angina with microvascular Dz.  On a couple of meds for that.  Cardiology aware he's taking Adderall XR 20 BID. Saw cardiology last week.  Just started Imdur and so far less chest pain.   No SE otherwise and satisfied with meds. Plan: No med changes. 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  08/18/2022 appointment noted: Meds as above Some SOB and CP with 2nd opinion as to cause.  No clear answer yet.   Asked question as to how to deal with differences between first and second opinion.   Is in some turmoil bc of the uncertainty as to what to do.  Some anxiety over this health concern.   No indication for med changes. Plan: rec increase lexapro to 10 mg for ongoing anxiety.  He agrees.  11/29/22 appt noted: Hasn't been able to get Adderall XR 20 mg BID in last couple  of weeks. Meds: Lexapro 10 mg daily, quetiapine 25 mg 3 tabs at night., out of Adderall  Was getting Adderall XR from Dr. Clelia Croft bc easier to get from him than this office.   Still feels a little tired without it.  Focus has suffered some as well.   Wiped out by the day more.  Harder to focus when gets home.   Was satisfied with Adderall XR 20 BID  No problem with increasing Lexapro 10 mg daily and thinks it was helpful.  Had neg stress test but he questions the results.  Has a question about it.  Doesn't trust the results.  BC he still has the CP and SOB sx.  SX probably a little more pronounced even off the Adderall. Has sciatica but didn't tolerate prednisone trial bc felt wiped out by it and it caused more chest pain and SOB when working.     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  He's aware and tries to catch it.    Past Psychiatric Medication Trials: Lexapro 10 SE, Zoloft,  Emsam,  Welllbutrin Adderall XR20 BID,  Ritalin , Concerta, Vyvanse, seroquel 75 for sleep, perphenazine, Depakote,  Vraylar 1.5, lithium 600, Trileptal, Buspar dizzy, lamotrigine,  Belsomra,   Review of Systems:  Review of Systems  Constitutional:  Positive for fatigue.       Long history of sweating  Respiratory:  Positive for shortness of breath.   Cardiovascular:  Positive for chest pain. Negative for palpitations.  Musculoskeletal:  Positive for joint swelling.  Neurological:  Negative for dizziness and tremors.  Psychiatric/Behavioral:  Positive for decreased concentration. Negative for agitation, behavioral problems, confusion, dysphoric mood, hallucinations, self-injury, sleep disturbance and suicidal ideas. The patient is nervous/anxious. The patient is not hyperactive.     Medications: I have reviewed the patient's current medications.  Current Outpatient Medications  Medication Sig Dispense Refill   albuterol (PROVENTIL HFA;VENTOLIN HFA)  108 (90 Base) MCG/ACT inhaler Inhale 2 puffs into the lungs 2 (two) times daily. 1 Inhaler 3   amLODipine (NORVASC) 10 MG tablet TAKE 1 TABLET BY MOUTH EVERY DAY 90 tablet 7   Amphetamine ER (ADZENYS XR-ODT) 12.5 MG TBED Take 12.5 mg by mouth 2 (two) times daily. 60 tablet 0   Ascorbic Acid (VITAMIN C WITH ROSE HIPS) 1000 MG tablet Take 1,000 mg by mouth in the morning and at bedtime.     aspirin EC 81 MG tablet Take 1 tablet (81 mg total) by mouth daily. Swallow whole. 32 tablet 0   azelastine (ASTELIN) 0.1 % nasal spray Place 1 spray into both nostrils as needed for rhinitis. Use in each nostril as directed     B-12, Methylcobalamin, 1000 MCG SUBL Place 1,000 mcg under the tongue every evening.     bisoprolol (ZEBETA) 5 MG  tablet Take 0.5 tablets (2.5 mg total) by mouth daily. 45 tablet 3   Cholecalciferol (VITAMIN D) 2000 UNITS tablet Take 3,000 Units by mouth daily.      clotrimazole-betamethasone (LOTRISONE) cream Apply 1 application topically 2 (two) times daily as needed (for psoriasis).     Coenzyme Q10 (CO Q-10) 100 MG CAPS Take 100 mg by mouth daily.     colestipol (COLESTID) 5 g packet Take 10 g by mouth daily. (Patient taking differently: Take 7.5 g by mouth every evening.) 60 packet 5   DHEA 25 MG CAPS Take 25 mg by mouth daily.      fexofenadine (ALLEGRA) 180 MG tablet Take 90 mg by mouth at bedtime.     fluticasone (FLONASE) 50 MCG/ACT nasal spray Place 1 spray into both nostrils as needed for allergies or rhinitis.     montelukast (SINGULAIR) 10 MG tablet Take 10 mg by mouth daily.     Multiple Vitamin (MULTIVITAMIN) tablet Take 1 tablet by mouth daily.     nystatin cream (MYCOSTATIN) Apply 1 Application topically as needed for dry skin.     Omega-3 Fatty Acids (FISH OIL) 1000 MG CAPS Take 2,000 mg by mouth 2 (two) times daily.      Omeprazole-Sodium Bicarbonate (ZEGERID) 20-1100 MG CAPS capsule Take 1 capsule by mouth 2 (two) times daily.      polycarbophil (FIBERCON) 625 MG  tablet Take 5,000 mg by mouth daily.     Pregnenolone 50 MG TABS Take 100 mg by mouth daily.     QUERCETIN PO Take 1,000 mg by mouth in the morning and at bedtime.     QUEtiapine (SEROQUEL) 25 MG tablet TAKE 3 TABLETS BY MOUTH AT BEDTIME. 270 tablet 1   Turmeric 500 MG CAPS Take 500 mg by mouth 2 (two) times daily. With Black Pepper     valACYclovir (VALTREX) 500 MG tablet Take 500 mg by mouth 2 (two) times daily.      WIXELA INHUB 100-50 MCG/ACT AEPB Inhale 1 puff into the lungs 2 (two) times daily.     amphetamine-dextroamphetamine (ADDERALL XR) 20 MG 24 hr capsule Take 1 capsule (20 mg total) by mouth 2 (two) times daily. (Patient not taking: Reported on 11/29/2022) 60 capsule 0   escitalopram (LEXAPRO) 10 MG tablet Take 1 tablet (10 mg total) by mouth daily. 90 tablet 1   No current facility-administered medications for this visit.    Medication Side Effects: None ? sweating  Allergies:  Allergies  Allergen Reactions   Duloxetine Hcl Other (See Comments)    Drowsiness  *Cymbalta   Ibuprofen Hives   Voltaren [Diclofenac] Shortness Of Breath    Also had palpitations   Penicillins Other (See Comments)    Unknown Has patient had a PCN reaction causing immediate rash, facial/tongue/throat swelling, SOB or lightheadedness with hypotension: Unknown Has patient had a PCN reaction causing severe rash involving mucus membranes or skin necrosis: Unknown Has patient had a PCN reaction that required hospitalization: Unknown Has patient had a PCN reaction occurring within the last 10 years: No If all of the above answers are "NO", then may proceed with Cephalosporin use.  Childhood response    Dilaudid [Hydromorphone Hcl] Nausea And Vomiting   Simvastatin Other (See Comments)    Cramps    Past Medical History:  Diagnosis Date   Adenomatous colon polyp 2001   ALLERGIC RHINITIS 05/18/2007   Qualifier: Diagnosis of  By: Yetta Barre RN, CGRN, Sheri     Anxiety  Arthritis 2013    arthritis-hands; Per PCPs notes-inflammatory arthritis   ASTHMA 05/18/2007   Qualifier: Diagnosis of  By: Yetta Barre RN, CGRN, Sheri     Bipolar 1 disorder (HCC)    With depression   Cancer (HCC)    hx skin cancer   Chest tightness    Chronic fatigue syndrome    Chronic headaches    d/t old neck injury   Complication of anesthesia    x1 episode "panic attack" awakening-none in recent years   Coronary artery disease involving native coronary artery of native heart with other form of angina pectoris (HCC) 06/2022   Cardiac catheter revealed ostial mid LAD 40% stenosis followed by a long segment after D1/SP1 all the way to SP2 - RFR 0.83 (physiologically significant ); tandem Prox & Mid Cx ~70% ~borderline RFR/GFR (0.9 & 0.8 -> reasonable to revascularize given symptoms)   Diverticulosis    Ectatic thoracic aorta (HCC) 07/2018   Follow-up CTA chest 07/27/2021: Ascending aortic dilation 4.2 cm.  No change.   GASTROESOPHAGEAL REFLUX DISEASE 05/18/2007   Qualifier: Diagnosis of  By: Yetta Barre RN, CGRN, Sheri     HEADACHE, CHRONIC 05/18/2007   Qualifier: Diagnosis of  By: Yetta Barre RN, CGRN, Sheri     Heart murmur    benign    History of degenerative disc disease    HOH (hard of hearing)    slight-bilateral   HSV-2 (herpes simplex virus 2) infection    Per PCP-genital   Hyperlipidemia 05/18/2007   Qualifier: Diagnosis of  By: Yetta Barre RN, CGRN, Sheri     Hyperlipidemia due to dietary fat intake    Hypertension    IBS (irritable bowel syndrome)    Incidental lung nodule, > 3mm and < 8mm 2019   Stable is a 2022   Internal hemorrhoids with other complication 09/11/2013   Irritable bowel syndrome 05/18/2007   Qualifier: Diagnosis of  By: Yetta Barre RN, CGRN, Sheri     Lyme disease    OSA (obstructive sleep apnea)    better s/p UPP-no problems now   Personal history of adenomatous colonic polyps 05/18/2007   2001, 2004, 2006, 2009 (last with one small adenoma) Max 4 polyps in past with largest 7 mm (both in  2004)  09/11/2013 7 small polyps adenomas  02/2017 5 adeoimas max 10 mm repeat colon 03/2020     PONV (postoperative nausea and vomiting)    Prostatitis    PROSTATITIS, CHRONIC 05/18/2007   Qualifier: Diagnosis of  By: Yetta Barre RN, CGRN, Sheri     Radiculopathy of cervical spine    Small intestinal bacterial overgrowth (SIBO) ? 08/07/2022   Syncope 05/2008   1 episode-evaluated by cardiologist-negative findings-?related to low B/p (hydration tends to help).   Testicular hypofunction    Vitamin D deficiency     Family History  Problem Relation Age of Onset   Breast cancer Mother 60   Heart failure Father 34   Bowel Disease Father 33       Bowel obstruction   CAD Brother 16       PCI   Heart attack Brother 29   CAD Brother 27       PCI   Hepatitis B Brother    Heart attack Maternal Grandfather    Congestive Heart Failure Maternal Grandfather    COPD Maternal Grandfather    Heart disease Other        grandfather/uncle   Alcoholism Other        greatgrandfather/uncle  Colon polyps Other        uncle   Bipolar disorder Son    Ehlers-Danlos syndrome Son    Migraines Son    Colon cancer Neg Hx    Esophageal cancer Neg Hx    Rectal cancer Neg Hx    Stomach cancer Neg Hx     Social History   Socioeconomic History   Marital status: Married    Spouse name: Not on file   Number of children: 2   Years of education: Not on file   Highest education level: Master's degree (e.g., MA, MS, MEng, MEd, MSW, MBA)  Occupational History   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  Tobacco Use   Smoking status: Former    Current packs/day: 0.00    Average packs/day: 0.5 packs/day for 7.0 years (3.5 ttl pk-yrs)    Types: Cigarettes    Start date: 03/07/1977    Quit date: 07/06/1980    Years since quitting: 42.4   Smokeless tobacco: Never  Vaping Use   Vaping status: Never Used  Substance and Sexual Activity   Alcohol use: No   Drug use: No   Sexual  activity: Yes    Partners: Female  Other Topics Concern   Not on file  Social History Narrative   Married father of 64 sons   48 year old son graduated Therapist, sports & Lives at home with parents (bipolar)   46 year old son went to [employer] then [employer] as of May 2021, living at home       He has a MA in Psychologist, educational - & teaches Art @ [employer]   Social Determinants of Health   Financial Resource Strain: Not on file  Food Insecurity: Not on file  Transportation Needs: Not on file  Physical Activity: Not on file  Stress: Not on file  Social Connections: Unknown (07/20/2021)   Received from [facility], [facility]   Social Network    Social Network: Not on file  Intimate Partner Violence: Unknown (06/11/2021)   Received from [facility], [facility]   HITS    Physically Hurt: Not on file    Insult or Talk Down To: Not on file    Threaten Physical Harm: Not on file    Scream or Curse: Not on file    Past Medical History, Surgical history, Social history, and Family history were reviewed and updated as appropriate.   Jake dx bipolar on lithium with tremor.  Answered questions about this.  Please see review of systems for further details on the patient's review from today.   Objective:   Physical Exam:  There were no vitals taken for this visit.  Physical Exam Constitutional:      General: He is not in acute distress.    Appearance: He is well-developed.  Musculoskeletal:        General: No deformity.  Neurological:     Mental Status: He is alert and oriented to person, place, and time.     Motor: No tremor.     Coordination: Coordination normal.     Gait: Gait normal.  Psychiatric:        Attention and Perception: He is inattentive. He does not perceive auditory hallucinations.        Mood and Affect: Mood is anxious. Mood is not depressed. Affect is not labile, blunt, angry, tearful or inappropriate.        Speech: Speech normal.  Behavior:  Behavior normal.        Thought Content: Thought content normal. Thought content is not delusional. Thought content does not include homicidal or suicidal ideation. Thought content does not include suicidal plan.        Cognition and Memory: Cognition normal.        Judgment: Judgment normal.     Comments: Insight intact. No auditory or visual hallucinations.  Irritability controlled    Get's SOB walking from one building to the next.  Lab Review:     Component Value Date/Time   NA 137 06/20/2022 0831   K 4.7 06/20/2022 0831   CL 99 06/20/2022 0831   CO2 26 06/20/2022 0831   GLUCOSE 93 06/20/2022 0831   GLUCOSE 103 (H) 09/12/2017 0556   BUN 29 (H) 06/20/2022 0831   CREATININE 1.20 06/20/2022 0831   CALCIUM 10.0 06/20/2022 0831   PROT 6.8 06/20/2022 0831   ALBUMIN 4.4 06/20/2022 0831   AST 28 06/20/2022 0831   ALT 34 06/20/2022 0831   ALKPHOS 50 06/20/2022 0831   BILITOT 0.4 06/20/2022 0831   GFRNONAA >60 09/12/2017 0556   GFRAA >60 09/12/2017 0556       Component Value Date/Time   WBC 10.8 06/20/2022 0831   WBC 8.7 09/12/2017 0556   RBC 4.64 06/20/2022 0831   RBC 4.62 09/12/2017 0556   HGB 14.5 06/20/2022 0831   HCT 42.6 06/20/2022 0831   PLT 219 06/20/2022 0831   MCV 92 06/20/2022 0831   MCH 31.3 06/20/2022 0831   MCH 31.4 09/12/2017 0556   MCHC 34.0 06/20/2022 0831   MCHC 33.2 09/12/2017 0556   RDW 13.2 06/20/2022 0831   LYMPHSABS 2.1 09/26/2014 1543   MONOABS 0.6 09/26/2014 1543   EOSABS 0.5 09/26/2014 1543   BASOSABS 0.0 09/26/2014 1543    No results found for: "POCLITH", "LITHIUM"   No results found for: "PHENYTOIN", "PHENOBARB", "VALPROATE", "CBMZ"   .res Assessment: Plan:    Generalized anxiety disorder - Plan: escitalopram (LEXAPRO) 10 MG tablet  Attention deficit hyperactivity disorder (ADHD), predominantly inattentive type - Plan: Amphetamine ER (ADZENYS XR-ODT) 12.5 MG TBED  PTSD (post-traumatic stress disorder) - Plan: escitalopram (LEXAPRO)  10 MG tablet  Insomnia due to mental condition   60 min face to face time with patient was spent on counseling and coordination of care. We discussed the change and benefit from the increase in the Lexapro for the anxiety to 7.5 mg daily.  This is the maximum tolerated dosage previously.  Suspect residual anxiety is still a portion of what he's dealing with, but he's failed multiple meds for anxiety.  He's not sig depressed at this time.  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   Benefit from the Adderall for focus XR 20 mg BID but shortage so switch to Adzenys 12.5 mg BID.   Satisfied with current dose   He doesn't want changes in that.  Doubt he would get adequate duration with once daily Adderall XR.  Disc option to try Mydayis for longer duration.  Disc way to figure out if it contributes to sweating. Discussed potential benefits, risks, and side effects of stimulants with patient to include increased heart rate, palpitations, insomnia, increased anxiety, increased irritability, or decreased appetite.  Instructed patient to contact office if experiencing any significant tolerability issues. He is discussing cardiac issues and stimulants with cardiologist.   Had no improvement with sx  when off the stimulants for 2 week.s  Sleep benefit from the Seroquel 75 mg is worth it. Plenty of sleep  20 min counseling about his anxiety over his heart health and reviewed the lab data and notes from card about this with him and how to interpret.  Disc gettting 3rd opinion bc current data is suggesting CABG but this is extensive surgery with long recovery and disc risks it may not resolve sx that have been present fort many years.  `   Disc value of vitamin D for mental health and suggest trial of  5000U daily for the winter to see energy is a little better.  Ok with increase lexapro to 10 mg for ongoing anxiety.  He  agrees.  FU 6 mos  Meredith Staggers, MD, DFAPA     Please see After Visit Summary for patient specific instructions.  Future Appointments  Date Time Provider Department Center  01/10/2023 11:15 AM [facility] None     No orders of the defined types were placed in this encounter.     -------------------------------

## 2022-11-29 NOTE — Telephone Encounter (Signed)
Spoke with Kenney Houseman in File Room in [facility] department at [facility]. She is able to send images on CD to the office. Records request faxed to [phone] as requested. Confirmation received. Per Kenney Houseman, pt should submit request for his personal copy via MyChart.  Spoke with pt to provide update. Encouraged pt to check back in a few weeks to confirm whether records have been received by [facility]. Pt aware to request his personal copy via MyChart. Pt verbalized appreciation of call and assistance.

## 2022-11-29 NOTE — Telephone Encounter (Signed)
See other phone note from 11/29/22.

## 2022-11-29 NOTE — Telephone Encounter (Signed)
Patient came in today and requested his Nuclear stress test on 10/17/22 to be sent from [facility]. He filled out a release of information asking for the IMAGES specifically. Shirlee More is calling [facility] to follow up on request and ask more questions. Patient is aware of this and will be expecting a call with updates. She stated she would call the patient back and we will request the records.

## 2022-11-30 ENCOUNTER — Encounter: Payer: Self-pay | Admitting: Cardiology

## 2022-11-30 NOTE — Telephone Encounter (Signed)
This test looks for what we call "flow limiting blockages" & does not suggest that the blockages seen on Cath are "significant enough Epicardial Coronary Artery" narrowings to cause symptoms.  This test would argue that the Long area of the LAD that was positive in the Cath Lab may be more consistent with long tapering of the vessel as opposed to a definite blockage - which is why I did not stent.    This study argues that if your symptoms are Hear Related at all, they are not related to Epicardial Coronary Artery (Macrovascular) disease -- but more likely related to small vessel (microvascular) disease - which is what I first postulated.   Based upon this Myoview perfusion study - I am confident that Stents or Bypass Grafts would probably not help.   Unfortunately, you have been unable to tolerate medications used to treat this condition.   We have you on a contact list if appointment dates come up, but perhaps you would want to follow up with the Cardiologist in [place] to see what his impression is.    DH

## 2022-12-04 IMAGING — US US THYROID
1 series · 13 of 25 positions shown · non-contrast
Comparison: [DATE]

CLINICAL DATA: Prior ultrasound follow-up.

EXAM:
THYROID ULTRASOUND
TECHNIQUE: Ultrasound examination of the thyroid gland and adjacent soft
tissues was performed.

[Series 1: us thyroid · 0.06mm/px · 13 of 66 slices shown]
[im 1/66]
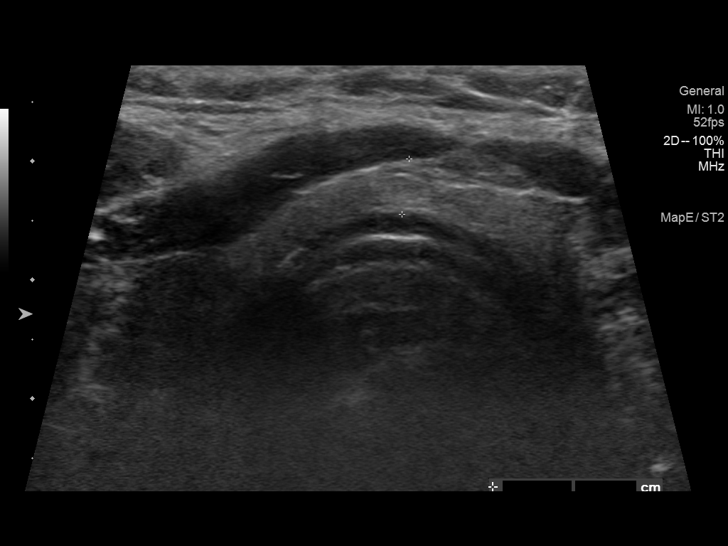
[im 6/66]
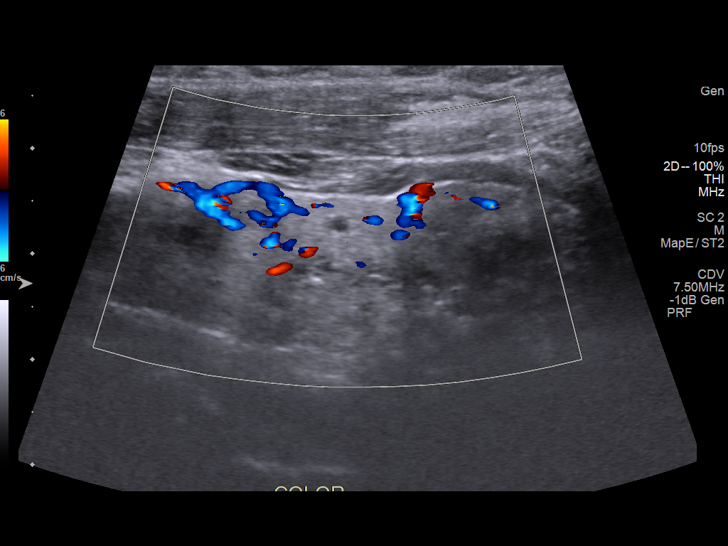
[im 11/66]
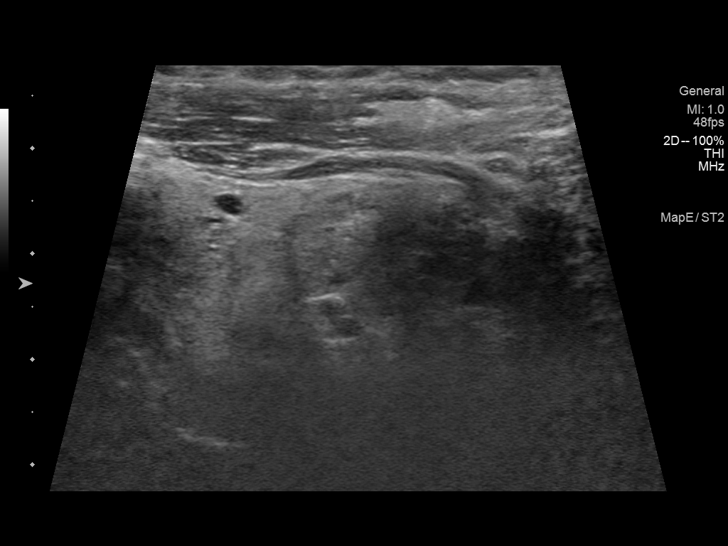
[im 17/66]
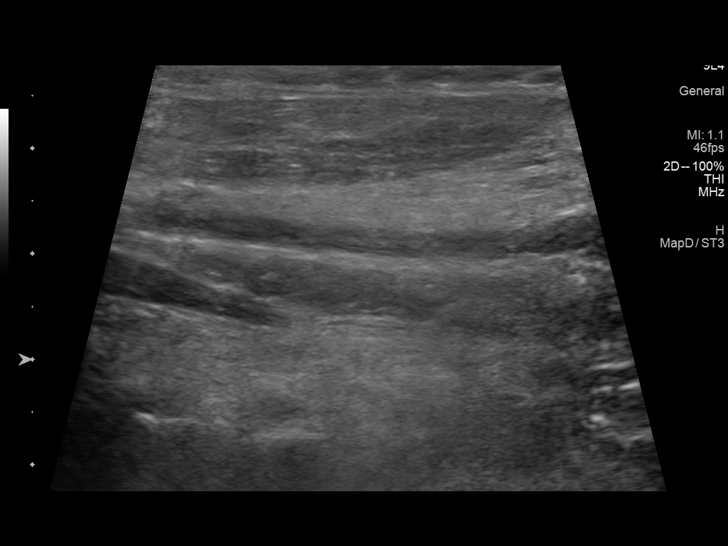
[im 22/66]
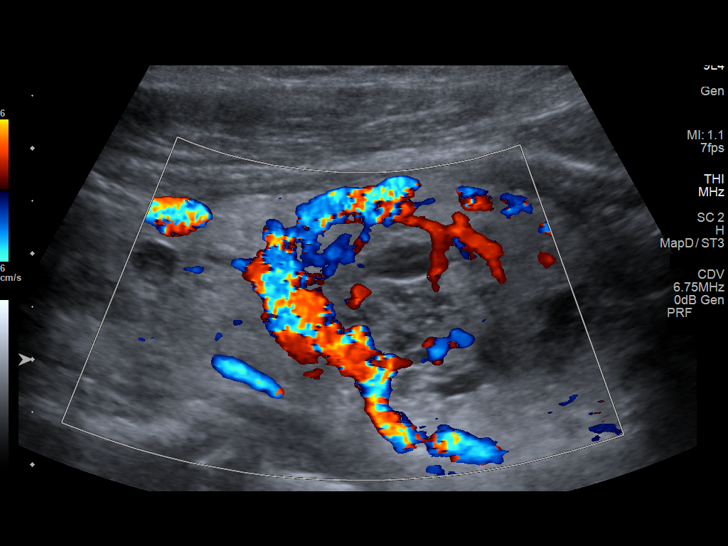
[im 28/66]
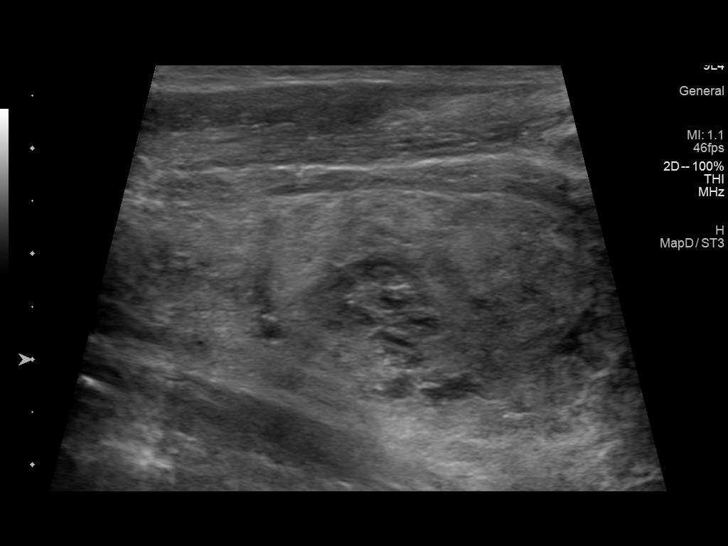
[im 33/66]
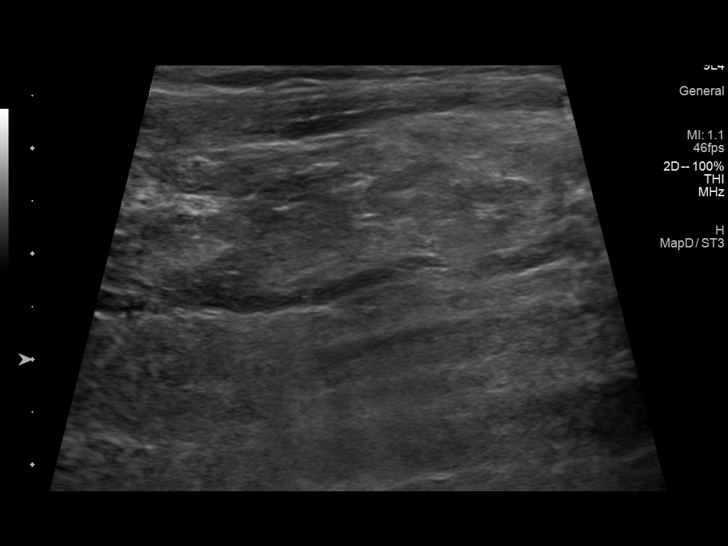
[im 38/66]
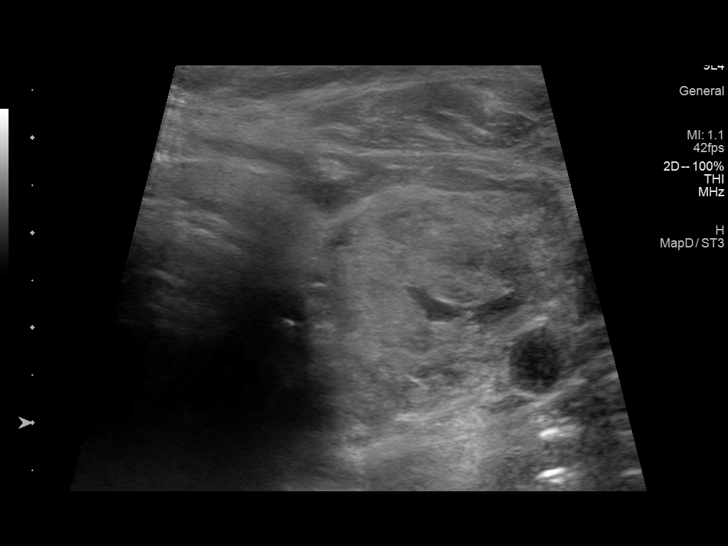
[im 44/66]
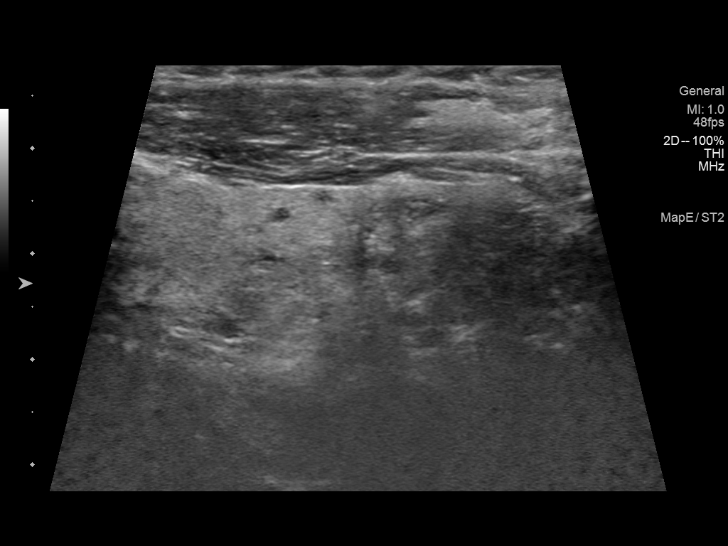
[im 49/66]
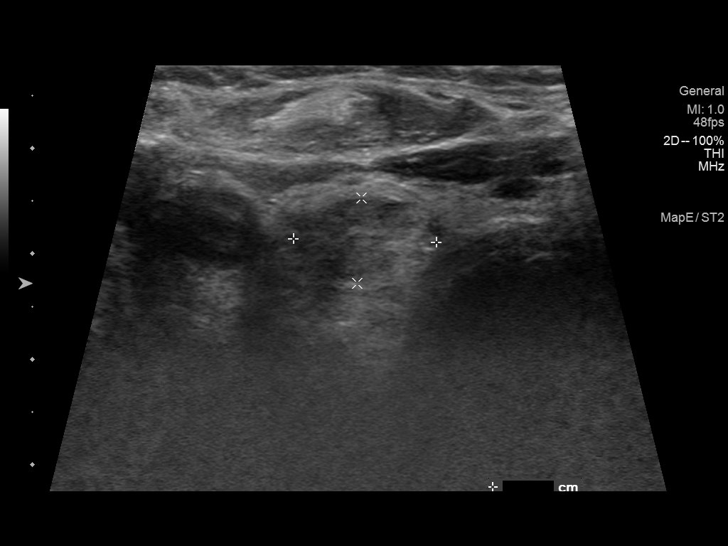
[im 55/66]
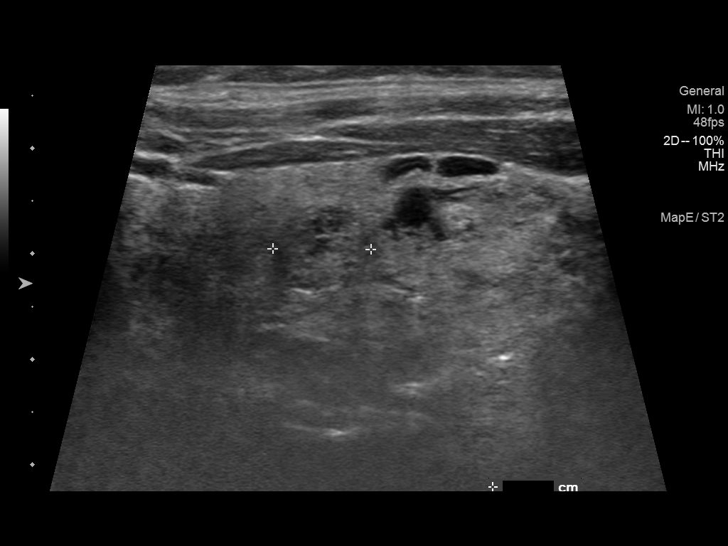
[im 60/66]
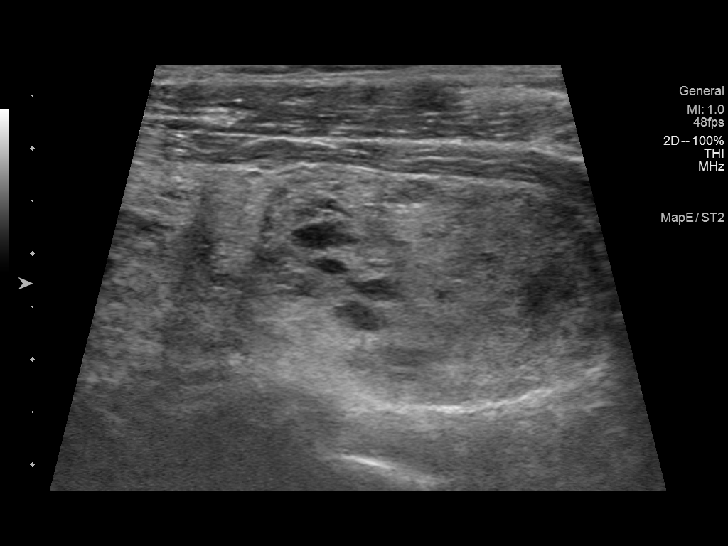
[im 66/66]
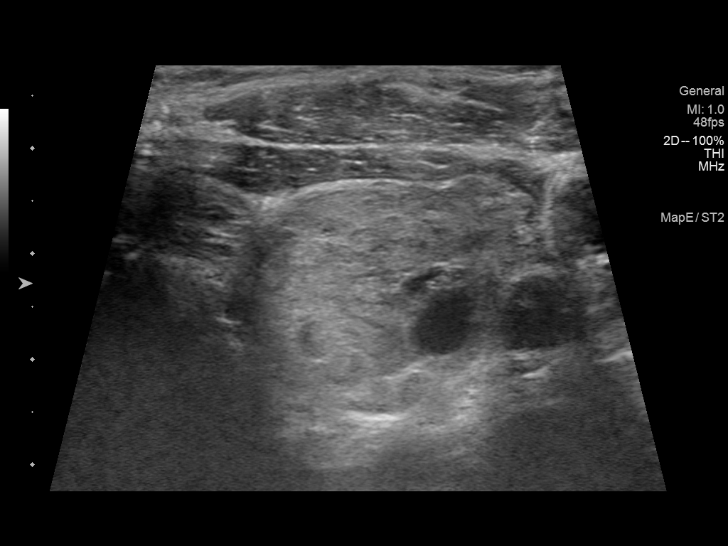

[13 of 25 positions shown; findings below may reference images not displayed]

FINDINGS: Parenchymal Echotexture: Moderately heterogenous

Isthmus: 0.5 cm

Right lobe: 4.8 x 1.5 x 1.9 cm

Left lobe: 5.3 x 2.4 x 2.8 cm

_________________________________________________________

Estimated total number of nodules >/= 1 cm: 3

Number of spongiform nodules >/=  2 cm not described below (TR1): 0

Number of mixed cystic and solid nodules >/= 1.5 cm not described
below (TR2): 0

_________________________________________________________

Nodule labeled 1 is a small subcentimeter solid hypoechoic TR 4
nodule in the superior right thyroid lobe. Given size (<0.9 cm) and
appearance, this nodule does NOT meet TI-RADS criteria for biopsy or
dedicated follow-up.

Nodule labeled 2 is a small solid isoechoic TR 3 nodule in the mid
right thyroid lobe measuring 1 cm in size. Given size (<1.4 cm) and
appearance, this nodule does NOT meet TI-RADS criteria for biopsy or
dedicated follow-up.

Nodule labeled 3 is a solid isoechoic TR 3 nodule in the inferior
right thyroid lobe measuring 1.4 cm in size. Given size (<1.4 cm)
and appearance, this nodule does NOT meet TI-RADS criteria for
biopsy or dedicated follow-up.

Note that the previously described TR 4 nodule in the inferior right
thyroid lobe is not identified on today's exam.

Nodule labeled 4 is a small subcentimeter solid hypoechoic TR 4
nodule in the superior left thyroid lobe. Given size (<0.9 cm) and
appearance, this nodule does NOT meet TI-RADS criteria for biopsy or
dedicated follow-up.

Nodule labeled 5 is a small subcentimeter solid isoechoic TR 3
nodule in the mid left thyroid lobe measuring 0.9 cm. Given size
(<1.4 cm) and appearance, this nodule does NOT meet TI-RADS criteria
for biopsy or dedicated follow-up.

Nodule labeled 6 is a large solid/almost completely solid isoechoic
TR 3 nodule in the mid to inferior left thyroid lobe measuring 3.2 x
2.2 x 1.4 cm, previously measuring up to 3.0 cm. It is not
significantly changed in size or morphology. It was previously
biopsied.

Nodule labeled 7 is a solid isoechoic TR 3 nodule in the inferior
left thyroid lobe measuring 1.2 cm. Given size (<1.4 cm) and
appearance, this nodule does NOT meet TI-RADS criteria for biopsy or
dedicated follow-up.
IMPRESSION: 1. Multinodular thyroid gland.
2. Nodule labeled 6 has been previously biopsied, and does not
appear significantly changed in size or morphology. Correlate with
biopsy results.
3. Numerous other small and/or benign appearing nodules do not meet
criteria for further dedicated follow-up or biopsy. The previously
described 1 cm TR 4 nodule in the inferior right thyroid lobe is not
identified on today's exam.

The above is in keeping with the ACR TI-RADS recommendations - [HOSPITAL] 2337;[DATE].

## 2022-12-10 ENCOUNTER — Other Ambulatory Visit: Payer: Self-pay | Admitting: Psychiatry

## 2022-12-10 DIAGNOSIS — F411 Generalized anxiety disorder: Secondary | ICD-10-CM

## 2022-12-10 DIAGNOSIS — F4310 Post-traumatic stress disorder, unspecified: Secondary | ICD-10-CM

## 2022-12-14 ENCOUNTER — Encounter: Payer: Self-pay | Admitting: Internal Medicine

## 2022-12-15 ENCOUNTER — Other Ambulatory Visit: Payer: Self-pay | Admitting: Internal Medicine

## 2022-12-15 MED ORDER — METRONIDAZOLE 250 MG PO TABS: 250.0000 mg | ORAL_TABLET | Freq: Three times a day (TID) | ORAL | 0 refills | Status: AC

## 2023-01-10 ENCOUNTER — Ambulatory Visit: Payer: No Typology Code available for payment source

## 2023-01-10 VITALS — BP 144/76 | HR 64 | Temp 97.3°F | Resp 20 | Ht 74.0 in | Wt 249.0 lb

## 2023-01-10 DIAGNOSIS — E785 Hyperlipidemia, unspecified: Secondary | ICD-10-CM

## 2023-01-10 MED ORDER — INCLISIRAN SODIUM 284 MG/1.5ML ~~LOC~~ SOSY
284.0000 mg | PREFILLED_SYRINGE | Freq: Once | SUBCUTANEOUS | Status: AC
  Administered 2023-01-10: 284 mg via SUBCUTANEOUS
  Filled 2023-01-10: qty 1.5

## 2023-01-10 NOTE — Progress Notes (Signed)
Diagnosis: Hyperlipidemia  Provider:  Mannam, Praveen MD  Procedure: Injection  Leqvio (inclisiran), Dose: 284 mg, Site: subcutaneous, Number of injections: 1  Post Care: Patient declined observation  Discharge: Condition: Good, Destination: Home . AVS Provided  Performed by:  Starsha Morning, RN       

## 2023-01-25 ENCOUNTER — Other Ambulatory Visit: Payer: Self-pay | Admitting: Pharmacist Clinician (PhC)/ Clinical Pharmacy Specialist

## 2023-01-25 ENCOUNTER — Telehealth: Payer: Self-pay | Admitting: Pharmacist Clinician (PhC)/ Clinical Pharmacy Specialist

## 2023-01-25 DIAGNOSIS — E785 Hyperlipidemia, unspecified: Secondary | ICD-10-CM

## 2023-01-25 NOTE — Telephone Encounter (Signed)
2 doses of leqvio, labs ordered

## 2023-02-20 IMAGING — CT CT ANGIO CHEST
1 series · 19 of 32 positions shown · IV contrast (agent unspecified)
Comparison: 07/31/2018

CLINICAL DATA: Aortic aneurysm, known or suspected. Dyspnea on
exertion. Recent injury to the sternum.

EXAM:
CT ANGIOGRAPHY CHEST WITH CONTRAST
TECHNIQUE: Multidetector CT imaging of the chest was performed using the
standard protocol during bolus administration of intravenous
contrast. Multiplanar CT image reconstructions and MIPs were
obtained to evaluate the vascular anatomy.

[Series 4: thoracic cta 2mm · axial · 0.72mm/px · z∈[-319,-35]mm · 19 of 152 slices shown]
[im 5/152  lung]
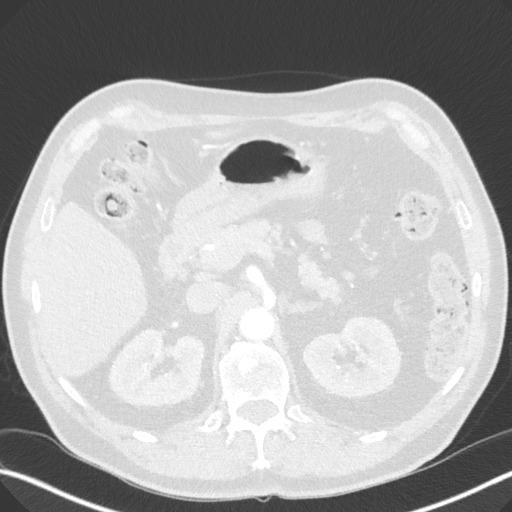
[im 15/152  soft-tissue]
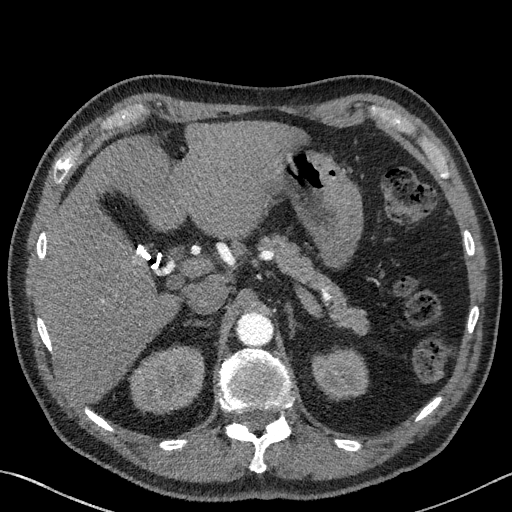
[im 20/152  lung]
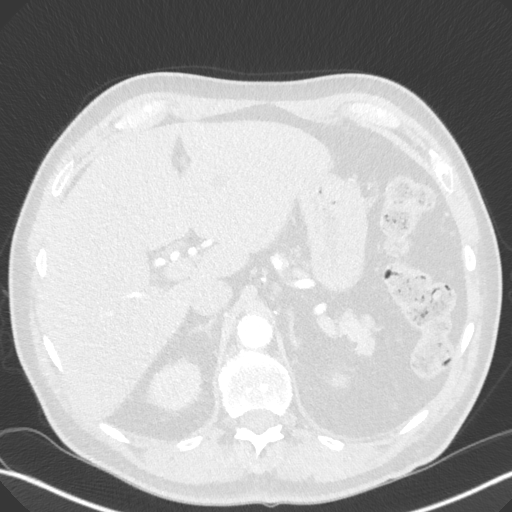
[im 30/152  soft-tissue]
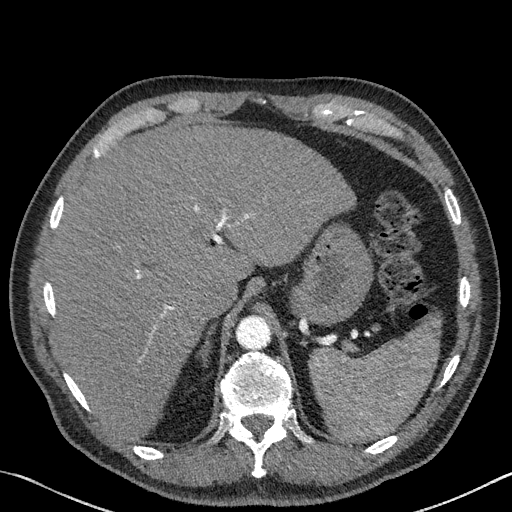
[im 39/152  lung]
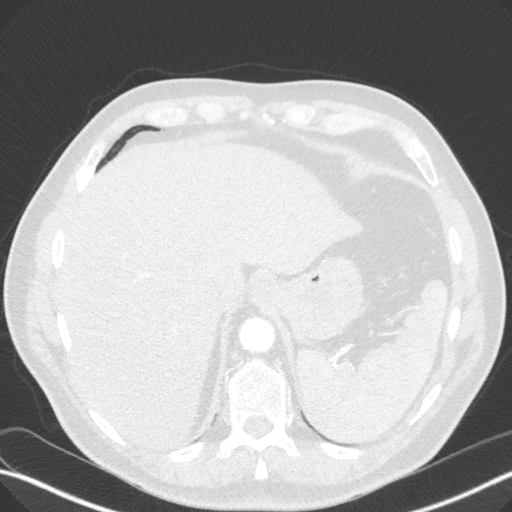
[im 44/152  soft-tissue]
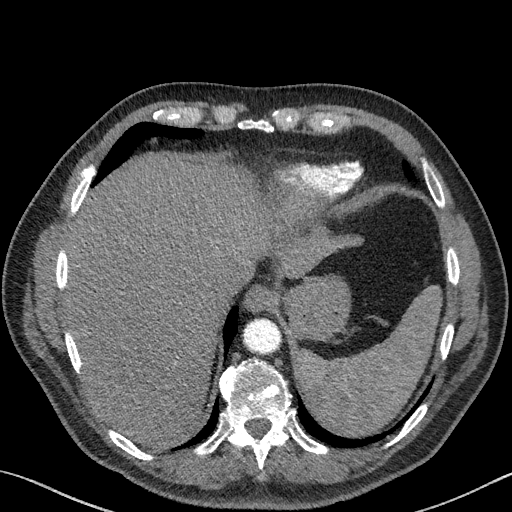
[im 54/152  lung]
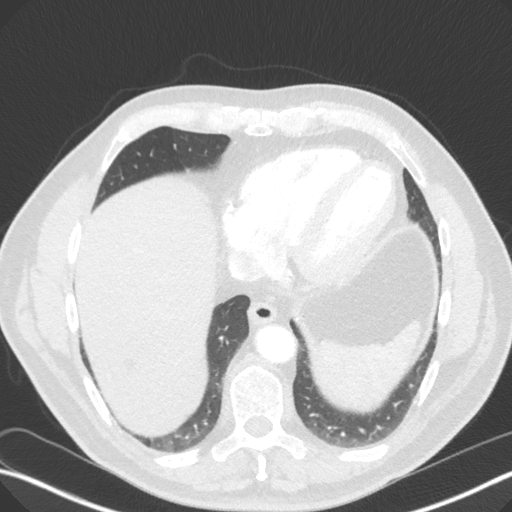
[im 59/152  soft-tissue]
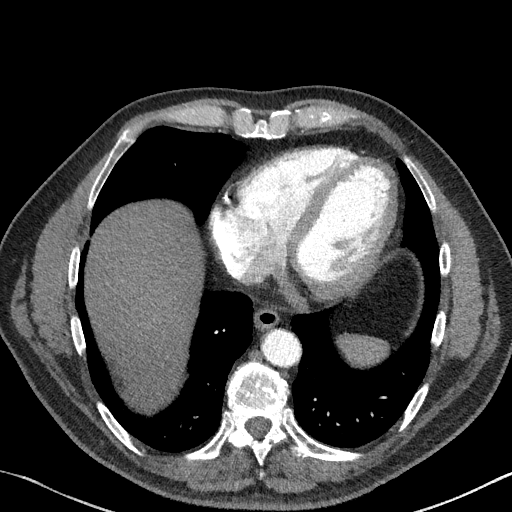
[im 69/152  lung]
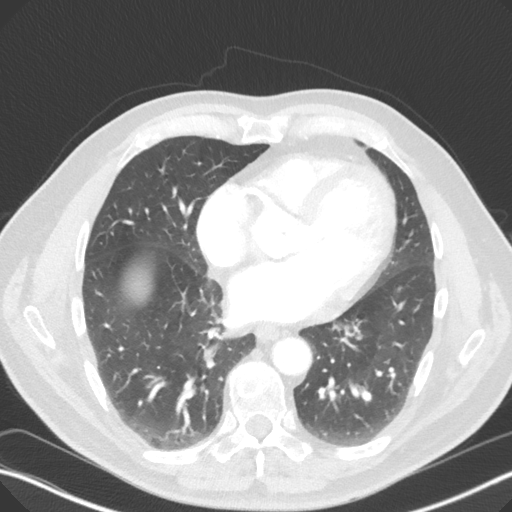
[im 78/152  soft-tissue]
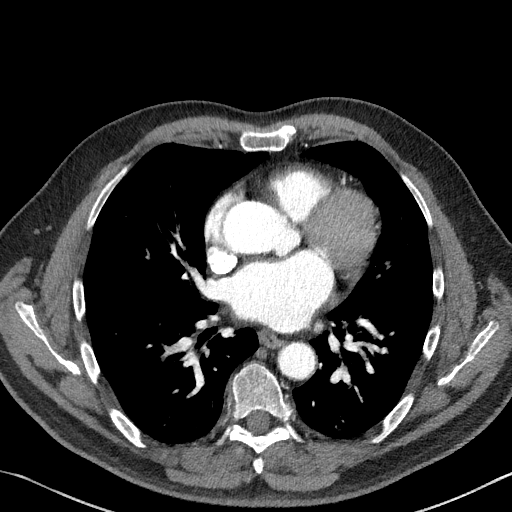
[im 83/152  lung]
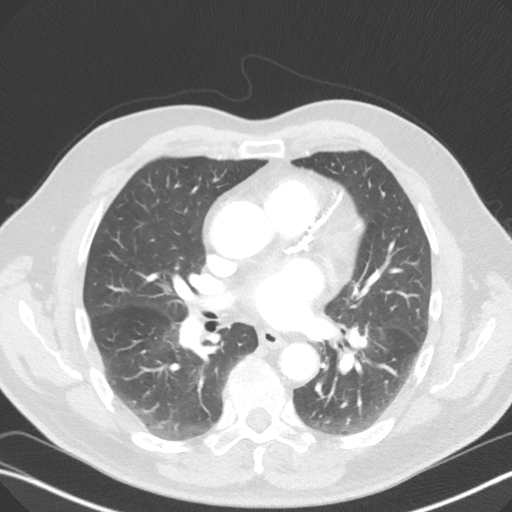
[im 93/152  soft-tissue]
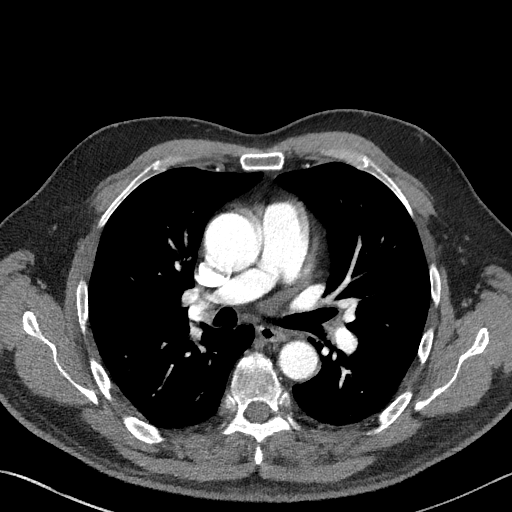
[im 98/152  lung]
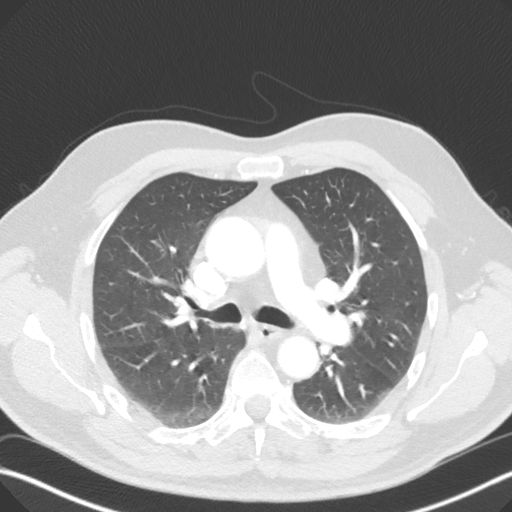
[im 108/152  soft-tissue]
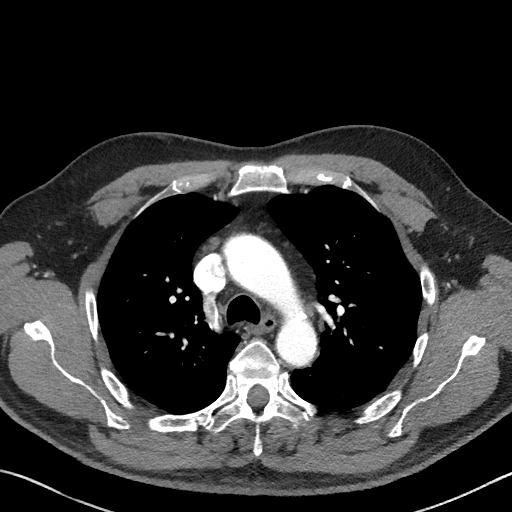
[im 113/152  lung]
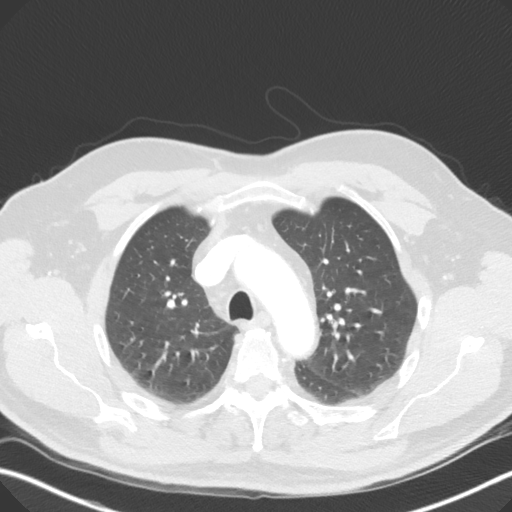
[im 122/152  soft-tissue]
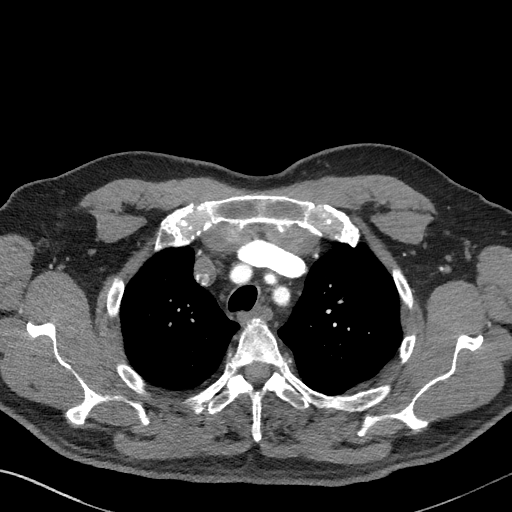
[im 132/152  lung]
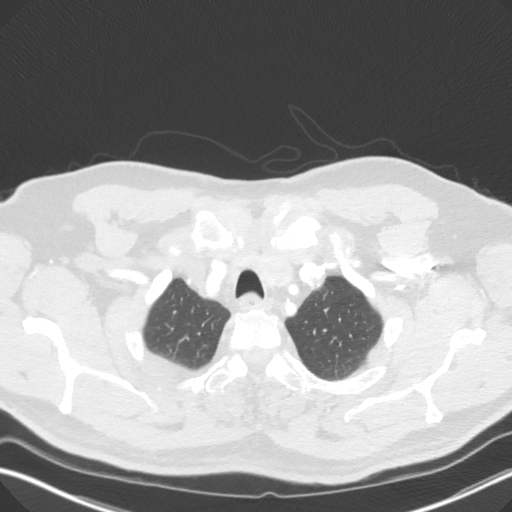
[im 137/152  soft-tissue]
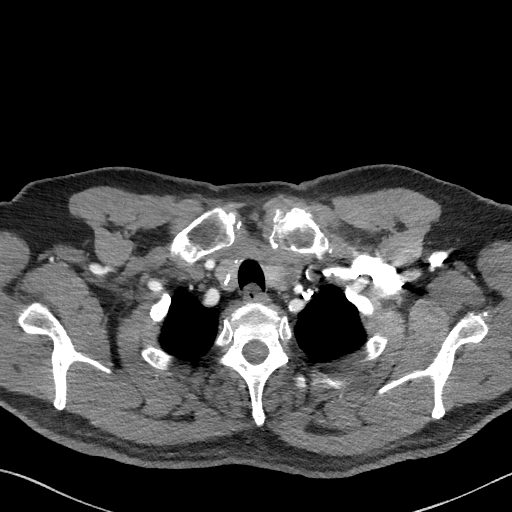
[im 147/152  lung]
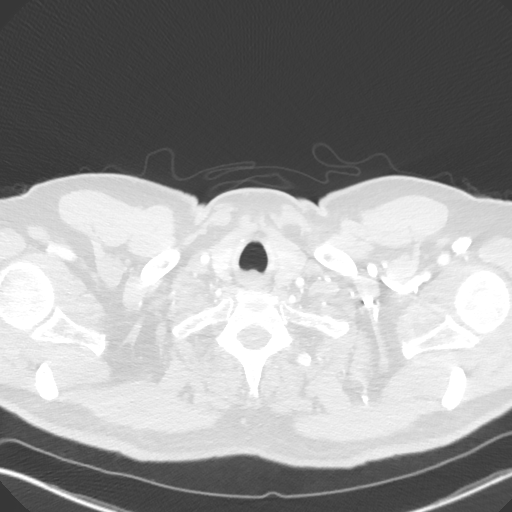

[19 of 32 positions shown; findings below may reference images not displayed]

RADIATION DOSE REDUCTION: This exam was performed according to the
departmental dose-optimization program which includes automated
exposure control, adjustment of the mA and/or kV according to
patient size and/or use of iterative reconstruction technique.

CONTRAST:  75mL [id number] IOPAMIDOL ([id number]-[id number]) INJECTION 76%
FINDINGS: Cardiovascular: Ascending aortic aneurysm measuring 4.2 cm diameter.
No change since prior study. No dissection. Great vessel origins are
patent. Noncalcific atherosclerotic plaque formation is present in
the descending aorta. Central pulmonary arteries are patent without
evidence of significant pulmonary embolus. Normal heart size. No
pericardial effusion.

Mediastinum/Nodes: Esophagus is decompressed. No significant
lymphadenopathy. Heterogeneous appearance of the thyroid gland with
1.6 cm nodule in the left thyroid gland, unchanged. This has been
previously evaluated by ultrasound on 05/10/2021. See previous
ultrasound for follow-up recommendations.

Lungs/Pleura: Motion artifact limits examination. No airspace
disease or consolidation. 7 mm nodule in the superior segment of the
right lower lung, unchanged.

Upper Abdomen: Subcentimeter hepatic cysts. Surgical absence of the
gallbladder. No acute abnormalities.

Musculoskeletal: Degenerative changes in the spine. No destructive
bone lesions. Compression of a midthoracic vertebra without change.

Review of the MIP images confirms the above findings.
IMPRESSION: 1. 4.2 cm aneurysm of the ascending aorta without change. No
dissection or acute changes identified.
2. Stable 7 mm nodule in the right lung. Stability for greater than
3 years suggest benign etiology and no additional follow-up is
indicated based on radiographic findings. No evidence of active
pulmonary disease.

## 2023-03-09 ENCOUNTER — Other Ambulatory Visit: Payer: Self-pay | Admitting: Psychiatry

## 2023-03-09 DIAGNOSIS — F5105 Insomnia due to other mental disorder: Secondary | ICD-10-CM

## 2023-03-09 DIAGNOSIS — F4310 Post-traumatic stress disorder, unspecified: Secondary | ICD-10-CM

## 2023-03-17 ENCOUNTER — Encounter: Payer: Self-pay | Admitting: Cardiology

## 2023-03-17 ENCOUNTER — Ambulatory Visit: Payer: No Typology Code available for payment source | Attending: Cardiology | Admitting: Cardiology

## 2023-03-17 VITALS — BP 124/58 | HR 61 | Ht 74.0 in | Wt 246.6 lb

## 2023-03-17 DIAGNOSIS — I77810 Thoracic aortic ectasia: Secondary | ICD-10-CM | POA: Diagnosis not present

## 2023-03-17 DIAGNOSIS — G4733 Obstructive sleep apnea (adult) (pediatric): Secondary | ICD-10-CM

## 2023-03-17 DIAGNOSIS — G720 Drug-induced myopathy: Secondary | ICD-10-CM

## 2023-03-17 DIAGNOSIS — I10 Essential (primary) hypertension: Secondary | ICD-10-CM

## 2023-03-17 DIAGNOSIS — I25119 Atherosclerotic heart disease of native coronary artery with unspecified angina pectoris: Secondary | ICD-10-CM | POA: Diagnosis not present

## 2023-03-17 DIAGNOSIS — E785 Hyperlipidemia, unspecified: Secondary | ICD-10-CM

## 2023-03-17 DIAGNOSIS — R002 Palpitations: Secondary | ICD-10-CM

## 2023-03-17 DIAGNOSIS — R600 Localized edema: Secondary | ICD-10-CM

## 2023-03-17 NOTE — Patient Instructions (Signed)
 Medication Instructions:  NO changes  *If you need a refill on your cardiac medications before your next appointment, please call your pharmacy*   Lab Work: FASTING Lipid and CMP now FASTING Lipid and CMP in June 2025  If you have labs (blood work) drawn today and your tests are completely normal, you will receive your results only by: MyChart Message (if you have MyChart) OR A paper copy in the mail If you have any lab test that is abnormal or we need to change your treatment, we will call you to review the results.    Follow-Up: At [facility], you and your health needs are our priority.  As part of our continuing mission to provide you with exceptional heart care, we have created designated Provider Care Teams.  These Care Teams include your primary Cardiologist (physician) and Advanced Practice Providers (APPs -  Physician Assistants and Nurse Practitioners) who all work together to provide you with the care you need, when you need it.     Your next appointment:   6 month(s)  Provider:   Alm Clay, MD

## 2023-03-17 NOTE — Progress Notes (Signed)
 Cardiology Office Note:  .   Date:  03/18/2023  ID:  Aaron Stout, DOB 1955-08-14, MRN [id number] PCP: Loreli Elsie JONETTA Mickey., MD  [facility] Providers Cardiologist:  Alm Clay, MD     Chief Complaint  Patient presents with   Follow-up    9 months --   Coronary Artery Disease     wants to discuss Myoview  from [facility] (Personally reviewed images from CD along with report --> NO Ischemia)     Patient Profile: .     Aaron Stout is a very anxious obese 68 y.o. male with a PMH notable for moderate two-vessel CAD, HLD (statin myalgias), and HTN who presents here for 19-month follow-up at the request of Loreli Elsie JONETTA Mickey., MD.    Aaron Stout was last seen in April 2020 for post cath to discuss results.  I did discuss referring him to Dr. Maryjane from [facility] to discuss possibility of CABG for the LAD lesion since it would be quite error significantly the long amount of stent.  We also discussed referral to [facility] to discuss inclisiran PCSK9 inhibitor.  My recommendation would have been for PCSK9 inhibitor.  BP was well-controlled on amlodipine  and bisoprolol .-> He was noting palpitations as well as chest pain both at rest and with even any exertion.  Palpitations did improve with bisoprolol  and he was unable to tolerate higher doses-noting fatigue and exercise intolerance.  Aaron Stout is seen in the interim by cardiologist in [place] [facility] for second opinion.  He had a Myoview  ordered which showed no evidence of ischemia (which is not necessarily unexpected based on Findings)  Subjective  Discussed the use of AI scribe software for clinical note transcription with the patient, who gave verbal consent to proceed.  History of Present Illness   The patient, with a history of heart disease (~borderline obstructive CAD with ongoing Sx of exertional angina), presented for a follow-up consultation after undergoing a nuclear stress test to assess  for ischemia based upon questionable results on Coronary Angiography. The patient expressed concern about the results of these tests, particularly the nuclear stress test, which showed changes in the patient's heart condition - No ischemia. The patient reported experiencing symptoms of palpitations, which were described as intermittent and not severe enough to cause significant distress or interfere with daily activities. The patient also reported shortness of breath and a feeling of tightness in the chest, particularly when climbing stairs or walking long distances.    The patient has been managing these symptoms with bisoprolol , a beta-blocker, and amlodipine , a vasodilator. The patient reported feeling more tired than usual, which was initially attributed to the bisoprolol , but the patient's size and duration of treatment suggest that this fatigue may be due to other factors.  In addition to heart disease, the patient has been diagnosed with stenosis in the lower back and hip, which has been causing significant pain and limiting the patient's ability to exercise. The patient has received two rounds of shots for this condition, which have provided some relief but have not completely alleviated the pain.  As a result of this pain, his exercise is very limited and much of his DOE is related to deconditioning.  The patient has been receiving injections of inclisiran (Leqvio ) for cholesterol management. The patient has completed two rounds of this treatment and is due for a follow-up cholesterol level check to assess the effectiveness of the medication.  The patient's symptoms and  test results suggest the possibility of small artery disease, but this has not been definitively confirmed.      Cardiovascular ROS: positive for - chest pain, dyspnea on exertion, edema, irregular heartbeat, loss of consciousness, palpitations, and shortness of breath negative for - orthopnea, paroxysmal nocturnal dyspnea, rapid  heart rate, or syncope or near syncope of a TIA/CVA or amaurosis fugax, claudication  ROS:  Review of Systems - Negative except symptoms noted in HPI most notably issues with his spinal stenosis and chronic pain.    Objective   Current Meds  Medication Sig   albuterol  (PROVENTIL  HFA;VENTOLIN  HFA) 108 (90 Base) MCG/ACT inhaler Inhale 2 puffs into the lungs 2 (two) times daily.   amLODipine  (NORVASC ) 10 MG tablet TAKE 1 TABLET BY MOUTH EVERY DAY   Amphetamine  ER (ADZENYS  XR-ODT) 12.5 MG TBED Take 12.5 mg by mouth 2 (two) times daily.   amphetamine -dextroamphetamine (ADDERALL XR) 20 MG 24 hr capsule Take 1 capsule (20 mg total) by mouth 2 (two) times daily.   Ascorbic Acid (VITAMIN C WITH ROSE HIPS) 1000 MG tablet Take 1,000 mg by mouth in the morning and at bedtime.   aspirin  EC 81 MG tablet Take 1 tablet (81 mg total) by mouth daily. Swallow whole.   azelastine (ASTELIN) 0.1 % nasal spray Place 1 spray into both nostrils as needed for rhinitis. Use in each nostril as directed   B-12, Methylcobalamin, 1000 MCG SUBL Place 1,000 mcg under the tongue every evening.   bisoprolol  (ZEBETA ) 5 MG tablet Take 0.5 tablets (2.5 mg total) by mouth daily.   Cholecalciferol (VITAMIN D) 2000 UNITS tablet Take 3,000 Units by mouth daily.    Clobetasol Propionate 0.05 % shampoo Apply 0.05 % topically as needed.   clotrimazole-betamethasone (LOTRISONE) cream Apply 1 application topically 2 (two) times daily as needed (for psoriasis).   Coenzyme Q10 (CO Q-10) 100 MG CAPS Take 100 mg by mouth daily.   colestipol  (COLESTID ) 5 g packet Take 10 g by mouth daily. (Patient taking differently: Take 7.5 g by mouth every evening.)   colestipol  (COLESTID ) 5 g packet Take 7.5 g by mouth daily.   DHEA 25 MG CAPS Take 25 mg by mouth daily.    escitalopram  (LEXAPRO ) 10 MG tablet Take 1 tablet (10 mg total) by mouth daily.   fexofenadine (ALLEGRA) 180 MG tablet Take 90 mg by mouth at bedtime.   fluticasone (FLONASE) 50  MCG/ACT nasal spray Place 1 spray into both nostrils as needed for allergies or rhinitis.   montelukast (SINGULAIR) 10 MG tablet Take 10 mg by mouth daily.   Multiple Vitamin (MULTIVITAMIN) tablet Take 1 tablet by mouth daily.   nystatin cream (MYCOSTATIN) Apply 1 Application topically as needed for dry skin.   Omega-3 Fatty Acids (FISH OIL) 1000 MG CAPS Take 2,000 mg by mouth 2 (two) times daily.    Omeprazole-Sodium Bicarbonate (ZEGERID) 20-1100 MG CAPS capsule Take 1 capsule by mouth 2 (two) times daily.    polycarbophil (FIBERCON) 625 MG tablet Take 5,000 mg by mouth daily.   Pregnenolone 50 MG TABS Take 100 mg by mouth daily.   QUERCETIN PO Take 1,000 mg by mouth in the morning and at bedtime.   QUEtiapine  (SEROQUEL ) 25 MG tablet TAKE 3 TABLETS BY MOUTH AT BEDTIME   Turmeric 500 MG CAPS Take 500 mg by mouth 2 (two) times daily. With Black Pepper   valACYclovir (VALTREX) 500 MG tablet Take 500 mg by mouth 2 (two) times daily.    WIXELA  INHUB 100-50 MCG/ACT AEPB Inhale 1 puff into the lungs 2 (two) times daily.     Studies Reviewed: SABRA        CPX May 2023: Normal exercise/functional capacity.  No indication of cardiopulmonary limitation.  Mild Kardie Tobb incompetence.  Body habitus was the primary contributor to her dyspnea. ECHO 07/28/2021: EF 60 to 65%.  Normal LV function.  No RWMA.  Grade 1 DD.  Mild MR.  Normal aortic valve.  Ascending aorta measured at 40 mm. CATH 06/24/2022: Ost-mid LAD 40% (long stenosis with RFR dropping from 0.93-.083 after SP~2; Ost-Prox 30%, Prox-LCx ~70% & mid LCx 65% (RFR 0.90, FFR 0.81 - borderline), EF 55-65%   Myoview  ([facility] 10/17/2022 EKG negative for ischemia.  EF estimated at 45%.  Normal perfusion with no evidence of ischemia or infarction.  No chest pain during exam.  LOW RISK  Risk Assessment/Calculations:              Physical Exam:   VS:  BP (!) 124/58 (BP Location: Left Arm, Patient Position: Sitting, Cuff  Size: Large)   Pulse 61   Ht 6' 2 (1.88 m)   Wt 246 lb 9.6 oz (111.9 kg)   SpO2 97%   BMI 31.66 kg/m    Wt Readings from Last 3 Encounters:  03/17/23 246 lb 9.6 oz (111.9 kg)  01/10/23 249 lb (112.9 kg)  10/10/22 232 lb (105.2 kg)    GEN: Well nourished, well developed in no acute distress; obese; anxious NECK: No JVD; No carotid bruits CARDIAC: RRR, with occasional ectopy; normal S1, split S2; no murmurs, rubs, gallops RESPIRATORY:  Clear to auscultation without rales, wheezing or rhonchi ; nonlabored, good air movement. ABDOMEN: Soft, non-tender, non-distended EXTREMITIES:  No edema; No deformity      ASSESSMENT AND PLAN: .    Problem List Items Addressed This Visit       Cardiology Problems   Coronary artery disease involving native coronary artery with angina pectoris (HCC) - Primary (Chronic)   Stable symptoms with Myoview  suggesting that the LAD & LCx lesions are indeed not Flow-Limiting.  Simply put, the study clarifies the potential significance of these lesions and would suggest that revascularization (either PCI or CABG) would likely NOT help his symptoms. We reviewed the possibility of microvascular disease (arterioles & capillaries) and the difficulty in testing for this (a Stress Pet would have been more appropriate to assess for Macro vs. Microvascular disease -Continue current medical management including cholesterol and blood pressure control. -Encourage regular exercise to promote collateral blood flow development. -> discussed exercise options that would be feasible with his chronic LBP / spinal stenosis.       Relevant Medications   colestipol  (COLESTID ) 5 g packet   Other Relevant Orders   Lipid panel   Comprehensive metabolic panel   Lipid panel   Comprehensive metabolic panel   Essential hypertension (Chronic)   Blood pressure well-controlled on current meds-amlodipine  and metoprolol . -Continue current therapy      Relevant Medications    colestipol  (COLESTID ) 5 g packet   Hyperlipidemia with target low density lipoprotein (LDL) cholesterol less than 55 mg/dL (Chronic)   Hyperlipidemia with statin myopathy On Inclisiran (Leqvio ) injections for cholesterol control. -Check lipid panel now and repeat in June to assess response to Inclisiran.      Relevant Medications   colestipol  (COLESTID ) 5 g packet   Thoracic aortic ectasia (HCC) (Chronic)   Relevant Medications   colestipol  (COLESTID ) 5 g packet  Other   Lower extremity edema (Chronic)   Mild lower extremity edema, possibly related to amlodipine  use. -Advise to elevate legs at end of the day and consider compression socks if swelling persists or during prolonged sitting/travel.      Palpitation (Chronic)   Occasional palpitations, not worsened with exertion. -Continue bisoprolol  2.5mg  at night. -If palpitations are bothersome during the day, patient may take an additional half tablet.      Sleep apnea (Chronic)   Statin myopathy (Chronic)   Has tried multiple different statins as noted before (simvastatin, lovastatin, atorvastatin  and rosuvastatin) all of which have caused significant myalgias and fatigue as well as memory issues.  Was started on Leqvio  injections and is now had at least 2 since initiating therapy.  He is due for his next injection on May 19, we will check his lipids now and then again in 6 months to ensure that is working.    He seems to tolerating the injections well..      Relevant Orders   Lipid panel   Comprehensive metabolic panel   Lipid panel   Comprehensive metabolic panel   Follow-Up: Return in about 6 months (around 09/14/2023).  I spent 62 minutes in the care of Aaron Stout today including reviewing labs (2 min), reviewing studies (5 min), reviewing outside studies (15 min - personally viewed outside imaging), face to face time discussing treatment options (28 min), reviewing records from prior visits as well as 2nd opinion  consult (6 min), 6 min dictating, and documenting in the encounter.     Signed, Alm MICAEL Clay, MD, MS Alm Clay, M.D., M.S. Interventional Cardiologist  [facility]  Pager # [phone] Phone # [phone] [address] [place], [place] [address]

## 2023-03-18 ENCOUNTER — Encounter: Payer: Self-pay | Admitting: Cardiology

## 2023-03-18 DIAGNOSIS — R600 Localized edema: Secondary | ICD-10-CM | POA: Insufficient documentation

## 2023-03-18 NOTE — Assessment & Plan Note (Addendum)
 Has tried multiple different statins as noted before (simvastatin, lovastatin, atorvastatin  and rosuvastatin) all of which have caused significant myalgias and fatigue as well as memory issues.  Was started on Leqvio  injections and is now had at least 2 since initiating therapy.  He is due for his next injection on May 19, we will check his lipids now and then again in 6 months to ensure that is working.    He seems to tolerating the injections well.SABRA

## 2023-03-18 NOTE — Assessment & Plan Note (Signed)
 Mild lower extremity edema, possibly related to amlodipine use. -Advise to elevate legs at end of the day and consider compression socks if swelling persists or during prolonged sitting/travel.

## 2023-03-18 NOTE — Assessment & Plan Note (Signed)
 Hyperlipidemia with statin myopathy On Inclisiran (Leqvio) injections for cholesterol control. -Check lipid panel now and repeat in June to assess response to Inclisiran.

## 2023-03-18 NOTE — Assessment & Plan Note (Signed)
 Blood pressure well-controlled on current meds-amlodipine and metoprolol. -Continue current therapy

## 2023-03-18 NOTE — Assessment & Plan Note (Signed)
 Occasional palpitations, not worsened with exertion. -Continue bisoprolol 2.5mg  at night. -If palpitations are bothersome during the day, patient may take an additional half tablet.

## 2023-03-18 NOTE — Assessment & Plan Note (Signed)
 Stable symptoms with Myoview  suggesting that the LAD & LCx lesions are indeed not Flow-Limiting.  Simply put, the study clarifies the potential significance of these lesions and would suggest that revascularization (either PCI or CABG) would likely NOT help his symptoms. We reviewed the possibility of microvascular disease (arterioles & capillaries) and the difficulty in testing for this (a Stress Pet would have been more appropriate to assess for Macro vs. Microvascular disease -Continue current medical management including cholesterol and blood pressure control. -Encourage regular exercise to promote collateral blood flow development. -> discussed exercise options that would be feasible with his chronic LBP / spinal stenosis.

## 2023-04-01 LAB — LIPID PANEL
Chol/HDL Ratio: 2.4 {ratio} (ref 0.0–5.0)
Cholesterol, Total: 105 mg/dL (ref 100–199)
HDL: 44 mg/dL (ref >39)
LDL Chol Calc (NIH): 36 mg/dL (ref 0–99)
Triglycerides: 143 mg/dL (ref 0–149)
VLDL Cholesterol Cal: 25 mg/dL (ref 5–40)

## 2023-04-01 LAB — COMPREHENSIVE METABOLIC PANEL
ALT: 34 [IU]/L (ref 0–44)
AST: 28 [IU]/L (ref 0–40)
Albumin: 4.3 g/dL (ref 3.9–4.9)
Alkaline Phosphatase: 55 [IU]/L (ref 44–121)
BUN/Creatinine Ratio: 20 (ref 10–24)
BUN: 23 mg/dL (ref 8–27)
Bilirubin Total: 0.3 mg/dL (ref 0.0–1.2)
CO2: 24 mmol/L (ref 20–29)
Calcium: 9.7 mg/dL (ref 8.6–10.2)
Chloride: 103 mmol/L (ref 96–106)
Creatinine, Ser: 1.16 mg/dL (ref 0.76–1.27)
Globulin, Total: 2 g/dL (ref 1.5–4.5)
Glucose: 89 mg/dL (ref 70–99)
Potassium: 4.8 mmol/L (ref 3.5–5.2)
Sodium: 142 mmol/L (ref 134–144)
Total Protein: 6.3 g/dL (ref 6.0–8.5)
eGFR: 69 mL/min/{1.73_m2} (ref >59)

## 2023-04-02 ENCOUNTER — Encounter: Payer: Self-pay | Admitting: Cardiology

## 2023-05-27 ENCOUNTER — Other Ambulatory Visit: Payer: Self-pay | Admitting: Psychiatry

## 2023-05-27 DIAGNOSIS — F411 Generalized anxiety disorder: Secondary | ICD-10-CM

## 2023-05-27 DIAGNOSIS — F4310 Post-traumatic stress disorder, unspecified: Secondary | ICD-10-CM

## 2023-05-30 ENCOUNTER — Encounter: Payer: Self-pay | Admitting: Psychiatry

## 2023-05-30 ENCOUNTER — Ambulatory Visit (INDEPENDENT_AMBULATORY_CARE_PROVIDER_SITE_OTHER): Payer: No Typology Code available for payment source | Admitting: Psychiatry

## 2023-05-30 DIAGNOSIS — F5105 Insomnia due to other mental disorder: Secondary | ICD-10-CM | POA: Diagnosis not present

## 2023-05-30 DIAGNOSIS — F900 Attention-deficit hyperactivity disorder, predominantly inattentive type: Secondary | ICD-10-CM

## 2023-05-30 DIAGNOSIS — F4310 Post-traumatic stress disorder, unspecified: Secondary | ICD-10-CM

## 2023-05-30 DIAGNOSIS — F411 Generalized anxiety disorder: Secondary | ICD-10-CM | POA: Diagnosis not present

## 2023-05-30 MED ORDER — AMPHETAMINE-DEXTROAMPHET ER 20 MG PO CP24: 40.0000 mg | ORAL_CAPSULE | Freq: Every day | ORAL | 0 refills | Status: AC

## 2023-05-30 MED ORDER — ESCITALOPRAM OXALATE 5 MG PO TABS: 15.0000 mg | ORAL_TABLET | Freq: Every day | ORAL | 1 refills | Status: DC

## 2023-05-30 NOTE — Progress Notes (Signed)
 Aaron Stout [id number] Aug 04, 1955 68 y.o.  Subjective:   Patient ID:  Aaron Stout is a 68 y.o. (DOB 1955/07/07) male.  Chief Complaint:  Chief Complaint  Patient presents with   Follow-up   Depression   Anxiety   ADD   Sleeping Problem    Aaron Stout presents to the office today for follow-up of mood and anxiety and sleep.  At visit in 2020had residual anxiety and it was suggested he disc with cardiologist the use of a beta blocker metoprolol and it helped BP and  anxiety DT multiple failed anxiety meds.  Has been generally good.    At  visit 10/27/19  Lexapro was increased from 5 to 7.5mg  dailty for anxiety which is chronic. Thinks the anxiety is a helpful.  I  Chronic anxiety and stress at work.  Evaluations pending at work.  Less obsessing about that.  Has tried 10mg  Lexapro and he feels it was too much.  visit October 2020 without med changes.    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   Still CO sweating easily. Pretty good overall.  No major changes since here.  Same old chronic tiredness and easily OOB.  Still a little anxiety hangs on.  Not horrible and able to function and keep on going.    12/19/2019 appointment with the following noted: Fine overall.  Generally steady with mood and other factors. Chronic tiredness without change.  Trying to manage stress as much as possible. Stress Aaron Stout back in the house and some lability continues with anger outbursts.  He always finds someone at work he doesn't like and starts ruminating on it. SE mild hangover. Mild hangover with Seroquel which helps the sleep. Makes him sleepy in 60-90 mins. Chronic sweating and SOB. Plan: No med changes  06/18/2020 appointment with following noted: No Covid.   Knee surgery 12/21. Otherwise health is good.    02/25/21 appt noted: Taking lexapro 7.5 mg , Adderall and quetiapine 75 mg HS. No SE.  Sleep pretty  good. Tiredness worse in the day.  08/26/21  appt noted:  HT at [facility] ph has the Adderall. Benfit Adderall No SE  Overll doing ok with mood and anxity. Pt reports that mood is occ irritable and Anxious and describes anxiety as mild-moderate like if too many things coming at him at once.   Anxiety symptoms include: Excessive Worry,. Pt reports no sleep issues. Pt reports that appetite is good. Pt reports that energy is good and good. Concentration is good. Suicidal thoughts:  denied by patient Plan: no med changes  03/03/22 appt noted: Steady overall.   Still some hangover effects from Seroquel.   The HT version of Adderall is less effective than the [facility] version but supply problem. Recent dx angina with microvascular Dz.  On a couple of meds for that.  Cardiology aware he's taking Adderall XR 20 BID. Saw cardiology last week.  Just started Imdur and so far less chest pain.   No SE otherwise and satisfied with meds. Plan: No med changes. 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  08/18/2022 appointment noted: Meds as above Some SOB and CP with 2nd opinion as to cause.  No clear answer yet.   Asked question as to how to deal with differences between first and second opinion.   Is in  some turmoil bc of the uncertainty as to what to do.  Some anxiety over this health concern.   No indication for med changes. Plan: rec increase lexapro to 10 mg for ongoing anxiety.  He agrees.  11/29/22 appt noted: Hasn't been able to get Adderall XR 20 mg BID in last couple of weeks. Meds: Lexapro 10 mg daily, quetiapine 25 mg 3 tabs at night., out of Adderall  Was getting Adderall XR from Dr. Clelia Stout bc easier to get from him than this office.   Still feels a little tired without it.  Focus has suffered some as well.   Wiped out by the day more.  Harder to focus when gets home.   Was satisfied with Adderall  XR 20 BID  No problem with increasing Lexapro 10 mg daily and thinks it was helpful.  Had neg stress test but he questions the results.  Has a question about it.  Doesn't trust the results.  BC he still has the CP and SOB sx.  SX probably a little more pronounced even off the Adderall. Has sciatica but didn't tolerate prednisone trial bc felt wiped out by it and it caused more chest pain and SOB when working.    05/30/23 appt noted: Med: Adderall for focus XR 20 mg 2 AM. Lexapro 10 mg daily, quetiapine 25 mg 3 tabs at night., Still working.  Pretty good overall without much change.  Same old tiredness and tendency to sleepiness. Satisfied with meds.  Benefit sleep with quetiapine. Anxiety better in general than in the past.  No longer chest pain driving to work.  Need to be more patient with work and wife. 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  He's aware and tries to catch it.    Past Psychiatric Medication Trials: Lexapro 10 SE, Zoloft,  Emsam,  Welllbutrin Adderall XR20 BID,  Ritalin , Concerta, Vyvanse, seroquel 75 for sleep, perphenazine, Depakote,  Vraylar 1.5, lithium 600, Trileptal, Buspar dizzy, lamotrigine,  Belsomra,   Review of Systems:  Review of Systems  Constitutional:  Positive for fatigue.       Long history of sweating  Respiratory:  Positive for shortness of breath.   Cardiovascular:  Positive for chest pain. Negative for palpitations.  Musculoskeletal:  Positive for joint swelling.  Neurological:  Negative for dizziness and tremors.  Psychiatric/Behavioral:  Positive for decreased concentration. Negative for agitation, behavioral problems, confusion, dysphoric mood, hallucinations, self-injury, sleep disturbance and suicidal ideas. The patient is nervous/anxious. The patient is not hyperactive.     Medications: I have reviewed the patient's current medications.  Current Outpatient  Medications  Medication Sig Dispense Refill   albuterol (PROVENTIL HFA;VENTOLIN HFA) 108 (90 Base) MCG/ACT inhaler Inhale 2 puffs into the lungs 2 (two) times daily. 1 Inhaler 3   amLODipine (NORVASC) 10 MG tablet TAKE 1 TABLET BY MOUTH EVERY DAY 90 tablet 7   amphetamine-dextroamphetamine (ADDERALL XR) 20 MG 24 hr capsule Take 1 capsule (20 mg total) by mouth 2 (two) times daily. 60 capsule 0   amphetamine-dextroamphetamine (ADDERALL XR) 20 MG 24 hr capsule Take 2 capsules (40 mg total) by mouth daily. 60 capsule 0   amphetamine-dextroamphetamine (ADDERALL XR) 20 MG 24 hr capsule Take 2 capsules (40 mg total) by mouth daily. 60 capsule 0   Ascorbic Acid (VITAMIN C WITH ROSE HIPS) 1000 MG tablet Take 1,000 mg by mouth in the morning  and at bedtime.     aspirin EC 81 MG tablet Take 1 tablet (81 mg total) by mouth daily. Swallow whole. 32 tablet 0   azelastine (ASTELIN) 0.1 % nasal spray Place 1 spray into both nostrils as needed for rhinitis. Use in each nostril as directed     B-12, Methylcobalamin, 1000 MCG SUBL Place 1,000 mcg under the tongue every evening.     bisoprolol (ZEBETA) 5 MG tablet Take 0.5 tablets (2.5 mg total) by mouth daily. 45 tablet 3   Cholecalciferol (VITAMIN D) 2000 UNITS tablet Take 3,000 Units by mouth daily.      Clobetasol Propionate 0.05 % shampoo Apply 0.05 % topically as needed.     clotrimazole-betamethasone (LOTRISONE) cream Apply 1 application topically 2 (two) times daily as needed (for psoriasis).     Coenzyme Q10 (CO Q-10) 100 MG CAPS Take 100 mg by mouth daily.     colestipol (COLESTID) 5 g packet Take 10 g by mouth daily. (Patient taking differently: Take 7.5 g by mouth every evening.) 60 packet 5   colestipol (COLESTID) 5 g packet Take 7.5 g by mouth daily.     DHEA 25 MG CAPS Take 25 mg by mouth daily.      fexofenadine (ALLEGRA) 180 MG tablet Take 90 mg by mouth at bedtime.     fluticasone (FLONASE) 50 MCG/ACT nasal spray Place 1 spray into both nostrils  as needed for allergies or rhinitis.     montelukast (SINGULAIR) 10 MG tablet Take 10 mg by mouth daily.     Multiple Vitamin (MULTIVITAMIN) tablet Take 1 tablet by mouth daily.     nystatin cream (MYCOSTATIN) Apply 1 Application topically as needed for dry skin.     Omega-3 Fatty Acids (FISH OIL) 1000 MG CAPS Take 2,000 mg by mouth 2 (two) times daily.      Omeprazole-Sodium Bicarbonate (ZEGERID) 20-1100 MG CAPS capsule Take 1 capsule by mouth 2 (two) times daily.      polycarbophil (FIBERCON) 625 MG tablet Take 5,000 mg by mouth daily.     Pregnenolone 50 MG TABS Take 100 mg by mouth daily.     QUERCETIN PO Take 1,000 mg by mouth in the morning and at bedtime.     QUEtiapine (SEROQUEL) 25 MG tablet TAKE 3 TABLETS BY MOUTH AT BEDTIME 270 tablet 0   Turmeric 500 MG CAPS Take 500 mg by mouth 2 (two) times daily. With Black Pepper     valACYclovir (VALTREX) 500 MG tablet Take 500 mg by mouth 2 (two) times daily.      WIXELA INHUB 100-50 MCG/ACT AEPB Inhale 1 puff into the lungs 2 (two) times daily.     escitalopram (LEXAPRO) 5 MG tablet Take 3 tablets (15 mg total) by mouth daily. 270 tablet 1   No current facility-administered medications for this visit.    Medication Side Effects: None ? sweating  Allergies:  Allergies  Allergen Reactions   Duloxetine Hcl Other (See Comments)    Drowsiness  *Cymbalta   Ibuprofen Hives   Voltaren [Diclofenac] Shortness Of Breath    Also had palpitations   Penicillins Other (See Comments)    Unknown Has patient had a PCN reaction causing immediate rash, facial/tongue/throat swelling, SOB or lightheadedness with hypotension: Unknown Has patient had a PCN reaction causing severe rash involving mucus membranes or skin necrosis: Unknown Has patient had a PCN reaction that required hospitalization: Unknown Has patient had a PCN reaction occurring within the last 10 years: No  If all of the above answers are "NO", then may proceed with Cephalosporin  use.  Childhood response    Dilaudid [Hydromorphone Hcl] Nausea And Vomiting   Simvastatin Other (See Comments)    Cramps    Past Medical History:  Diagnosis Date   Adenomatous colon polyp 2001   ALLERGIC RHINITIS 05/18/2007   Qualifier: Diagnosis of  By: Yetta Barre RN, CGRN, Sheri     Anxiety    Arthritis 2013   arthritis-hands; Per PCPs notes-inflammatory arthritis   ASTHMA 05/18/2007   Qualifier: Diagnosis of  By: Yetta Barre RN, CGRN, Sheri     Bipolar 1 disorder (HCC)    With depression   Cancer (HCC)    hx skin cancer   Chest tightness    Chronic fatigue syndrome    Chronic headaches    d/t old neck injury   Complication of anesthesia    x1 episode "panic attack" awakening-none in recent years   Coronary artery disease involving native coronary artery of native heart with other form of angina pectoris (HCC) 06/2022   Cardiac catheter revealed ostial mid LAD 40% stenosis followed by a long segment after D1/SP1 all the way to SP2 - RFR 0.83 (physiologically significant ); tandem Prox & Mid Cx ~70% ~borderline RFR/GFR (0.9 & 0.8 -> reasonable to revascularize given symptoms)   Diverticulosis    Ectatic thoracic aorta (HCC) 07/2018   Follow-up CTA chest 07/27/2021: Ascending aortic dilation 4.2 cm.  No change.   GASTROESOPHAGEAL REFLUX DISEASE 05/18/2007   Qualifier: Diagnosis of  By: Yetta Barre RN, CGRN, Sheri     HEADACHE, CHRONIC 05/18/2007   Qualifier: Diagnosis of  By: Yetta Barre RN, CGRN, Sheri     Heart murmur    benign    History of degenerative disc disease    HOH (hard of hearing)    slight-bilateral   HSV-2 (herpes simplex virus 2) infection    Per PCP-genital   Hyperlipidemia 05/18/2007   Qualifier: Diagnosis of  By: Yetta Barre RN, CGRN, Sheri     Hyperlipidemia due to dietary fat intake    Hypertension    IBS (irritable bowel syndrome)    Incidental lung nodule, > 3mm and < 8mm 2019   Stable is a 2022   Internal hemorrhoids with other complication 09/11/2013   Irritable bowel  syndrome 05/18/2007   Qualifier: Diagnosis of  By: Yetta Barre RN, CGRN, Sheri     Lyme disease    OSA (obstructive sleep apnea)    better s/p UPP-no problems now   Personal history of adenomatous colonic polyps 05/18/2007   2001, 2004, 2006, 2009 (last with one small adenoma) Max 4 polyps in past with largest 7 mm (both in 2004)  09/11/2013 7 small polyps adenomas  02/2017 5 adeoimas max 10 mm repeat colon 03/2020     PONV (postoperative nausea and vomiting)    Prostatitis    PROSTATITIS, CHRONIC 05/18/2007   Qualifier: Diagnosis of  By: Yetta Barre RN, CGRN, Sheri     Radiculopathy of cervical spine    Small intestinal bacterial overgrowth (SIBO) ? 08/07/2022   Syncope 05/2008   1 episode-evaluated by cardiologist-negative findings-?related to low B/p (hydration tends to help).   Testicular hypofunction    Vitamin D deficiency     Family History  Problem Relation Age of Onset   Breast cancer Mother 65   Heart failure Father 75   Bowel Disease Father 31       Bowel obstruction   CAD Brother 106  PCI   Heart attack Brother 77   CAD Brother 54       PCI   Hepatitis B Brother    Heart attack Maternal Grandfather    Congestive Heart Failure Maternal Grandfather    COPD Maternal Grandfather    Heart disease Other        grandfather/uncle   Alcoholism Other        greatgrandfather/uncle     Colon polyps Other        uncle   Bipolar disorder Son    Ehlers-Danlos syndrome Son    Migraines Son    Colon cancer Neg Hx    Esophageal cancer Neg Hx    Rectal cancer Neg Hx    Stomach cancer Neg Hx     Social History   Socioeconomic History   Marital status: Married    Spouse name: Not on file   Number of children: 2   Years of education: Not on file   Highest education level: Master's degree (e.g., MA, MS, MEng, MEd, MSW, MBA)  Occupational History   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  Tobacco Use   Smoking status: Former    Current  packs/day: 0.00    Average packs/day: 0.5 packs/day for 7.0 years (3.5 ttl pk-yrs)    Types: Cigarettes    Start date: 03/07/1977    Quit date: 07/06/1980    Years since quitting: 42.9   Smokeless tobacco: Never  Vaping Use   Vaping status: Never Used  Substance and Sexual Activity   Alcohol use: No   Drug use: No   Sexual activity: Yes    Partners: Female  Other Topics Concern   Not on file  Social History Narrative   Married father of 64 sons   56 year old son graduated Therapist, sports & Lives at home with parents (bipolar)   27 year old son went to [employer] then [employer] as of May 2021, living at home       He has a MA in Psychologist, educational - & teaches Art @ [employer]   Social Drivers of Corporate investment banker Strain: Not on file  Food Insecurity: Not on file  Transportation Needs: Not on file  Physical Activity: Not on file  Stress: Not on file  Social Connections: Unknown (07/20/2021)   [facility]   Social Network    Social Network: Not on file  Intimate Partner Violence: Unknown (06/11/2021)   Received from [facility]   HITS    Physically Hurt: Not on file    Insult or Talk Down To: Not on file    Threaten Physical Harm: Not on file    Scream or Curse: Not on file    Past Medical History, Surgical history, Social history, and Family history were reviewed and updated as appropriate.   Jake dx bipolar on lithium with tremor.  Answered questions about this.  Please see review of systems for further details on the patient's review from today.   Objective:   Physical Exam:  There were no vitals taken for this visit.  Physical Exam Constitutional:      General: He is not in acute distress.    Appearance: He is well-developed.  Musculoskeletal:        General: No deformity.  Neurological:     Mental Status: He is alert and oriented to person, place, and time.     Motor:  No tremor.     Coordination: Coordination  normal.     Gait: Gait normal.  Psychiatric:        Attention and Perception: He is inattentive. He does not perceive auditory hallucinations.        Mood and Affect: Mood is anxious. Mood is not depressed. Affect is not labile, blunt, angry, tearful or inappropriate.        Speech: Speech normal.        Behavior: Behavior normal.        Thought Content: Thought content normal. Thought content is not delusional. Thought content does not include homicidal or suicidal ideation. Thought content does not include suicidal plan.        Cognition and Memory: Cognition normal.        Judgment: Judgment normal.     Comments: Insight intact. No auditory or visual hallucinations.  Irritability partly controlled    Get's SOB walking from one building to the next.  Lab Review:     Component Value Date/Time   NA 142 03/31/2023 1244   K 4.8 03/31/2023 1244   CL 103 03/31/2023 1244   CO2 24 03/31/2023 1244   GLUCOSE 89 03/31/2023 1244   GLUCOSE 103 (H) 09/12/2017 0556   BUN 23 03/31/2023 1244   CREATININE 1.16 03/31/2023 1244   CALCIUM 9.7 03/31/2023 1244   PROT 6.3 03/31/2023 1244   ALBUMIN 4.3 03/31/2023 1244   AST 28 03/31/2023 1244   ALT 34 03/31/2023 1244   ALKPHOS 55 03/31/2023 1244   BILITOT 0.3 03/31/2023 1244   GFRNONAA >60 09/12/2017 0556   GFRAA >60 09/12/2017 0556       Component Value Date/Time   WBC 10.8 06/20/2022 0831   WBC 8.7 09/12/2017 0556   RBC 4.64 06/20/2022 0831   RBC 4.62 09/12/2017 0556   HGB 14.5 06/20/2022 0831   HCT 42.6 06/20/2022 0831   PLT 219 06/20/2022 0831   MCV 92 06/20/2022 0831   MCH 31.3 06/20/2022 0831   MCH 31.4 09/12/2017 0556   MCHC 34.0 06/20/2022 0831   MCHC 33.2 09/12/2017 0556   RDW 13.2 06/20/2022 0831   LYMPHSABS 2.1 09/26/2014 1543   MONOABS 0.6 09/26/2014 1543   EOSABS 0.5 09/26/2014 1543   BASOSABS 0.0 09/26/2014 1543    No results found for: "POCLITH", "LITHIUM"   No results found for: "PHENYTOIN", "PHENOBARB",  "VALPROATE", "CBMZ"   .res Assessment: Plan:    Attention deficit hyperactivity disorder (ADHD), predominantly inattentive type - Plan: amphetamine-dextroamphetamine (ADDERALL XR) 20 MG 24 hr capsule, amphetamine-dextroamphetamine (ADDERALL XR) 20 MG 24 hr capsule  Generalized anxiety disorder - Plan: escitalopram (LEXAPRO) 5 MG tablet  PTSD (post-traumatic stress disorder) - Plan: escitalopram (LEXAPRO) 5 MG tablet  Insomnia due to mental condition   60 min face to face time with patient was spent on counseling and coordination of care. We discussed the change and benefit from the increase in the Lexapro for the anxiety to 7.5 mg daily.  This is the maximum tolerated dosage previously.  Suspect residual anxiety is still a portion of what he's dealing with, but he's failed multiple meds for anxiety.  He's not sig depressed at this time.  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   Benefit from the Adderall for focus XR 20 mg 2 AM.   Satisfied with current dose   He doesn't want changes in that.  Doubt he would  get adequate duration with once daily Adderall XR.  Disc option to try Mydayis for longer duration.  Disc way to figure out if it contributes to sweating. Discussed potential benefits, risks, and side effects of stimulants with patient to include increased heart rate, palpitations, insomnia, increased anxiety, increased irritability, or decreased appetite.  Instructed patient to contact office if experiencing any significant tolerability issues. He is discussing cardiac issues and stimulants with cardiologist.   Had no improvement with sx when off the stimulants for 2 week.s  Sleep benefit from the Seroquel 75 mg is worth it. Plenty of sleep  20 min counseling about his anxiety over his heart health and reviewed the lab data and notes from card about this with him and how to interpret.  Disc gettting 3rd  opinion bc current data is suggesting CABG but this is extensive surgery with long recovery and disc risks it may not resolve sx that have been present fort many years.  `   Disc value of vitamin D for mental health and suggest trial of  5000U daily for the winter to see energy is a little better.  Trial lexapro to 15 mg for residual irritability.  He agrees.  FU 6 mos  Meredith Staggers, MD, DFAPA     Please see After Visit Summary for patient specific instructions.  Future Appointments  Date Time Provider Department Center  07/24/2023  3:30 PM [facility] 1 [facility] None  11/29/2023  1:30 PM Cottle, Steva Ready., MD [facility] None     No orders of the defined types were placed in this encounter.     -------------------------------

## 2023-06-06 ENCOUNTER — Other Ambulatory Visit: Payer: Self-pay | Admitting: Psychiatry

## 2023-06-06 ENCOUNTER — Other Ambulatory Visit: Payer: Self-pay | Admitting: Cardiology

## 2023-06-06 DIAGNOSIS — F4310 Post-traumatic stress disorder, unspecified: Secondary | ICD-10-CM

## 2023-06-06 DIAGNOSIS — F5105 Insomnia due to other mental disorder: Secondary | ICD-10-CM

## 2023-07-24 ENCOUNTER — Ambulatory Visit: Payer: No Typology Code available for payment source

## 2023-07-24 VITALS — BP 161/76 | HR 54 | Temp 98.1°F | Resp 16 | Ht 74.0 in | Wt 240.4 lb

## 2023-07-24 DIAGNOSIS — E785 Hyperlipidemia, unspecified: Secondary | ICD-10-CM | POA: Diagnosis not present

## 2023-07-24 MED ORDER — INCLISIRAN SODIUM 284 MG/1.5ML ~~LOC~~ SOSY
284.0000 mg | PREFILLED_SYRINGE | Freq: Once | SUBCUTANEOUS | Status: AC
  Administered 2023-07-24: 284 mg via SUBCUTANEOUS
  Filled 2023-07-24: qty 1.5

## 2023-07-24 NOTE — Progress Notes (Signed)
 Diagnosis: Hyperlipidemia  Provider:  Mannam, Praveen MD  Procedure: Injection  Leqvio  (inclisiran), Dose: 284 mg, Site: subcutaneous, Number of injections: 1  Injection Site(s): Left arm  Post Care: Patient declined observation  Discharge: Condition: Good, Destination: Home . AVS Provided  Performed by:  Rachelle Bue, RN

## 2023-07-27 ENCOUNTER — Encounter: Payer: Self-pay | Admitting: Cardiology

## 2023-09-08 ENCOUNTER — Other Ambulatory Visit: Payer: Self-pay | Admitting: Psychiatry

## 2023-09-08 DIAGNOSIS — F4310 Post-traumatic stress disorder, unspecified: Secondary | ICD-10-CM

## 2023-09-08 DIAGNOSIS — F5105 Insomnia due to other mental disorder: Secondary | ICD-10-CM

## 2023-10-05 ENCOUNTER — Telehealth: Payer: Self-pay

## 2023-10-05 NOTE — Telephone Encounter (Signed)
 Patient called and stated that he received a billing statement for his recent Leqvio  injection on 07/24/23. Requested if his Leqvio  copay card could please be applied. Provided the following information from his copay card:  ID # [id number] Bin: [id number] Group: [facility] PCN: [id number]  Patient requested a call back to confirm. Information forwarded to infusion clinical pharmacy team.  Rocky FORBES Sar, RN

## 2023-11-28 ENCOUNTER — Other Ambulatory Visit: Payer: Self-pay | Admitting: Psychiatry

## 2023-11-28 DIAGNOSIS — F411 Generalized anxiety disorder: Secondary | ICD-10-CM

## 2023-11-28 DIAGNOSIS — F4310 Post-traumatic stress disorder, unspecified: Secondary | ICD-10-CM

## 2023-11-29 ENCOUNTER — Encounter: Payer: Self-pay | Admitting: Psychiatry

## 2023-11-29 ENCOUNTER — Ambulatory Visit (INDEPENDENT_AMBULATORY_CARE_PROVIDER_SITE_OTHER): Admitting: Psychiatry

## 2023-11-29 DIAGNOSIS — F900 Attention-deficit hyperactivity disorder, predominantly inattentive type: Secondary | ICD-10-CM | POA: Diagnosis not present

## 2023-11-29 DIAGNOSIS — F411 Generalized anxiety disorder: Secondary | ICD-10-CM | POA: Diagnosis not present

## 2023-11-29 DIAGNOSIS — F4310 Post-traumatic stress disorder, unspecified: Secondary | ICD-10-CM | POA: Diagnosis not present

## 2023-11-29 DIAGNOSIS — F5105 Insomnia due to other mental disorder: Secondary | ICD-10-CM

## 2023-11-29 MED ORDER — QUETIAPINE FUMARATE 25 MG PO TABS: 75.0000 mg | ORAL_TABLET | Freq: Every day | ORAL | 2 refills | Status: AC

## 2023-11-29 MED ORDER — ESCITALOPRAM OXALATE 5 MG PO TABS: 15.0000 mg | ORAL_TABLET | Freq: Every day | ORAL | 2 refills | Status: AC

## 2023-11-29 NOTE — Progress Notes (Signed)
 Aaron Stout [id number] 03-21-1955 68 y.o.  Subjective:   Patient ID:  Aaron Stout is a 68 y.o. (DOB March 10, 1955) male.  Chief Complaint:  Chief Complaint  Patient presents with   Follow-up   Depression   Anxiety   ADD   Sleeping Problem    Aaron Stout presents to the office today for follow-up of mood and anxiety and sleep.  At visit in 2020had residual anxiety and it was suggested he disc with cardiologist the use of a beta blocker metoprolol  and it helped BP and  anxiety DT multiple failed anxiety meds.  Has been generally good.    At  visit 10/27/19  Lexapro  was increased from 5 to 7.5mg  dailty for anxiety which is chronic. Thinks the anxiety is a helpful.  I  Chronic anxiety and stress at work.  Evaluations pending at work.  Less obsessing about that.  Has tried 10mg  Lexapro  and he feels it was too much.  visit October 2020 without med changes.    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   Still CO sweating easily. Pretty good overall.  No major changes since here.  Same old chronic tiredness and easily OOB.  Still a little anxiety hangs on.  Not horrible and able to function and keep on going.    12/19/2019 appointment with the following noted: Fine overall.  Generally steady with mood and other factors. Chronic tiredness without change.  Trying to manage stress as much as possible. Stress Hudson back in the house and some lability continues with anger outbursts.  He always finds someone at work he doesn't like and starts ruminating on it. SE mild hangover. Mild hangover with Seroquel  which helps the sleep. Makes him sleepy in 60-90 mins. Chronic sweating and SOB. Plan: No med changes  06/18/2020 appointment with following noted: No Covid.   Knee surgery 12/21. Otherwise health is good.    02/25/21 appt noted: Taking lexapro  7.5 mg , Adderall and quetiapine  75 mg HS. No SE.  Sleep pretty  good. Tiredness worse in the day.  08/26/21  appt noted:  HT at [facility] has the Adderall. Benfit Adderall No SE  Overll doing ok with mood and anxity. Pt reports that mood is occ irritable and Anxious and describes anxiety as mild-moderate like if too many things coming at him at once.   Anxiety symptoms include: Excessive Worry,. Pt reports no sleep issues. Pt reports that appetite is good. Pt reports that energy is good and good. Concentration is good. Suicidal thoughts:  denied by patient Plan: no med changes  03/03/22 appt noted: Steady overall.   Still some hangover effects from Seroquel .   The HT version of Adderall is less effective than the [facility] version but supply problem. Recent dx angina with microvascular Dz.  On a couple of meds for that.  Cardiology aware he's taking Adderall XR 20 BID. Saw cardiology last week.  Just started Imdur  and so far less chest pain.   No SE otherwise and satisfied with meds. Plan: No med changes. 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  08/18/2022 appointment noted: Meds as above Some SOB and CP with 2nd opinion as to cause.  No clear answer yet.   Asked question as to how to deal with differences between first and second opinion.   Is in  some turmoil bc of the uncertainty as to what to do.  Some anxiety over this health concern.   No indication for med changes. Plan: rec increase lexapro  to 10 mg for ongoing anxiety.  He agrees.  11/29/22 appt noted: Hasn't been able to get Adderall XR 20 mg BID in last couple of weeks. Meds: Lexapro  10 mg daily, quetiapine  25 mg 3 tabs at night., out of Adderall  Was getting Adderall XR from Dr. Loreli bc easier to get from him than this office.   Still feels a little tired without it.  Focus has suffered some as well.   Wiped out by the day more.  Harder to focus when gets home.   Was satisfied with Adderall  XR 20 BID  No problem with increasing Lexapro  10 mg daily and thinks it was helpful.  Had neg stress test but he questions the results.  Has a question about it.  Doesn't trust the results.  BC he still has the CP and SOB sx.  SX probably a little more pronounced even off the Adderall. Has sciatica but didn't tolerate prednisone trial bc felt wiped out by it and it caused more chest pain and SOB when working.    05/30/23 appt noted: Med: Adderall for focus XR 20 mg 2 AM. Lexapro  10 mg daily, quetiapine  25 mg 3 tabs at night., Still working.  Pretty good overall without much change.  Same old tiredness and tendency to sleepiness. Satisfied with meds.  Benefit sleep with quetiapine . Anxiety better in general than in the past.  No longer chest pain driving to work.  Need to be more patient with work and wife. No sig SE Plan: Trial lexapro  to 15 mg for residual irritability.  He agrees.  11/29/23 appt noted:  Med: Adderall for focus XR 20 mg 2 AM. Lexapro  15 mg daily, quetiapine  25 mg 3 tabs at night., Benefit with increase Lexapro  15 mg daily for irritability. Tiredness increased but hypothyroid a little in July and started levothyroxine.   Disc spiritual disciplines that help him daily.   Doing ok with Adderall overall. Has some night sweats chronically.  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  He's aware and tries to catch it.    Past Psychiatric Medication Trials: Lexapro  10 SE, Zoloft,  Emsam,  Welllbutrin Adderall XR20 BID,  Ritalin , Concerta, Vyvanse, seroquel  75 for sleep, perphenazine, Depakote,  Vraylar 1.5, lithium 600, Trileptal, Buspar dizzy, lamotrigine,  Belsomra,   Review of Systems:  Review of Systems  Constitutional:  Positive for fatigue.       Long history of sweating  Respiratory:  Positive for shortness of breath.   Cardiovascular:  Positive for chest pain. Negative for palpitations.  Musculoskeletal:   Positive for joint swelling.  Neurological:  Negative for dizziness and tremors.  Psychiatric/Behavioral:  Positive for decreased concentration. Negative for agitation, behavioral problems, confusion, dysphoric mood, hallucinations, self-injury, sleep disturbance and suicidal ideas. The patient is nervous/anxious. The patient is not hyperactive.     Medications: I have reviewed the patient's current medications.  Current Outpatient Medications  Medication Sig Dispense Refill   albuterol  (PROVENTIL  HFA;VENTOLIN  HFA) 108 (90 Base) MCG/ACT inhaler Inhale 2 puffs into the lungs 2 (two) times daily. 1 Inhaler 3   amLODipine  (NORVASC ) 10 MG tablet TAKE 1 TABLET BY MOUTH EVERY DAY 90 tablet 7   amphetamine -dextroamphetamine (ADDERALL XR) 20 MG 24 hr capsule Take 1  capsule (20 mg total) by mouth 2 (two) times daily. 60 capsule 0   amphetamine -dextroamphetamine (ADDERALL XR) 20 MG 24 hr capsule Take 2 capsules (40 mg total) by mouth daily. 60 capsule 0   amphetamine -dextroamphetamine (ADDERALL XR) 20 MG 24 hr capsule Take 2 capsules (40 mg total) by mouth daily. 60 capsule 0   Ascorbic Acid (VITAMIN C WITH ROSE HIPS) 1000 MG tablet Take 1,000 mg by mouth in the morning and at bedtime.     aspirin  EC 81 MG tablet Take 1 tablet (81 mg total) by mouth daily. Swallow whole. 32 tablet 0   azelastine (ASTELIN) 0.1 % nasal spray Place 1 spray into both nostrils as needed for rhinitis. Use in each nostril as directed     B-12, Methylcobalamin, 1000 MCG SUBL Place 1,000 mcg under the tongue every evening.     bisoprolol  (ZEBETA ) 5 MG tablet TAKE 1/2 TABLET BY MOUTH DAILY 45 tablet 2   Cholecalciferol (VITAMIN D) 2000 UNITS tablet Take 3,000 Units by mouth daily.      Clobetasol Propionate 0.05 % shampoo Apply 0.05 % topically as needed.     clotrimazole-betamethasone (LOTRISONE) cream Apply 1 application topically 2 (two) times daily as needed (for psoriasis).     Coenzyme Q10 (CO Q-10) 100 MG CAPS Take 100 mg  by mouth daily.     colestipol  (COLESTID ) 5 g packet Take 10 g by mouth daily. (Patient taking differently: Take 7.5 g by mouth every evening.) 60 packet 5   colestipol  (COLESTID ) 5 g packet Take 7.5 g by mouth daily.     DHEA 25 MG CAPS Take 25 mg by mouth daily.      escitalopram  (LEXAPRO ) 5 MG tablet TAKE 3 TABLETS (15 MG TOTAL) BY MOUTH DAILY. 270 tablet 0   fexofenadine (ALLEGRA) 180 MG tablet Take 90 mg by mouth at bedtime.     fluticasone (FLONASE) 50 MCG/ACT nasal spray Place 1 spray into both nostrils as needed for allergies or rhinitis.     montelukast (SINGULAIR) 10 MG tablet Take 10 mg by mouth daily.     Multiple Vitamin (MULTIVITAMIN) tablet Take 1 tablet by mouth daily.     nystatin cream (MYCOSTATIN) Apply 1 Application topically as needed for dry skin.     Omega-3 Fatty Acids (FISH OIL) 1000 MG CAPS Take 2,000 mg by mouth 2 (two) times daily.      Omeprazole-Sodium Bicarbonate (ZEGERID) 20-1100 MG CAPS capsule Take 1 capsule by mouth 2 (two) times daily.      polycarbophil (FIBERCON) 625 MG tablet Take 5,000 mg by mouth daily.     Pregnenolone 50 MG TABS Take 100 mg by mouth daily.     QUERCETIN PO Take 1,000 mg by mouth in the morning and at bedtime.     QUEtiapine  (SEROQUEL ) 25 MG tablet TAKE 3 TABLETS BY MOUTH AT BEDTIME 270 tablet 0   Turmeric 500 MG CAPS Take 500 mg by mouth 2 (two) times daily. With Black Pepper     valACYclovir (VALTREX) 500 MG tablet Take 500 mg by mouth 2 (two) times daily.      WIXELA INHUB 100-50 MCG/ACT AEPB Inhale 1 puff into the lungs 2 (two) times daily.     No current facility-administered medications for this visit.    Medication Side Effects: None ? sweating  Allergies:  Allergies  Allergen Reactions   Duloxetine Hcl Other (See Comments)    Drowsiness  *Cymbalta   Ibuprofen Hives   Voltaren [Diclofenac] Shortness  Of Breath    Also had palpitations   Penicillins Other (See Comments)    Unknown Has patient had a PCN reaction  causing immediate rash, facial/tongue/throat swelling, SOB or lightheadedness with hypotension: Unknown Has patient had a PCN reaction causing severe rash involving mucus membranes or skin necrosis: Unknown Has patient had a PCN reaction that required hospitalization: Unknown Has patient had a PCN reaction occurring within the last 10 years: No If all of the above answers are NO, then may proceed with Cephalosporin use.  Childhood response    Dilaudid  [Hydromorphone  Hcl] Nausea And Vomiting   Simvastatin Other (See Comments)    Cramps    Past Medical History:  Diagnosis Date   Adenomatous colon polyp 2001   ALLERGIC RHINITIS 05/18/2007   Qualifier: Diagnosis of  By: Joshua RN, CGRN, Sheri     Anxiety    Arthritis 2013   arthritis-hands; Per PCPs notes-inflammatory arthritis   ASTHMA 05/18/2007   Qualifier: Diagnosis of  By: Joshua RN, CGRN, Sheri     Bipolar 1 disorder (HCC)    With depression   Cancer (HCC)    hx skin cancer   Chest tightness    Chronic fatigue syndrome    Chronic headaches    d/t old neck injury   Complication of anesthesia    x1 episode panic attack awakening-none in recent years   Coronary artery disease involving native coronary artery of native heart with other form of angina pectoris 06/2022   Cardiac catheter revealed ostial mid LAD 40% stenosis followed by a long segment after D1/SP1 all the way to SP2 - RFR 0.83 (physiologically significant ); tandem Prox & Mid Cx ~70% ~borderline RFR/GFR (0.9 & 0.8 -> reasonable to revascularize given symptoms)   Diverticulosis    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  No change.   GASTROESOPHAGEAL REFLUX DISEASE 05/18/2007   Qualifier: Diagnosis of  By: Joshua RN, CGRN, Sheri     HEADACHE, CHRONIC 05/18/2007   Qualifier: Diagnosis of  By: Joshua RN, CGRN, Sheri     Heart murmur    benign    History of degenerative disc disease    HOH (hard of hearing)     slight-bilateral   HSV-2 (herpes simplex virus 2) infection    Per PCP-genital   Hyperlipidemia 05/18/2007   Qualifier: Diagnosis of  By: Joshua RN, CGRN, Sheri     Hyperlipidemia due to dietary fat intake    Hypertension    IBS (irritable bowel syndrome)    Incidental lung nodule, > 3mm and < 8mm 2019   Stable is a 2022   Internal hemorrhoids with other complication 09/11/2013   Irritable bowel syndrome 05/18/2007   Qualifier: Diagnosis of  By: Joshua RN, CGRN, Sheri     Lyme disease    OSA (obstructive sleep apnea)    better s/p UPP-no problems now   Personal history of adenomatous colonic polyps 05/18/2007   2001, 2004, 2006, 2009 (last with one small adenoma) Max 4 polyps in past with largest 7 mm (both in 2004)  09/11/2013 7 small polyps adenomas  02/2017 5 adeoimas max 10 mm repeat colon 03/2020     PONV (postoperative nausea and vomiting)    Prostatitis    PROSTATITIS, CHRONIC 05/18/2007   Qualifier: Diagnosis of  By: Joshua RN, CGRN, Sheri     Radiculopathy of cervical spine    Small intestinal bacterial overgrowth (SIBO) ? 08/07/2022   Syncope  05/2008   1 episode-evaluated by cardiologist-negative findings-?related to low B/p (hydration tends to help).   Testicular hypofunction    Vitamin D deficiency     Family History  Problem Relation Age of Onset   Breast cancer Mother 64   Heart failure Father 10   Bowel Disease Father 68       Bowel obstruction   CAD Brother 43       PCI   Heart attack Brother 86   CAD Brother 60       PCI   Hepatitis B Brother    Heart attack Maternal Grandfather    Congestive Heart Failure Maternal Grandfather    COPD Maternal Grandfather    Heart disease Other        grandfather/uncle   Alcoholism Other        greatgrandfather/uncle     Colon polyps Other        uncle   Bipolar disorder Son    Ehlers-Danlos syndrome Son    Migraines Son    Colon cancer Neg Hx    Esophageal cancer Neg Hx    Rectal cancer Neg Hx    Stomach cancer  Neg Hx     Social History   Socioeconomic History   Marital status: Married    Spouse name: Not on file   Number of children: 2   Years of education: Not on file   Highest education level: Master's degree (e.g., MA, MS, MEng, MEd, MSW, MBA)  Occupational History   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  Tobacco Use   Smoking status: Former    Current packs/day: 0.00    Average packs/day: 0.5 packs/day for 7.0 years (3.5 ttl pk-yrs)    Types: Cigarettes    Start date: 03/07/1977    Quit date: 07/06/1980    Years since quitting: 43.4   Smokeless tobacco: Never  Vaping Use   Vaping status: Never Used  Substance and Sexual Activity   Alcohol use: No   Drug use: No   Sexual activity: Yes    Partners: Female  Other Topics Concern   Not on file  Social History Narrative   Married father of 84 sons   74 year old son graduated Therapist, sports & Lives at home with parents (bipolar)   8 year old son went to [employer] then [employer] as of May 2021, living at home       He has a MA in Psychologist, educational - & teaches Art @ [employer]   Social Drivers of Corporate investment banker Strain: Not on file  Food Insecurity: Not on file  Transportation Needs: Not on file  Physical Activity: Not on file  Stress: Not on file  Social Connections: Unknown (07/20/2021)   [facility]   Social Network    Social Network: Not on file  Intimate Partner Violence: Unknown (06/11/2021)   Received from [facility]   HITS    Physically Hurt: Not on file    Insult or Talk Down To: Not on file    Threaten Physical Harm: Not on file    Scream or Curse: Not on file    Past Medical History, Surgical history, Social history, and Family history were reviewed and updated as appropriate.   Jake dx bipolar on lithium with tremor.  Answered questions about this.  Please see review of systems for further details on the patient's review from today.   Objective:  Physical Exam:  There were no vitals taken for this visit.  Physical Exam Constitutional:      General: He is not in acute distress.    Appearance: He is well-developed.  Musculoskeletal:        General: No deformity.  Neurological:     Mental Status: He is alert and oriented to person, place, and time.     Motor: No tremor.     Coordination: Coordination normal.     Gait: Gait normal.  Psychiatric:        Attention and Perception: He is inattentive. He does not perceive auditory hallucinations.        Mood and Affect: Mood is anxious. Mood is not depressed. Affect is not labile, blunt, angry, tearful or inappropriate.        Speech: Speech normal.        Behavior: Behavior normal.        Thought Content: Thought content normal. Thought content is not delusional. Thought content does not include homicidal or suicidal ideation. Thought content does not include suicidal plan.        Cognition and Memory: Cognition normal.        Judgment: Judgment normal.     Comments: Insight intact. No auditory or visual hallucinations.  Irritability partly controlled    Get's SOB walking from one building to the next.  Lab Review:     Component Value Date/Time   NA 142 03/31/2023 1244   K 4.8 03/31/2023 1244   CL 103 03/31/2023 1244   CO2 24 03/31/2023 1244   GLUCOSE 89 03/31/2023 1244   GLUCOSE 103 (H) 09/12/2017 0556   BUN 23 03/31/2023 1244   CREATININE 1.16 03/31/2023 1244   CALCIUM  9.7 03/31/2023 1244   PROT 6.3 03/31/2023 1244   ALBUMIN 4.3 03/31/2023 1244   AST 28 03/31/2023 1244   ALT 34 03/31/2023 1244   ALKPHOS 55 03/31/2023 1244   BILITOT 0.3 03/31/2023 1244   GFRNONAA >60 09/12/2017 0556   GFRAA >60 09/12/2017 0556       Component Value Date/Time   WBC 10.8 06/20/2022 0831   WBC 8.7 09/12/2017 0556   RBC 4.64 06/20/2022 0831   RBC 4.62 09/12/2017 0556   HGB 14.5 06/20/2022 0831   HCT 42.6 06/20/2022 0831   PLT 219 06/20/2022 0831   MCV 92 06/20/2022 0831    MCH 31.3 06/20/2022 0831   MCH 31.4 09/12/2017 0556   MCHC 34.0 06/20/2022 0831   MCHC 33.2 09/12/2017 0556   RDW 13.2 06/20/2022 0831   LYMPHSABS 2.1 09/26/2014 1543   MONOABS 0.6 09/26/2014 1543   EOSABS 0.5 09/26/2014 1543   BASOSABS 0.0 09/26/2014 1543    Late October 05, 2023 TSH 4.68 , T4 1.0  Assessment: Plan:    Generalized anxiety disorder  Attention deficit hyperactivity disorder (ADHD), predominantly inattentive type  PTSD (post-traumatic stress disorder)  Insomnia due to mental condition   60 min face to face time with patient was spent on counseling and coordination of care..    Suspect residual anxiety is still a portion of what he's dealing with, but he's failed multiple meds for anxiety.  He's not sig depressed at this time.  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   Benefit from the Adderall for focus XR 20 mg 2 AM.   Getting from PCP>  Satisfied with current dose   He doesn't want changes  in that.  Doubt he would get adequate duration with once daily Adderall XR.  Disc option to try Mydayis for longer duration.  Disc way to figure out if it contributes to sweating. Discussed potential benefits, risks, and side effects of stimulants with patient to include increased heart rate, palpitations, insomnia, increased anxiety, increased irritability, or decreased appetite.  Instructed patient to contact office if experiencing any significant tolerability issues. He is discussing cardiac issues and stimulants with cardiologist.   Had no improvement with sx when off the stimulants for 2 week.s  Sleep benefit from the Seroquel  75 mg is worth it. Plenty of sleep  Disc value of vitamin D for mental health and suggest trial of  5000U daily for the winter to see energy is a little better.  Mildly hypothyroid and disc tx and disc levothyroxine and time to benefit.   Lexapro  to 15 mg for residual  irritability helped.  He agrees.  It was tolerated.   FU 9 mos  Lorene Macintosh, MD, DFAPA     Please see After Visit Summary for patient specific instructions.  Future Appointments  Date Time Provider Department Center  01/08/2024 11:30 AM [facility] None     No orders of the defined types were placed in this encounter.     -------------------------------

## 2023-12-03 ENCOUNTER — Other Ambulatory Visit: Payer: Self-pay | Admitting: Cardiology

## 2023-12-18 ENCOUNTER — Telehealth: Payer: Self-pay | Admitting: Pharmacy Technician

## 2023-12-18 NOTE — Telephone Encounter (Addendum)
 Auth Submission: no auth needed Site of care: Site of care: [facility] Payer: [employer] Medication & CPT/J Code(s) submitted: Leqvio  (Inclisiran) [id number] Route of submission (phone, fax, portal):  Phone # [phone] EXT [phone] MAIN PHONE: [phone] Fax #[phone] Auth type: Buy/Bill PB Units/visits requested: 2 DOSES Reference number:

## 2024-01-08 ENCOUNTER — Ambulatory Visit

## 2024-01-08 MED ORDER — INCLISIRAN SODIUM 284 MG/1.5ML ~~LOC~~ SOSY: 284.0000 mg | PREFILLED_SYRINGE | Freq: Once | SUBCUTANEOUS | Status: DC

## 2024-01-09 ENCOUNTER — Encounter: Payer: Self-pay | Admitting: Cardiology

## 2024-01-25 ENCOUNTER — Telehealth: Payer: Self-pay | Admitting: Pharmacy Technician

## 2024-01-25 ENCOUNTER — Encounter: Payer: Self-pay | Admitting: Cardiology

## 2024-01-25 ENCOUNTER — Ambulatory Visit

## 2024-01-25 NOTE — Telephone Encounter (Signed)
 Auth Submission: NO AUTH NEEDED Site of care: Site of care: [facility] Payer: MEDCOST Medication & CPT/J Code(s) submitted: Leqvio  (Inclisiran) J1306 Diagnosis Code: E78.5 Route of submission (phone, fax, portal): FAXED Phone #[phone] Fax #[phone] Auth type: Buy/Bill PB Units/visits requested: 284MG  Q6 MONTHS Reference number: [id number] SUPERVISORE Approval from: 01/25/24 to 01/24/25   Patient previously had approval on file 7/24 - 7/25 which expired.  I faxed a continuation of therapy to fax: [phone].  Called to f/u on auth and was told no auth or predetermination was needed.   After several transfers I spoke with supervisor Roxie and was transferred to correct department which confirmed no auth was needed for buy/bill.

## 2024-02-04 ENCOUNTER — Encounter: Payer: Self-pay | Admitting: Cardiology

## 2024-02-05 MED ORDER — BISOPROLOL FUMARATE 5 MG PO TABS: 5.0000 mg | ORAL_TABLET | Freq: Every day | ORAL | 2 refills | Status: AC

## 2024-02-07 ENCOUNTER — Telehealth: Payer: Self-pay

## 2024-02-07 NOTE — Telephone Encounter (Signed)
 Auth Submission: APPROVED - renewal Site of care: Site of care: [facility] Payer: [employer]cost Medication & CPT/J Code(s) submitted: Leqvio  (Inclisiran) J1306 Diagnosis Code:  Route of submission (phone, fax, portal): fax Phone # Fax # Auth type: Buy/Bill PB Units/visits requested: 284mg  x 2 doses Reference number: [id number] Approval from: 02/06/24 to 02/05/25

## 2024-02-13 ENCOUNTER — Encounter: Payer: Self-pay | Admitting: Cardiology

## 2024-02-13 ENCOUNTER — Ambulatory Visit

## 2024-02-13 VITALS — BP 121/68 | HR 61 | Temp 97.6°F | Resp 12 | Ht 73.5 in | Wt 241.0 lb

## 2024-02-13 DIAGNOSIS — E785 Hyperlipidemia, unspecified: Secondary | ICD-10-CM

## 2024-02-13 MED ORDER — INCLISIRAN SODIUM 284 MG/1.5ML ~~LOC~~ SOSY
284.0000 mg | PREFILLED_SYRINGE | Freq: Once | SUBCUTANEOUS | Status: AC
  Administered 2024-02-13: 284 mg via SUBCUTANEOUS
  Filled 2024-02-13: qty 1.5

## 2024-02-13 NOTE — Progress Notes (Signed)
 Diagnosis: Hyperlipidemia  Provider:  Mannam, Praveen MD  Procedure: Injection  Leqvio  (inclisiran), Dose: 284 mg, Site: subcutaneous, Number of injections: 1  Injection Site(s): Left arm  Post Care: Patient declined observation  Discharge: Condition: Stable, Destination: Home . AVS Declined  Performed by:  Rocky FORBES Sar, RN

## 2024-03-18 ENCOUNTER — Ambulatory Visit: Attending: Cardiology | Admitting: Cardiology

## 2024-03-18 VITALS — BP 138/64 | HR 59 | Ht 73.5 in | Wt 245.0 lb

## 2024-03-18 DIAGNOSIS — I77810 Thoracic aortic ectasia: Secondary | ICD-10-CM

## 2024-03-18 DIAGNOSIS — R002 Palpitations: Secondary | ICD-10-CM | POA: Diagnosis not present

## 2024-03-18 DIAGNOSIS — T466X5D Adverse effect of antihyperlipidemic and antiarteriosclerotic drugs, subsequent encounter: Secondary | ICD-10-CM

## 2024-03-18 DIAGNOSIS — I200 Unstable angina: Secondary | ICD-10-CM

## 2024-03-18 DIAGNOSIS — E785 Hyperlipidemia, unspecified: Secondary | ICD-10-CM | POA: Diagnosis not present

## 2024-03-18 DIAGNOSIS — I25119 Atherosclerotic heart disease of native coronary artery with unspecified angina pectoris: Secondary | ICD-10-CM | POA: Diagnosis not present

## 2024-03-18 DIAGNOSIS — I10 Essential (primary) hypertension: Secondary | ICD-10-CM

## 2024-03-18 DIAGNOSIS — I209 Angina pectoris, unspecified: Secondary | ICD-10-CM

## 2024-03-18 DIAGNOSIS — G720 Drug-induced myopathy: Secondary | ICD-10-CM

## 2024-03-18 DIAGNOSIS — T466X5A Adverse effect of antihyperlipidemic and antiarteriosclerotic drugs, initial encounter: Secondary | ICD-10-CM

## 2024-03-18 MED ORDER — RANOLAZINE ER 500 MG PO TB12: 500.0000 mg | ORAL_TABLET | Freq: Two times a day (BID) | ORAL | 5 refills | Status: AC

## 2024-03-18 NOTE — Patient Instructions (Addendum)
 Medication Instructions:   Starting Ranexa  ( generic) 500 mg  Directions-- start taking 1/2 tablet twice a day  for 2 weeks   then increase to tablet in the morning  and 1/2 tablet in the evening for one week  then increase to one tablet  twice a day   *If you need a refill on your cardiac medications before your next appointment, please call your pharmacy*   Lab Work: Not needed    Testing/Procedures: Recommend you having a PET SCAN at [facility]  .A cardiac PET scan is an accurate, noninvasive test that creates images of your heart. A healthcare provider can use these images to make decisions about the next steps in your heart care. They can judge how healthy your heart muscle is and decide how to treat it. A cardiac PET scan uses a small amount of radiation    The schedulers will call you to get this scheduled. Please follow further testing instructions below.   Follow-Up: At [facility], you and your health needs are our priority.  As part of our continuing mission to provide you with exceptional heart care, we have created designated Provider Care Teams.  These Care Teams include your primary Cardiologist (physician) and Advanced Practice Providers (APPs -  Physician Assistants and Nurse Practitioners) who all work together to provide you with the care you need, when you need it.     Your next appointment:   3 month(s)  The format for your next appointment:   In Person  Provider:   Alm Clay, MD   Other Instructions         Please report to Radiology at the [facility] Main Entrance 30 minutes early for your test.  [address] [place], [place] [address]     How to Prepare for Your Cardiac PET/CT Stress Test:  Nothing to eat or drink, except water, 3 hours prior to arrival time.  NO caffeine/decaffeinated products, or chocolate 12 hours prior to arrival. (Please note decaffeinated beverages (teas/coffees) still contain caffeine).   If you have caffeine within 12 hours prior, the test will need to be rescheduled.  Medication instructions: Do not take erectile dysfunction medications for 72 hours prior to test (sildenafil, tadalafil) Do not take nitrates (isosorbide  mononitrate, Ranexa ) the day before or day of test Do not take tamsulosin the day before or morning of test Hold theophylline containing medications for 12 hours. Hold Dipyridamole 48 hours prior to the test.    You may take your remaining medications with water.  NO perfume, cologne or lotion on chest or abdomen area.   Total time is 1 to 2 hours; you may want to bring reading material for the waiting time.    In preparation for your appointment, medication and supplies will be purchased.  Appointment availability is limited, so if you need to cancel or reschedule, please call the Radiology Department Scheduler at [phone] 24 hours in advance to avoid a cancellation fee of $100.00  What to Expect When you Arrive:  Once you arrive and check in for your appointment, you will be taken to a preparation room within the Radiology Department.  A technologist or Nurse will obtain your medical history, verify that you are correctly prepped for the exam, and explain the procedure.  Afterwards, an IV will be started in your arm and electrodes will be placed on your skin for EKG monitoring during the stress portion of the exam. Then you will be  escorted to the PET/CT scanner.  There, staff will get you positioned on the scanner and obtain a blood pressure and EKG.  During the exam, you will continue to be connected to the EKG and blood pressure machines.  A small, safe amount of a radioactive tracer will be injected in your IV to obtain a series of pictures of your heart along with an injection of a stress agent.    After your Exam:  It is recommended that you eat a meal and drink a caffeinated beverage to counter act any effects of the stress agent.  Drink  plenty of fluids for the remainder of the day and urinate frequently for the first couple of hours after the exam.  Your doctor will inform you of your test results within 7-10 business days.  For more information and frequently asked questions, please visit our website: [email or web address]  For questions about your test or how to prepare for your test, please call: Cardiac Imaging Nurse Navigators Office: [phone]

## 2024-03-18 NOTE — Progress Notes (Unsigned)
 " Cardiology Office Note:  .   Date:  03/19/2024  ID:  Aaron Stout, DOB Jan 31, 1956, MRN [id number] PCP: Loreli Elsie JONETTA Mickey., MD  [facility] Providers Cardiologist:  Alm Clay, MD     Chief Complaint  Patient presents with   Follow-up    Chest pain and dyspnea is back   Coronary Artery Disease    Now having recurrent chest pain and shortness of breath.    Patient Profile: .     Aaron Stout is a very anxious 69 y.o. male  with a PMH notable for moderate two-vessel CAD (borderline positive lesions in LAD and LCx treated medically)., HLD (statin myalgias), and HTN  who presents here for annual follow-up at the request of Loreli Elsie JONETTA Mickey., MD.  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  He had a Myoview  stress test done in [place] that was nonischemic.  We decided to put him on 10 mg amlodipine  plus Zebeta  2.5 mg daily with additional 2.5 mg as necessary.  Reviewed also been started on  In November he called stating that he was actually taking Zebeta  just about every day so he wrote for daily is it better.      Subjective  Discussed the use of AI scribe software for clinical note transcription with the patient, who gave verbal consent to proceed.  History of Present Illness Aaron Stout is a 69 year old male with coronary artery disease who presents with recurrence of chest pain, shortness of breath, and fatigue.  He has experienced a recurrence of chest pain, shortness of breath, and fatigue over the past three to four months. The chest pain is described as a tightening sensation in the chest, primarily occurring during walking and accompanied by shortness of breath. These symptoms have  returned despite previously being well-controlled. He increased his Zykadia dose from 2.5 mg to 5 mg daily in an attempt to manage these symptoms, but this has not provided relief.  He experiences significant fatigue, sometimes requiring up to thirteen hours of sleep per day, which he finds unusual and concerning. Wearing a mask causes him to cough, which he attributes to the fibers in the mask.  His current medications include aspirin  81 mg, Zbetta 5 mg daily, amlodipine  10 mg daily, Leqvio  injections every six months, CoQ10, omega-3 fatty acids 4000 mg daily, Singulair 10 mg daily, Synthroid 25 mcg daily, Lexapro  15 mg daily, colestipol  5 g daily, Adderall 40 mg daily, Wixela one puff twice daily, pregnenolone, and Seroquel  75 mg at night. He has reduced his Synthroid dose from 50 mcg to 25 mcg due to side effects and takes colestipol  at a lower dose than prescribed to manage side effects.  His cholesterol levels were checked in July, with a total cholesterol of 106, HDL of 43, LDL of 30, triglycerides of 166, and A1c of 5.6.  During the review of symptoms, he confirms experiencing shortness of breath and chest pain with exertion, which limits his ability to walk at a normal pace. No recent use of albuterol , as he finds Wixela sufficient for managing his respiratory symptoms.  Cardiovascular ROS: positive for - chest pain, dyspnea on exertion, palpitations, and exercise intolerance/fatigue, had initially been better but now back. negative  for - orthopnea, paroxysmal nocturnal dyspnea, rapid heart rate, shortness of breath, or lightheadedness, dizziness or wooziness, syncope or near syncope TIA or amaurosis fugax, claudication  ROS:  Review of Systems - he is wheezing symptoms are pretty well-controlled with his Wixela inhaler so he is not having to use his albuterol .    Objective   Active Medications[1] Leqvio   Studies Reviewed: SABRA   EKG Interpretation Date/Time:  Monday March 18 2024  15:33:21 EST Ventricular Rate:  59 PR Interval:  154 QRS Duration:  96 QT Interval:  428 QTC Calculation: 423 R Axis:   -49  Text Interpretation: Sinus bradycardia Left axis deviation Minimal voltage criteria for LVH, may be normal variant ( R in aVL ) When compared with ECG of 24-Jun-2022 08:58, Premature atrial complexes are no longer Present Confirmed by Anner Lenis ([id number]) on 03/18/2024 3:58:16 PM    Lab Results  Component Value Date   CHOL 105 03/31/2023   HDL 44 03/31/2023   LDLCALC 36 03/31/2023   LDLDIRECT 180.6 07/18/2011   TRIG 143 03/31/2023   CHOLHDL 2.4 03/31/2023   Lab Results  Component Value Date   NA 142 03/31/2023   K 4.8 03/31/2023   CREATININE 1.16 03/31/2023   EGFR 69 03/31/2023   GLUCOSE 89 03/31/2023   No results found for: HGBA1C  Results Labs Total cholesterol (09/27/2023): 106 HDL (09/27/2023): 43 LDL (92/76/7974): 30 Triglycerides (09/27/2023): 166 Hemoglobin A1c (09/27/2023): 5.6  Diagnostic  ECHO: 06/24/2022: Normal LVEF 55 to 60%.  No RWMA.  G1 DD.  Moderate LA dilation.  Normal RV size and function.  Normal aortic and mitral valves.  Ascending measured 43 mm.  RAP estimated 15 mm.  CATH: 06/24/2022: Ostial to mid LAD roughly 40% with RFR of 0.83 apically.  Proximal LCx to 70% with RFR 0.93.  Mid LCx 65% with an FFR of 0.81 (borderline significant.  Ostial proximal LCx 30%.  Normal RCA.     Myoview  [facility] - 10/2022)  He had a ordered which showed no evidence of ischemia (which is not necessarily unexpected based on Findings)    Risk Assessment/Calculations:             Physical Exam:   VS:  BP 138/64 (BP Location: Right Arm, Patient Position: Sitting, Cuff Size: Large)   Pulse (!) 59   Ht 6' 1.5 (1.867 m)   Wt 245 lb (111.1 kg)   SpO2 96%   BMI 31.89 kg/m    Wt Readings from Last 3 Encounters:  03/18/24 245 lb (111.1 kg)  02/13/24 241 lb (109.3 kg)  07/24/23 240 lb 6.4 oz (109 kg)      GEN: Well  nourished but mildly obese.  Well-groomed; otherwise healthy-appearing.  In no acute distress; mildly obese NECK: No JVD; No carotid bruits CARDIAC: Normal S1, split S2; RRR, no murmurs, rubs, gallops RESPIRATORY:  Clear to auscultation without rales, wheezing or rhonchi ; nonlabored, good air movement. ABDOMEN: Soft, non-tender, non-distended EXTREMITIES:  No edema; No deformity      ASSESSMENT AND PLAN: .   Coronary artery disease involving native coronary artery with angina pectoris Coronary artery disease with class III angina pectoris Progressive angina with increased fatigue, shortness of breath, and chest pain. Differential includes coronary artery disease progression versus microvascular disease. Previous nuclear stress test showed no major artery flow issues.  Stress PET considered for small vessel disease and myocardial blood flow reserve assessment.  Patient has high risk features with known CAD of borderline significance  but no recent Myoview  was nonischemic.  Need to evaluate for either macro versus microvascular disease with stress PET being the best option. Discussed Ranexa  for angina management, potential side effects noted. Emphasized confirming need for invasive procedures. - Ordered stress PET to assess for small vessel disease and myocardial blood flow reserve.  See informed consent/shared decision making below. - Initiated Ranexa  500 mg twice daily, starting with half a tablet twice daily for two weeks, then increase to full dose twice daily. - Instructed to withhold masoprocol on the morning of the stress PET test. - Consider further invasive procedures if stress PET indicates significant vessel blockage.  Angina, class III After previously being pretty well-controlled on combination of amlodipine  and the higher dose bisoprolol , he is now back to having symptoms again.  Now having exertional dyspnea and chest tightness similar to previous episodes.  Last exertion.  More  exercise intolerance.  We discussed Plans to Proceed with Cardiac Stress PET to evaluate for either macro versus microvascular disease.  If indicated would proceed with ischemic evaluation. Adding Ranexa  500 mg twice daily titrating up from starting at 250 mg twice daily  Essential hypertension Blood pressure is borderline today.  We do have room to potentially increase afterload reduction by adding ARB but Brabham max dose of amlodipine  10 mg and with heart rate of 59 cannot titrate beta-blocker (Zebeta  5 mg) any further. - For now, continue current dose of Zebeta  5 mg daily and amlodipine  10 mg daily - Reassess BP in follow-up, low threshold to add ARB for additional afterload reduction  Thoracic aortic ectasia Previously measured thoracic ectasia at 4.2 cm.  Plan will be to follow-up CT scan either this year or next.  Hyperlipidemia with target low density lipoprotein (LDL) cholesterol less than 55 mg/dL Hyperlipidemia with target LDL cholesterol less than 55 mg/dL LDL cholesterol well controlled with Leqvio  injections. Recent labs show LDL at 30 mg/dL. - Continue Leqvio  injections every six months.  Palpitation Well-controlled with current dose of Zebeta  5 mg daily.  Statin myopathy Failed multiple statins.  (Including simvastatin, lovastatin, atorvastatin  and rosuvastatin).  Had memory issues and myalgias). Started on Leqvio .  Doing well.   Orders Placed This Encounter  Procedures   NM PET CT CARDIAC PERFUSION MULTI W/ABSOLUTE BLOODFLOW   Cardiac Stress Test: Informed Consent Details: Physician/Practitioner Attestation; Transcribe to consent form and obtain patient signature   EKG 12-Lead         Informed Consent   Shared Decision Making/Informed Consent The risks [chest pain, shortness of breath, cardiac arrhythmias, dizziness, blood pressure fluctuations, myocardial infarction, stroke/transient ischemic attack, nausea, vomiting, allergic reaction, radiation exposure,  metallic taste sensation and life-threatening complications (estimated to be 1 in 10,000)], benefits (risk stratification, diagnosing coronary artery disease, treatment guidance) and alternatives of a cardiac PET stress test were discussed in detail with Mr. Petroni and he agrees to proceed.      Follow-Up: Return for 3-4 month follow-up, Routine follow up with me, To discuss test results.  I spent 61 minutes in the care of GRAEME MENEES today including reviewing labs (1 minute), reviewing outside labs from [facility] (1 minute), reviewing studies (both cardiac cath films, and echocardiogram reviewed-6 minutes), reviewing outside studies (reviewed outside Myoview  results-2 minutes), face to face time discussing treatment options (31 minutes), reviewing records from my previous clinic notes as well as second opinion note from [place] (7 minutes), 13 minutes dictating, and documenting in the encounter.      Signed, Alm MICAEL Clay,  MD, MS Alm Clay, M.D., M.S. Interventional Cardiologist  [facility] Pager # [phone]          [1]  Current Meds  Medication Sig   albuterol  (PROVENTIL  HFA;VENTOLIN  HFA) 108 (90 Base) MCG/ACT inhaler Inhale 2 puffs into the lungs 2 (two) times daily.   amLODipine  (NORVASC ) 10 MG tablet TAKE 1 TABLET BY MOUTH EVERY DAY   amphetamine -dextroamphetamine (ADDERALL XR) 20 MG 24 hr capsule Take 2 capsules (40 mg total) by mouth daily.   Ascorbic Acid (VITAMIN C WITH ROSE HIPS) 1000 MG tablet Take 1,000 mg by mouth in the morning and at bedtime.   aspirin  EC 81 MG tablet Take 1 tablet (81 mg total) by mouth daily. Swallow whole.   azelastine (ASTELIN) 0.1 % nasal spray Place 1 spray into both nostrils as needed for rhinitis. Use in each nostril as directed   B-12, Methylcobalamin, 1000 MCG SUBL Place 1,000 mcg under the tongue every evening.   bisoprolol  (ZEBETA ) 5 MG tablet Take 1 tablet (5 mg total) by mouth daily.   Cholecalciferol (VITAMIN D)  2000 UNITS tablet Take 3,000 Units by mouth daily.    Clobetasol Propionate 0.05 % shampoo Apply 0.05 % topically as needed.   clotrimazole-betamethasone (LOTRISONE) cream Apply 1 application topically 2 (two) times daily as needed (for psoriasis).   Coenzyme Q10 (CO Q-10) 100 MG CAPS Take 100 mg by mouth daily.   colestipol  (COLESTID ) 5 g packet Take 7.5 g by mouth daily.   DHEA 25 MG CAPS Take 25 mg by mouth daily.    escitalopram  (LEXAPRO ) 5 MG tablet Take 3 tablets (15 mg total) by mouth daily.   fexofenadine (ALLEGRA) 180 MG tablet Take 90 mg by mouth at bedtime.   fluticasone (FLONASE) 50 MCG/ACT nasal spray Place 1 spray into both nostrils as needed for allergies or rhinitis.   levothyroxine (SYNTHROID) 50 MCG tablet Take 50 mcg by mouth daily before breakfast.   montelukast (SINGULAIR) 10 MG tablet Take 10 mg by mouth daily.   Multiple Vitamin (MULTIVITAMIN) tablet Take 1 tablet by mouth daily.   nystatin cream (MYCOSTATIN) Apply 1 Application topically as needed for dry skin.   Omega-3 Fatty Acids (FISH OIL) 1000 MG CAPS Take 2,000 mg by mouth 2 (two) times daily.    Omeprazole-Sodium Bicarbonate (ZEGERID) 20-1100 MG CAPS capsule Take 1 capsule by mouth 2 (two) times daily.    polycarbophil (FIBERCON) 625 MG tablet Take 5,000 mg by mouth daily.   Pregnenolone 50 MG TABS Take 100 mg by mouth daily.   QUERCETIN PO Take 1,000 mg by mouth in the morning and at bedtime.   QUEtiapine  (SEROQUEL ) 25 MG tablet Take 3 tablets (75 mg total) by mouth at bedtime.   ranolazine  (RANEXA ) 500 MG 12 hr tablet Take 1 tablet (500 mg total) by mouth 2 (two) times daily.   Turmeric 500 MG CAPS Take 500 mg by mouth 2 (two) times daily. With Black Pepper   valACYclovir (VALTREX) 500 MG tablet Take 500 mg by mouth 2 (two) times daily.    WIXELA INHUB 100-50 MCG/ACT AEPB Inhale 1 puff into the lungs 2 (two) times daily.   "

## 2024-03-19 ENCOUNTER — Encounter: Payer: Self-pay | Admitting: Cardiology

## 2024-03-19 NOTE — Assessment & Plan Note (Signed)
 After previously being pretty well-controlled on combination of amlodipine  and the higher dose bisoprolol , he is now back to having symptoms again.  Now having exertional dyspnea and chest tightness similar to previous episodes.  Last exertion.  More exercise intolerance.  We discussed Plans to Proceed with Cardiac Stress PET to evaluate for either macro versus microvascular disease.  If indicated would proceed with ischemic evaluation. Adding Ranexa  500 mg twice daily titrating up from starting at 250 mg twice daily

## 2024-03-19 NOTE — Assessment & Plan Note (Signed)
 Blood pressure is borderline today.  We do have room to potentially increase afterload reduction by adding ARB but Brabham max dose of amlodipine  10 mg and with heart rate of 59 cannot titrate beta-blocker (Zebeta  5 mg) any further. - For now, continue current dose of Zebeta  5 mg daily and amlodipine  10 mg daily - Reassess BP in follow-up, low threshold to add ARB for additional afterload reduction

## 2024-03-19 NOTE — Assessment & Plan Note (Signed)
 Hyperlipidemia with target LDL cholesterol less than 55 mg/dL LDL cholesterol well controlled with Leqvio  injections. Recent labs show LDL at 30 mg/dL. - Continue Leqvio  injections every six months.

## 2024-03-19 NOTE — Assessment & Plan Note (Signed)
 Previously measured thoracic ectasia at 4.2 cm.  Plan will be to follow-up CT scan either this year or next.

## 2024-03-19 NOTE — Assessment & Plan Note (Signed)
 Coronary artery disease with class III angina pectoris Progressive angina with increased fatigue, shortness of breath, and chest pain. Differential includes coronary artery disease progression versus microvascular disease. Previous nuclear stress test showed no major artery flow issues.  Stress PET considered for small vessel disease and myocardial blood flow reserve assessment.  Patient has high risk features with known CAD of borderline significance but no recent Myoview  was nonischemic.  Need to evaluate for either macro versus microvascular disease with stress PET being the best option. Discussed Ranexa  for angina management, potential side effects noted. Emphasized confirming need for invasive procedures. - Ordered stress PET to assess for small vessel disease and myocardial blood flow reserve.  See informed consent/shared decision making below. - Initiated Ranexa  500 mg twice daily, starting with half a tablet twice daily for two weeks, then increase to full dose twice daily. - Instructed to withhold masoprocol on the morning of the stress PET test. - Consider further invasive procedures if stress PET indicates significant vessel blockage.

## 2024-03-19 NOTE — Assessment & Plan Note (Signed)
 Well-controlled with current dose of Zebeta  5 mg daily.

## 2024-03-19 NOTE — Assessment & Plan Note (Signed)
 Failed multiple statins.  (Including simvastatin, lovastatin, atorvastatin  and rosuvastatin).  Had memory issues and myalgias). Started on Leqvio .  Doing well.

## 2024-05-08 ENCOUNTER — Other Ambulatory Visit (HOSPITAL_COMMUNITY)

## 2024-06-19 ENCOUNTER — Ambulatory Visit: Admitting: Cardiology

## 2024-07-25 ENCOUNTER — Ambulatory Visit: Admitting: Psychiatry

## 2024-08-14 ENCOUNTER — Ambulatory Visit
